# Patient Record
Sex: Female | Born: 2000
Health system: Southern US, Community
[De-identification: ages and names within clinical notes are randomized; demographics above are authoritative.]

## PROBLEM LIST (undated history)

## (undated) DIAGNOSIS — K5900 Constipation, unspecified: Secondary | ICD-10-CM

## (undated) DIAGNOSIS — K219 Gastro-esophageal reflux disease without esophagitis: Secondary | ICD-10-CM

## (undated) DIAGNOSIS — F32A Depression, unspecified: Secondary | ICD-10-CM

## (undated) DIAGNOSIS — E119 Type 2 diabetes mellitus without complications: Secondary | ICD-10-CM

## (undated) DIAGNOSIS — F419 Anxiety disorder, unspecified: Secondary | ICD-10-CM

## (undated) HISTORY — DX: Type 2 diabetes mellitus without complications: E11.9

## (undated) HISTORY — PX: MOUTH SURGERY: SHX715

## (undated) HISTORY — PX: ADENOIDECTOMY: SUR15

## (undated) HISTORY — PX: TONSILLECTOMY: SUR1361

---
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
## 2018-01-06 ENCOUNTER — Encounter: Payer: Self-pay | Admitting: Orthopedic Surgery

## 2018-05-21 ENCOUNTER — Ambulatory Visit (INDEPENDENT_AMBULATORY_CARE_PROVIDER_SITE_OTHER): Payer: BLUE CROSS/BLUE SHIELD | Admitting: Physician Assistant

## 2018-05-21 ENCOUNTER — Encounter: Payer: Self-pay | Admitting: Physician Assistant

## 2018-05-21 VITALS — BP 122/80 | HR 108 | Temp 98.0°F | Ht 67.02 in | Wt 206.6 lb

## 2018-05-21 DIAGNOSIS — E118 Type 2 diabetes mellitus with unspecified complications: Secondary | ICD-10-CM | POA: Diagnosis not present

## 2018-05-21 DIAGNOSIS — G43909 Migraine, unspecified, not intractable, without status migrainosus: Secondary | ICD-10-CM | POA: Insufficient documentation

## 2018-05-21 DIAGNOSIS — Z202 Contact with and (suspected) exposure to infections with a predominantly sexual mode of transmission: Secondary | ICD-10-CM | POA: Diagnosis not present

## 2018-05-21 DIAGNOSIS — R739 Hyperglycemia, unspecified: Secondary | ICD-10-CM | POA: Diagnosis not present

## 2018-05-21 DIAGNOSIS — G43809 Other migraine, not intractable, without status migrainosus: Secondary | ICD-10-CM | POA: Diagnosis not present

## 2018-05-21 DIAGNOSIS — K219 Gastro-esophageal reflux disease without esophagitis: Secondary | ICD-10-CM

## 2018-05-21 HISTORY — DX: Migraine, unspecified, not intractable, without status migrainosus: G43.909

## 2018-05-21 LAB — BAYER DCA HB A1C WAIVED: HB A1C (BAYER DCA - WAIVED): 6.9 % (ref <7.0)

## 2018-05-21 MED ORDER — RANITIDINE HCL 150 MG PO TABS: 150.0000 mg | ORAL_TABLET | Freq: Two times a day (BID) | ORAL | 11 refills | Status: DC

## 2018-05-21 MED ORDER — SUMATRIPTAN SUCCINATE 50 MG PO TABS: 50.0000 mg | ORAL_TABLET | ORAL | 1 refills | Status: DC | PRN

## 2018-05-22 LAB — CMP14+EGFR
ALT: 16 IU/L (ref 0–32)
AST: 21 IU/L (ref 0–40)
Albumin/Globulin Ratio: 2 (ref 1.2–2.2)
Albumin: 5 g/dL (ref 3.5–5.5)
Alkaline Phosphatase: 92 IU/L (ref 43–101)
BUN/Creatinine Ratio: 10 (ref 9–23)
BUN: 9 mg/dL (ref 6–20)
Bilirubin Total: 0.2 mg/dL (ref 0.0–1.2)
CO2: 18 mmol/L — ABNORMAL LOW (ref 20–29)
Calcium: 9.8 mg/dL (ref 8.7–10.2)
Chloride: 100 mmol/L (ref 96–106)
Creatinine, Ser: 0.88 mg/dL (ref 0.57–1.00)
GFR calc Af Amer: 111 mL/min/{1.73_m2} (ref >59)
GFR calc non Af Amer: 96 mL/min/{1.73_m2} (ref >59)
Globulin, Total: 2.5 g/dL (ref 1.5–4.5)
Glucose: 167 mg/dL — ABNORMAL HIGH (ref 65–99)
Potassium: 4.6 mmol/L (ref 3.5–5.2)
Sodium: 138 mmol/L (ref 134–144)
Total Protein: 7.5 g/dL (ref 6.0–8.5)

## 2018-05-22 LAB — CBC WITH DIFFERENTIAL/PLATELET
Basophils Absolute: 0.1 10*3/uL (ref 0.0–0.2)
Basos: 1 %
EOS (ABSOLUTE): 0.3 10*3/uL (ref 0.0–0.4)
Eos: 2 %
Hematocrit: 42.6 % (ref 34.0–46.6)
Hemoglobin: 14 g/dL (ref 11.1–15.9)
Immature Grans (Abs): 0 10*3/uL (ref 0.0–0.1)
Immature Granulocytes: 0 %
Lymphocytes Absolute: 4.4 10*3/uL — ABNORMAL HIGH (ref 0.7–3.1)
Lymphs: 33 %
MCH: 26.9 pg (ref 26.6–33.0)
MCHC: 32.9 g/dL (ref 31.5–35.7)
MCV: 82 fL (ref 79–97)
Monocytes Absolute: 1 10*3/uL — ABNORMAL HIGH (ref 0.1–0.9)
Monocytes: 8 %
Neutrophils Absolute: 7.5 10*3/uL — ABNORMAL HIGH (ref 1.4–7.0)
Neutrophils: 56 %
Platelets: 289 10*3/uL (ref 150–450)
RBC: 5.21 x10E6/uL (ref 3.77–5.28)
RDW: 15.6 % — ABNORMAL HIGH (ref 12.3–15.4)
WBC: 13.3 10*3/uL — ABNORMAL HIGH (ref 3.4–10.8)

## 2018-05-22 LAB — STD SCREEN (8)
HIV Screen 4th Generation wRfx: NONREACTIVE
HSV 1 Glycoprotein G Ab, IgG: 1.69 index — ABNORMAL HIGH (ref 0.00–0.90)
HSV 2 IgG, Type Spec: <0.91 index (ref 0.00–0.90)
Hep A IgM: NEGATIVE
Hep B C IgM: NEGATIVE
Hep C Virus Ab: <0.1 s/co ratio (ref 0.0–0.9)
Hepatitis B Surface Ag: NEGATIVE
RPR Ser Ql: NONREACTIVE

## 2018-05-26 DIAGNOSIS — K219 Gastro-esophageal reflux disease without esophagitis: Secondary | ICD-10-CM | POA: Insufficient documentation

## 2018-05-26 DIAGNOSIS — Z202 Contact with and (suspected) exposure to infections with a predominantly sexual mode of transmission: Secondary | ICD-10-CM | POA: Insufficient documentation
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
## 2018-06-23 ENCOUNTER — Ambulatory Visit: Payer: BLUE CROSS/BLUE SHIELD | Admitting: Physician Assistant

## 2018-06-25 ENCOUNTER — Encounter: Payer: Self-pay | Admitting: Physician Assistant

## 2018-07-14 DIAGNOSIS — E119 Type 2 diabetes mellitus without complications: Secondary | ICD-10-CM | POA: Diagnosis not present

## 2018-07-14 DIAGNOSIS — E785 Hyperlipidemia, unspecified: Secondary | ICD-10-CM | POA: Diagnosis not present

## 2018-07-14 DIAGNOSIS — E1169 Type 2 diabetes mellitus with other specified complication: Secondary | ICD-10-CM | POA: Diagnosis not present

## 2018-07-14 DIAGNOSIS — Z23 Encounter for immunization: Secondary | ICD-10-CM | POA: Diagnosis not present

## 2018-07-22 ENCOUNTER — Ambulatory Visit: Payer: BLUE CROSS/BLUE SHIELD | Admitting: Physician Assistant

## 2018-07-23 ENCOUNTER — Encounter: Payer: Self-pay | Admitting: Physician Assistant

## 2018-09-09 ENCOUNTER — Other Ambulatory Visit: Payer: Self-pay | Admitting: Adult Health

## 2018-09-30 ENCOUNTER — Ambulatory Visit (INDEPENDENT_AMBULATORY_CARE_PROVIDER_SITE_OTHER): Payer: Medicaid Other | Admitting: Physician Assistant

## 2018-09-30 ENCOUNTER — Encounter: Payer: Self-pay | Admitting: Physician Assistant

## 2018-09-30 VITALS — BP 121/87 | HR 105 | Temp 99.4°F | Ht 67.04 in | Wt 215.0 lb

## 2018-09-30 DIAGNOSIS — N946 Dysmenorrhea, unspecified: Secondary | ICD-10-CM

## 2018-09-30 DIAGNOSIS — L732 Hidradenitis suppurativa: Secondary | ICD-10-CM

## 2018-09-30 DIAGNOSIS — G43809 Other migraine, not intractable, without status migrainosus: Secondary | ICD-10-CM

## 2018-09-30 DIAGNOSIS — K219 Gastro-esophageal reflux disease without esophagitis: Secondary | ICD-10-CM

## 2018-09-30 MED ORDER — DOXYCYCLINE HYCLATE 100 MG PO TABS: 100.0000 mg | ORAL_TABLET | Freq: Two times a day (BID) | ORAL | 5 refills | Status: DC

## 2018-09-30 MED ORDER — RIZATRIPTAN BENZOATE 5 MG PO TABS: 5.0000 mg | ORAL_TABLET | ORAL | 0 refills | Status: DC | PRN

## 2018-09-30 MED ORDER — NORETHINDRONE-ETH ESTRADIOL 1-35 MG-MCG PO TABS: 1.0000 | ORAL_TABLET | Freq: Every day | ORAL | 11 refills | Status: DC

## 2018-09-30 MED ORDER — OMEPRAZOLE 20 MG PO CPDR: 20.0000 mg | DELAYED_RELEASE_CAPSULE | Freq: Every day | ORAL | 5 refills | Status: DC

## 2018-09-30 NOTE — Patient Instructions (Addendum)
Breakfast: eggs 2-3 Or greek yogurt low fat DANNON 1 slice delightful Terrill Mohr bread  Lunch: 2 slice Terrill Mohr delightfully bread       Or Nature's Own Light 4 ounces chicken, [place], roast beef 1 slice Thin sliced cheese S[place]ento Mustard ok 1 piece of fruit  Supper: 6 ounces lean meat 2 cups raw/cooked veg or 1 cup pintos, corn lima  3 snacks 100 calories or less     Hidradenitis Suppurativa Hidradenitis suppurativa is a long-term (chronic) skin disease. It is similar to a severe form of acne, but it affects areas of the body where acne would be unusual, especially areas of the body where skin rubs against skin and becomes moist. These include:  Underarms.  Groin.  Genital area.  Buttocks.  Upper thighs.  Breasts. Hidradenitis suppurativa may start out as small lumps or pimples caused by blocked sweat glands or hair follicles. Pimples may develop into deep sores that break open (rupture) and drain pus. Over time, affected areas of skin may thicken and become scarred. This condition is rare and does not spread from person to person (non-contagious). What are the causes? The exact cause of this condition is not known. It may be related to:  Female and female hormones.  An overactive disease-fighting system (immune system). The immune system may over-react to blocked hair follicles or sweat glands and cause swelling and pus-filled sores. What increases the risk? You are more likely to develop this condition if you:  Are female.  Are 10-17 years old.  Have a family history of hidradenitis suppurativa.  Have a personal history of acne.  Are overweight.  Smoke.  Take the medicine lithium. What are the signs or symptoms? The first symptoms are usually painful bumps in the skin, similar to pimples. The condition may get worse over time (progress), or it may only cause mild symptoms. If the disease progresses, symptoms may include:  Skin bumps getting bigger and  growing deeper into the skin.  Bumps rupturing and draining pus.  Itchy, infected skin.  Skin getting thicker and scarred.  Tunnels under the skin (fistulas) where pus drains from a bump.  Pain during daily activities, such as pain during walking if your groin area is affected.  Emotional problems, such as stress or depression. This condition may affect your appearance and your ability or willingness to wear certain clothes or do certain activities. How is this diagnosed? This condition is diagnosed by a health care provider who specializes in skin diseases (dermatologist). You may be diagnosed based on:  Your symptoms and medical history.  A physical exam.  Testing a pus sample for infection.  Blood tests. How is this treated? Your treatment will depend on how severe your symptoms are. The same treatment will not work for everybody with this condition. You may need to try several treatments to find what works best for you. Treatment may include:  Cleaning and bandaging (dressing) your wounds as needed.  Lifestyle changes, such as new skin care routines.  Taking medicines, such as: ? Antibiotics. ? Acne medicines. ? Medicines to reduce the activity of the immune system. ? A diabetes medicine (metformin). ? Birth control pills, for women. ? Steroids to reduce swelling and pain.  Working with a mental health care provider, if you experience emotional distress due to this condition. If you have severe symptoms that do not get better with medicine, you may need surgery. Surgery may involve:  Using a laser to clear the skin and  remove hair follicles.  Opening and draining deep sores.  Removing the areas of skin that are diseased and scarred. Follow these instructions at home: Medicines   Take over-the-counter and prescription medicines only as told by your health care provider.  If you were prescribed an antibiotic medicine, take it as told by your health care provider.  Do not stop taking the antibiotic even if your condition improves. Skin care  If you have open wounds, cover them with a clean dressing as told by your health care provider. Keep wounds clean by washing them gently with soap and water when you bathe.  Do not shave the areas where you get hidradenitis suppurativa.  Do not wear deodorant.  Wear loose-fitting clothes.  Try to avoid getting overheated or sweaty. If you get sweaty or wet, change into clean, dry clothes as soon as you can.  To help relieve pain and itchiness, cover sore areas with a warm, clean washcloth (warm compress) for 5-10 minutes as often as needed.  If told by your health care provider, take a bleach bath twice a week: ? Fill your bathtub halfway with water. ? Pour in  cup of unscented household bleach. ? Soak in the tub for 5-10 minutes. ? Only soak from the neck down. Avoid water on your face and hair. ? Shower to rinse off the bleach from your skin. General instructions  Learn as much as you can about your disease so that you have an active role in your treatment. Work closely with your health care provider to find treatments that work for you.  If you are overweight, work with your health care provider to lose weight as recommended.  Do not use any products that contain nicotine or tobacco, such as cigarettes and e-cigarettes. If you need help quitting, ask your health care provider.  If you struggle with living with this condition, talk with your health care provider or work with a mental health care provider as recommended.  Keep all follow-up visits as told by your health care provider. This is important. Where to find more information  Hidradenitis Suppurativa Foundation, Inc.: https://www.hs-foundation.org/ Contact a health care provider if you have:  A flare-up of hidradenitis suppurativa.  A fever or chills.  Trouble controlling your symptoms at home.  Trouble doing your daily activities  because of your symptoms.  Trouble dealing with emotional problems related to your condition. Summary  Hidradenitis suppurativa is a long-term (chronic) skin disease. It is similar to a severe form of acne, but it affects areas of the body where acne would be unusual.  The first symptoms are usually painful bumps in the skin, similar to pimples. The condition may get worse over time (progress), or it may only cause mild symptoms.  If you have open wounds, cover them with a clean dressing as told by your health care provider. Keep wounds clean by washing them gently with soap and water when you bathe.  Besides skin care, treatment may include medicines, laser treatment, and surgery. This information is not intended to replace advice given to you by your health care provider. Make sure you discuss any questions you have with your health care provider. Document Released: 04/03/2004 Document Revised: 08/28/2017 Document Reviewed: 08/28/2017 Elsevier Interactive Patient Education  [employer].    .

## 2018-09-30 NOTE — Progress Notes (Signed)
BP 121/87   Pulse (!) 105   Temp 99.4 F (37.4 C) (Oral)   Ht 5' 7.04" (1.703 m)   Wt 215 lb (97.5 kg)   BMI 33.63 kg/m    Subjective:    Patient ID: Brittany Wade, female    DOB: 2000-09-10, 18 y.o.   MRN: [id number]  HPI: Brittany Wade is a 18 y.o. female presenting on 09/30/2018 for Recurrent Skin Infections and Heartburn  This patient comes in for multiple conditions including her heartburn that is uncontrolled.  She had been on Zantac.  She states it had not been and then when she saw it was a recall for it she decided to go ahead and stop it.  She has not been taking anything since.  We will change her over to omeprazole at this time.  Patient also is having recurrence of her boils between her legs and on her lower abdomen.  She has had to take antibiotics in the past.  She states that will get quite sore she will have them spread around for a while.  We have had a long conversation about hidradenitis suppurativa and that this is likely what she has and not just acneform boils.  She is also having irregular menstrual cycles even on this birth control pill.  She is having more pain in her lower abdomen.  We have discussed the need to go up and strands of her birth control and she is agreeing. Warned to use extra protection while on her antibiotic  Patient also tried Imitrex as migraine treatment.  However made her very nauseous when she took it so therefore she has not taken any more.  She is to keep a diary of her migraines and will plan to recheck her in 2 months.  In the meantime she is given Maxalt for treatment.  Past Medical History:  Diagnosis Date  . Constipation   . Diabetes mellitus without complication ([place])    Relevant past medical, surgical, family and social history reviewed and updated as indicated. Interim medical history since our last visit reviewed. Allergies and medications reviewed and updated. DATA REVIEWED: CHART IN EPIC  Family History reviewed  for pertinent findings.  Review of Systems  Constitutional: Negative.  Negative for activity change, fatigue and fever.  HENT: Negative.   Eyes: Negative.   Respiratory: Negative.  Negative for cough.   Cardiovascular: Negative.  Negative for chest pain.  Gastrointestinal: Negative.  Negative for abdominal pain.  Endocrine: Negative.   Genitourinary: Positive for menstrual problem and pelvic pain. Negative for dysuria.  Musculoskeletal: Negative.   Skin: Positive for color change, rash and wound.  Neurological: Positive for headaches.    Allergies as of 09/30/2018      Reactions   Codeine Hives      Medication List       Accurate as of September 30, 2018  2:23 PM. Always use your most recent med list.        doxycycline 100 MG tablet Commonly known as:  VIBRA-TABS Take 1 tablet (100 mg total) by mouth 2 (two) times daily. After 10 days, continue on 1 daily   FLOVENT HFA 44 MCG/ACT inhaler Generic drug:  fluticasone   metFORMIN 1000 MG tablet Commonly known as:  GLUCOPHAGE Take 1,000 mg by mouth daily.   norethindrone-ethinyl estradiol 1/35 tablet Commonly known as:  ORTHO-NOVUM, NORTREL,CYCLAFEM Take 1 tablet by mouth daily.   omeprazole 20 MG capsule Commonly known as:  PRILOSEC Take  1 capsule (20 mg total) by mouth daily.   PROAIR HFA 108 (90 Base) MCG/ACT inhaler Generic drug:  albuterol USE 2 PUFFS EVERY 6 HOURS AS NEEDED   rizatriptan 5 MG tablet Commonly known as:  MAXALT Take 1 tablet (5 mg total) by mouth as needed for migraine. May repeat in 2 hours if needed          Objective:    BP 121/87   Pulse (!) 105   Temp 99.4 F (37.4 C) (Oral)   Ht 5' 7.04" (1.703 m)   Wt 215 lb (97.5 kg)   BMI 33.63 kg/m   Allergies  Allergen Reactions  . Codeine Hives    Wt Readings from Last 3 Encounters:  09/30/18 215 lb (97.5 kg) (98 %, Z= 2.15)*  05/21/18 206 lb 9.6 oz (93.7 kg) (98 %, Z= 2.05)*  12/18/17 195 lb (88.5 kg) (97 %, Z= 1.90)*   * Growth  percentiles are based on CDC (Girls, 2-20 Years) data.    Physical Exam Constitutional:      Appearance: She is well-developed.  HENT:     Head: Normocephalic and atraumatic.     Right Ear: Tympanic membrane, ear canal and external ear normal.     Left Ear: Tympanic membrane, ear canal and external ear normal.     Nose: Nose normal. No rhinorrhea.     Mouth/Throat:     Pharynx: No oropharyngeal exudate or posterior oropharyngeal erythema.  Eyes:     Conjunctiva/sclera: Conjunctivae normal.     Pupils: Pupils are equal, round, and reactive to light.  Neck:     Musculoskeletal: Normal range of motion and neck supple.  Cardiovascular:     Rate and Rhythm: Normal rate and regular rhythm.     Heart sounds: Normal heart sounds.  Pulmonary:     Effort: Pulmonary effort is normal.     Breath sounds: Normal breath sounds.  Abdominal:     General: Bowel sounds are normal.     Palpations: Abdomen is soft.  Skin:    General: Skin is warm and dry.     Findings: Abscess present. No rash.       Neurological:     Mental Status: She is alert and oriented to person, place, and time.     Deep Tendon Reflexes: Reflexes are normal and symmetric.  Psychiatric:        Behavior: Behavior normal.        Thought Content: Thought content normal.        Judgment: Judgment normal.     Results for orders placed or performed in visit on 05/21/18  STD Screen (8)  Result Value Ref Range   Hep A IgM Negative Negative   Hepatitis B Surface Ag Negative Negative   Hep B C IgM Negative Negative   Hep C Virus Ab <0.1 0.0 - 0.9 s/co ratio   RPR Ser Ql Non Reactive Non Reactive   HIV Screen 4th Generation wRfx Non Reactive Non Reactive   HSV 1 Glycoprotein G Ab, IgG 1.69 (H) 0.00 - 0.90 index   HSV 2 IgG, Type Spec <0.91 0.00 - 0.90 index  CBC with Differential/Platelet  Result Value Ref Range   WBC 13.3 (H) 3.4 - 10.8 x10E3/uL   RBC 5.21 3.77 - 5.28 x10E6/uL   Hemoglobin 14.0 11.1 - 15.9 g/dL    Hematocrit 42.6 34.0 - 46.6 %   MCV 82 79 - 97 fL   MCH 26.9 26.6 - 33.0 pg  MCHC 32.9 31.5 - 35.7 g/dL   RDW 15.6 (H) 12.3 - 15.4 %   Platelets 289 150 - 450 x10E3/uL   Neutrophils 56 Not Estab. %   Lymphs 33 Not Estab. %   Monocytes 8 Not Estab. %   Eos 2 Not Estab. %   Basos 1 Not Estab. %   Neutrophils Absolute 7.5 (H) 1.4 - 7.0 x10E3/uL   Lymphocytes Absolute 4.4 (H) 0.7 - 3.1 x10E3/uL   Monocytes Absolute 1.0 (H) 0.1 - 0.9 x10E3/uL   EOS (ABSOLUTE) 0.3 0.0 - 0.4 x10E3/uL   Basophils Absolute 0.1 0.0 - 0.2 x10E3/uL   Immature Granulocytes 0 Not Estab. %   Immature Grans (Abs) 0.0 0.0 - 0.1 x10E3/uL  CMP14+EGFR  Result Value Ref Range   Glucose 167 (H) 65 - 99 mg/dL   BUN 9 6 - 20 mg/dL   Creatinine, Ser 0.88 0.57 - 1.00 mg/dL   GFR calc non Af Amer 96 >59 mL/min/1.73   GFR calc Af Amer 111 >59 mL/min/1.73   BUN/Creatinine Ratio 10 9 - 23   Sodium 138 134 - 144 mmol/L   Potassium 4.6 3.5 - 5.2 mmol/L   Chloride 100 96 - 106 mmol/L   CO2 18 (L) 20 - 29 mmol/L   Calcium 9.8 8.7 - 10.2 mg/dL   Total Protein 7.5 6.0 - 8.5 g/dL   Albumin 5.0 3.5 - 5.5 g/dL   Globulin, Total 2.5 1.5 - 4.5 g/dL   Albumin/Globulin Ratio 2.0 1.2 - 2.2   Bilirubin Total 0.2 0.0 - 1.2 mg/dL   Alkaline Phosphatase 92 43 - 101 IU/L   AST 21 0 - 40 IU/L   ALT 16 0 - 32 IU/L  Bayer DCA Hb A1c Waived  Result Value Ref Range   HB A1C (BAYER DCA - WAIVED) 6.9 <7.0 %      Assessment & Plan:   1. Gastroesophageal reflux disease without esophagitis - omeprazole (PRILOSEC) 20 MG capsule; Take 1 capsule (20 mg total) by mouth daily.  Dispense: 30 capsule; Refill: 5  2. Other migraine without status migrainosus, not intractable - rizatriptan (MAXALT) 5 MG tablet; Take 1 tablet (5 mg total) by mouth as needed for migraine. May repeat in 2 hours if needed  Dispense: 10 tablet; Refill: 0  3. Hidradenitis suppurativa - doxycycline (VIBRA-TABS) 100 MG tablet; Take 1 tablet (100 mg total) by mouth 2 (two)  times daily. After 10 days, continue on 1 daily  Dispense: 40 tablet; Refill: 5  4. Dysmenorrhea - norethindrone-ethinyl estradiol 1/35 (ORTHO-NOVUM, NORTREL,CYCLAFEM) tablet; Take 1 tablet by mouth daily.  Dispense: 1 Package; Refill: 11   Continue all other maintenance medications as listed above.  Follow up plan: Return in about 2 months (around 11/29/2018).  Educational handout given for hidradenitis suppurativa and 1500-calorie diet.  Terald Sleeper PA-C [facility] [address]  [place], [place] [address] [phone]   09/30/2018, 2:23 PM

## 2018-11-11 ENCOUNTER — Other Ambulatory Visit: Payer: Self-pay | Admitting: Physician Assistant

## 2019-03-05 ENCOUNTER — Other Ambulatory Visit: Payer: Self-pay | Admitting: Physician Assistant

## 2019-04-07 ENCOUNTER — Other Ambulatory Visit: Payer: Self-pay | Admitting: Physician Assistant

## 2019-04-07 DIAGNOSIS — K219 Gastro-esophageal reflux disease without esophagitis: Secondary | ICD-10-CM

## 2019-04-08 ENCOUNTER — Other Ambulatory Visit: Payer: Self-pay | Admitting: Physician Assistant

## 2019-04-28 ENCOUNTER — Ambulatory Visit: Payer: Medicaid Other | Admitting: Physician Assistant

## 2019-06-02 ENCOUNTER — Other Ambulatory Visit: Payer: Self-pay | Admitting: Physician Assistant

## 2019-07-17 ENCOUNTER — Other Ambulatory Visit: Payer: Self-pay | Admitting: Physician Assistant

## 2019-07-28 DIAGNOSIS — Z20828 Contact with and (suspected) exposure to other viral communicable diseases: Secondary | ICD-10-CM | POA: Diagnosis not present

## 2019-10-26 ENCOUNTER — Other Ambulatory Visit: Payer: Self-pay | Admitting: Physician Assistant

## 2019-11-02 ENCOUNTER — Other Ambulatory Visit: Payer: Self-pay | Admitting: Physician Assistant

## 2019-11-02 DIAGNOSIS — N946 Dysmenorrhea, unspecified: Secondary | ICD-10-CM

## 2019-11-02 DIAGNOSIS — L732 Hidradenitis suppurativa: Secondary | ICD-10-CM

## 2020-03-22 ENCOUNTER — Telehealth: Payer: Self-pay | Admitting: Nurse Practitioner

## 2020-03-22 NOTE — Telephone Encounter (Signed)
Patient needs to be seen.

## 2020-03-25 ENCOUNTER — Other Ambulatory Visit (HOSPITAL_COMMUNITY)
Admission: RE | Admit: 2020-03-25 | Discharge: 2020-03-25 | Disposition: A | Discharge status: Home / Self Care | Payer: BC Managed Care – PPO | Source: Ambulatory Visit | Attending: Family Medicine | Admitting: Family Medicine

## 2020-03-25 ENCOUNTER — Other Ambulatory Visit: Payer: Self-pay

## 2020-03-25 ENCOUNTER — Encounter: Payer: Self-pay | Admitting: Family Medicine

## 2020-03-25 ENCOUNTER — Ambulatory Visit (INDEPENDENT_AMBULATORY_CARE_PROVIDER_SITE_OTHER): Payer: BC Managed Care – PPO | Admitting: Family Medicine

## 2020-03-25 VITALS — BP 125/81 | HR 84 | Temp 97.4°F | Ht 67.04 in | Wt 189.4 lb

## 2020-03-25 DIAGNOSIS — K219 Gastro-esophageal reflux disease without esophagitis: Secondary | ICD-10-CM | POA: Diagnosis not present

## 2020-03-25 DIAGNOSIS — E118 Type 2 diabetes mellitus with unspecified complications: Secondary | ICD-10-CM

## 2020-03-25 DIAGNOSIS — Z113 Encounter for screening for infections with a predominantly sexual mode of transmission: Secondary | ICD-10-CM

## 2020-03-25 LAB — BAYER DCA HB A1C WAIVED: HB A1C (BAYER DCA - WAIVED): 8.9 % — ABNORMAL HIGH (ref <7.0)

## 2020-03-25 MED ORDER — ATORVASTATIN CALCIUM 10 MG PO TABS: 10.0000 mg | ORAL_TABLET | Freq: Every day | ORAL | 2 refills | Status: DC

## 2020-03-25 MED ORDER — OMEPRAZOLE 20 MG PO CPDR: DELAYED_RELEASE_CAPSULE | ORAL | 1 refills | Status: DC

## 2020-03-25 MED ORDER — METFORMIN HCL 1000 MG PO TABS: 1000.0000 mg | ORAL_TABLET | Freq: Two times a day (BID) | ORAL | 1 refills | Status: DC

## 2020-03-25 NOTE — Patient Instructions (Signed)

## 2020-03-25 NOTE — Progress Notes (Signed)
Assessment & Plan:  1. Type 2 diabetes mellitus with complication, without long-term current use of insulin (HCC) Lab Results  Component Value Date   HGBA1C 6.9 05/21/2018  - Medications: continue current medications, at least until A1c results - Home glucose monitoring: continue monitoring - Patient is currently taking a statin. Patient is not taking an ACE-inhibitor/ARB.  - Urine Microalbumin/Creat Ratio: today - Instruction/counseling given: discussed diet and provided printed educational material - metFORMIN (GLUCOPHAGE) 1000 MG tablet; Take 1 tablet (1,000 mg total) by mouth 2 (two) times daily with a meal.  Dispense: 180 tablet; Refill: 1 - Microalbumin / creatinine urine ratio - Bayer DCA Hb A1c Waived - CBC with Differential/Platelet - CMP14+EGFR - Lipid panel - atorvastatin (LIPITOR) 10 MG tablet; Take 1 tablet (10 mg total) by mouth daily.  Dispense: 30 tablet; Refill: 2  2. Gastroesophageal reflux disease without esophagitis - Well controlled on current regimen.  - omeprazole (PRILOSEC) 20 MG capsule; TAKE ONE (1) CAPSULE EACH DAY  Dispense: 90 capsule; Refill: 1  3. Routine screening for STI (sexually transmitted infection) - GC/Chlamydia probe amp ([place])not at [facility]   Return in about 3 months (around 06/25/2020) for DM.  Hendricks Limes, MSN, APRN, FNP-C [facility]  Subjective:    Patient ID: Brittany Wade, female    DOB: [birth year], 19 y.o.   MRN: [id number]  Patient Care Team: Theodoro Clock as PCP - General (Physician Assistant)   Chief Complaint:  Chief Complaint  Patient presents with  . Establish Care    Jones pt   . Diabetes    check up of chronic medical conditions    HPI: Brittany Wade is a 19 y.o. female presenting on 03/25/2020 for Establish Care Ronnald Ramp pt ) and Diabetes (check up of chronic medical conditions)  Diabetes: Patient presents for follow up of diabetes. Current symptoms include: hyperglycemia.  Symptoms have gradually worsened. Known diabetic complications: none. Medication compliance: yes. Current diet: in general, a "healthy" diet  . Current exercise: walking. Home blood sugar records: 150-215 fasting. Is she  on ACE inhibitor or angiotensin II receptor blocker? No. Is she on a statin? No.   Lab Results  Component Value Date   HGBA1C 6.9 05/21/2018   Lab Results  Component Value Date   CREATININE 0.88 05/21/2018     GERD controlled with omeprazole.   New complaints: None  Social history:  Relevant past medical, surgical, family and social history reviewed and updated as indicated. Interim medical history since our last visit reviewed.  Allergies and medications reviewed and updated.  DATA REVIEWED: CHART IN EPIC  ROS: Negative unless specifically indicated above in HPI.    Current Outpatient Medications:  .  albuterol (VENTOLIN HFA) 108 (90 Base) MCG/ACT inhaler, USE 2 PUFFS EVERY 6 HOURS AS NEEDED, Disp: 8.5 g, Rfl: 0 .  FLOVENT HFA 44 MCG/ACT inhaler, INHALE 2 PUFFS TWICE DAILY, Disp: 10.6 g, Rfl: 0 .  metFORMIN (GLUCOPHAGE) 1000 MG tablet, Take 1 tablet (1,000 mg total) by mouth 2 (two) times daily with a meal. Must be seen, Disp: 60 tablet, Rfl: 0 .  omeprazole (PRILOSEC) 20 MG capsule, TAKE ONE (1) CAPSULE EACH DAY, Disp: 30 capsule, Rfl: 5   Allergies  Allergen Reactions  . Codeine Hives   Past Medical History:  Diagnosis Date  . Constipation   . Diabetes mellitus without complication [facility])     Past Surgical History:  Procedure Laterality Date  . ADENOIDECTOMY    .  MOUTH SURGERY    . TONSILLECTOMY      Social History   Socioeconomic History  . Marital status: Single    Spouse name: Not on file  . Number of children: Not on file  . Years of education: Not on file  . Highest education level: Not on file  Occupational History  . Not on file  Tobacco Use  . Smoking status: Current Every Day Smoker    Packs/day: 0.50    Years: 4.00    Pack  years: 2.00    Types: Cigarettes  . Smokeless tobacco: Never Used  Vaping Use  . Vaping Use: Never used  Substance and Sexual Activity  . Alcohol use: No  . Drug use: No  . Sexual activity: Yes    Birth control/protection: Pill  Other Topics Concern  . Not on file  Social History Narrative  . Not on file   Social Determinants of Health   Financial Resource Strain:   . Difficulty of Paying Living Expenses:   Food Insecurity:   . Worried About Charity fundraiser in the Last Year:   . Arboriculturist in the Last Year:   Transportation Needs:   . Film/video editor (Medical):   Marland Kitchen Lack of Transportation (Non-Medical):   Physical Activity:   . Days of Exercise per Week:   . Minutes of Exercise per Session:   Stress:   . Feeling of Stress :   Social Connections:   . Frequency of Communication with Friends and Family:   . Frequency of Social Gatherings with Friends and Family:   . Attends Religious Services:   . Active Member of Clubs or Organizations:   . Attends Archivist Meetings:   Marland Kitchen Marital Status:   Intimate Partner Violence:   . Fear of Current or Ex-Partner:   . Emotionally Abused:   Marland Kitchen Physically Abused:   . Sexually Abused:         Objective:    BP 125/81   Pulse 84   Temp (!) 97.4 F (36.3 C) (Temporal)   Ht 5' 7.04" (1.703 m)   Wt 189 lb 6.4 oz (85.9 kg)   LMP 03/11/2020   SpO2 97%   BMI 29.63 kg/m   Wt Readings from Last 3 Encounters:  03/25/20 189 lb 6.4 oz (85.9 kg)  09/30/18 215 lb (97.5 kg) (98 %, Z= 2.15)*  05/21/18 206 lb 9.6 oz (93.7 kg) (98 %, Z= 2.05)*   * Growth percentiles are based on CDC (Girls, 2-20 Years) data.    Physical Exam Vitals reviewed.  Constitutional:      General: She is not in acute distress.    Appearance: Normal appearance. She is overweight. She is not ill-appearing, toxic-appearing or diaphoretic.  HENT:     Head: Normocephalic and atraumatic.  Eyes:     General: No scleral icterus.        Right eye: No discharge.        Left eye: No discharge.     Conjunctiva/sclera: Conjunctivae normal.  Cardiovascular:     Rate and Rhythm: Normal rate and regular rhythm.     Heart sounds: Normal heart sounds. No murmur heard.  No friction rub. No gallop.   Pulmonary:     Effort: Pulmonary effort is normal. No respiratory distress.     Breath sounds: No stridor. Wheezing present. No rhonchi or rales.  Musculoskeletal:        General: Normal range of motion.  Cervical back: Normal range of motion.  Skin:    General: Skin is warm and dry.     Capillary Refill: Capillary refill takes less than 2 seconds.  Neurological:     General: No focal deficit present.     Mental Status: She is alert and oriented to person, place, and time. Mental status is at baseline.  Psychiatric:        Mood and Affect: Mood normal.        Behavior: Behavior normal.        Thought Content: Thought content normal.        Judgment: Judgment normal.    No results found for: TSH Lab Results  Component Value Date   WBC 13.3 (H) 05/21/2018   HGB 14.0 05/21/2018   HCT 42.6 05/21/2018   MCV 82 05/21/2018   PLT 289 05/21/2018   Lab Results  Component Value Date   NA 138 05/21/2018   K 4.6 05/21/2018   CO2 18 (L) 05/21/2018   GLUCOSE 167 (H) 05/21/2018   BUN 9 05/21/2018   CREATININE 0.88 05/21/2018   BILITOT 0.2 05/21/2018   ALKPHOS 92 05/21/2018   AST 21 05/21/2018   ALT 16 05/21/2018   PROT 7.5 05/21/2018   ALBUMIN 5.0 05/21/2018   CALCIUM 9.8 05/21/2018   ANIONGAP 5 10/11/2014   No results found for: CHOL No results found for: HDL No results found for: LDLCALC No results found for: TRIG No results found for: [facility] Lab Results  Component Value Date   HGBA1C 6.9 05/21/2018

## 2020-03-26 LAB — CMP14+EGFR
ALT: 16 IU/L (ref 0–32)
AST: 16 IU/L (ref 0–40)
Albumin/Globulin Ratio: 2 (ref 1.2–2.2)
Albumin: 4.6 g/dL (ref 3.9–5.0)
Alkaline Phosphatase: 95 IU/L (ref 45–106)
BUN/Creatinine Ratio: 15 (ref 9–23)
BUN: 10 mg/dL (ref 6–20)
Bilirubin Total: <0.2 mg/dL (ref 0.0–1.2)
CO2: 20 mmol/L (ref 20–29)
Calcium: 10 mg/dL (ref 8.7–10.2)
Chloride: 101 mmol/L (ref 96–106)
Creatinine, Ser: 0.66 mg/dL (ref 0.57–1.00)
GFR calc Af Amer: 147 mL/min/{1.73_m2} (ref >59)
GFR calc non Af Amer: 128 mL/min/{1.73_m2} (ref >59)
Globulin, Total: 2.3 g/dL (ref 1.5–4.5)
Glucose: 252 mg/dL — ABNORMAL HIGH (ref 65–99)
Potassium: 4.7 mmol/L (ref 3.5–5.2)
Sodium: 138 mmol/L (ref 134–144)
Total Protein: 6.9 g/dL (ref 6.0–8.5)

## 2020-03-26 LAB — LIPID PANEL
Chol/HDL Ratio: 5.8 ratio — ABNORMAL HIGH (ref 0.0–4.4)
Cholesterol, Total: 180 mg/dL (ref 100–199)
HDL: 31 mg/dL — ABNORMAL LOW (ref >39)
LDL Chol Calc (NIH): 101 mg/dL — ABNORMAL HIGH (ref 0–99)
Triglycerides: 280 mg/dL — ABNORMAL HIGH (ref 0–149)
VLDL Cholesterol Cal: 48 mg/dL — ABNORMAL HIGH (ref 5–40)

## 2020-03-26 LAB — CBC WITH DIFFERENTIAL/PLATELET
Basophils Absolute: 0.1 10*3/uL (ref 0.0–0.2)
Basos: 1 %
EOS (ABSOLUTE): 1 10*3/uL — ABNORMAL HIGH (ref 0.0–0.4)
Eos: 8 %
Hematocrit: 37.2 % (ref 34.0–46.6)
Hemoglobin: 11.5 g/dL (ref 11.1–15.9)
Immature Grans (Abs): 0 10*3/uL (ref 0.0–0.1)
Immature Granulocytes: 0 %
Lymphocytes Absolute: 5.2 10*3/uL — ABNORMAL HIGH (ref 0.7–3.1)
Lymphs: 40 %
MCH: 25.7 pg — ABNORMAL LOW (ref 26.6–33.0)
MCHC: 30.9 g/dL — ABNORMAL LOW (ref 31.5–35.7)
MCV: 83 fL (ref 79–97)
Monocytes Absolute: 0.9 10*3/uL (ref 0.1–0.9)
Monocytes: 7 %
Neutrophils Absolute: 5.7 10*3/uL (ref 1.4–7.0)
Neutrophils: 44 %
Platelets: 404 10*3/uL (ref 150–450)
RBC: 4.48 x10E6/uL (ref 3.77–5.28)
RDW: 15.7 % — ABNORMAL HIGH (ref 11.7–15.4)
WBC: 12.9 10*3/uL — ABNORMAL HIGH (ref 3.4–10.8)

## 2020-03-26 LAB — MICROALBUMIN / CREATININE URINE RATIO
Creatinine, Urine: 122.9 mg/dL
Microalb/Creat Ratio: 14 mg/g creat (ref 0–29)
Microalbumin, Urine: 16.6 ug/mL

## 2020-03-28 LAB — GC/CHLAMYDIA PROBE AMP (~~LOC~~) NOT AT ARMC
Chlamydia: NEGATIVE
Comment: NEGATIVE
Comment: NORMAL
Neisseria Gonorrhea: NEGATIVE

## 2020-04-07 ENCOUNTER — Encounter: Payer: Self-pay | Admitting: Pharmacist

## 2020-04-07 ENCOUNTER — Other Ambulatory Visit: Payer: Self-pay

## 2020-04-07 ENCOUNTER — Ambulatory Visit (INDEPENDENT_AMBULATORY_CARE_PROVIDER_SITE_OTHER): Payer: BC Managed Care – PPO | Admitting: Pharmacist

## 2020-04-07 DIAGNOSIS — E1122 Type 2 diabetes mellitus with diabetic chronic kidney disease: Secondary | ICD-10-CM

## 2020-04-07 DIAGNOSIS — N183 Chronic kidney disease, stage 3 unspecified: Secondary | ICD-10-CM | POA: Diagnosis not present

## 2020-04-07 MED ORDER — TRULICITY 0.75 MG/0.5ML ~~LOC~~ SOAJ
0.7500 mg | SUBCUTANEOUS | 1 refills | Status: DC
Start: 2020-04-07 — End: 2020-09-05

## 2020-04-07 NOTE — Progress Notes (Signed)
    04/07/2020 Name: Brittany Wade MRN: [id number] DOB: Nov 04, 2000   S:  18 yoF presents for diabetes evaluation, education, and management Patient was referred and last seen by Primary Care Provider on 03/25/20.  Insurance coverage/medication affordability: BCBS healthy blue medicaid   Patient reports adherence with medications. . Current diabetes medications include: metformin . Current hypertension medications include: n/a Goal 130/80 . Current hyperlipidemia medications include: atorvastatin  Patient denies hypoglycemic events.   Discussed meal planning options and Plate method for healthy eating . Avoid sugary drinks and desserts . Incorporate balanced protein, non starchy veggies, 1 serving of carbohydrate with each meal . Increase water intake . Increase physical activity as able  Patient-reported exercise habits: n/a  O:  Lab Results  Component Value Date   HGBA1C 8.9 (H) 03/25/2020    Lipid Panel     Component Value Date/Time   CHOL 180 03/25/2020 0832   TRIG 280 (H) 03/25/2020 0832   HDL 31 (L) 03/25/2020 0832   CHOLHDL 5.8 (H) 03/25/2020 0832   LDLCALC 101 (H) 03/25/2020 0832     Home fasting blood sugars: 130-140, reports sugars are running higher around 200  A/P:  Diabetes T2DM currently uncontrolled. Patient is able to verbalize appropriate hypoglycemia management plan. Patient is adherent with medication. Control is suboptimal due to medications not optimized.  -Started GLP-1 TRULICITY (generic name dulaglutide) 0.75mg  sq weekly  Patient was provided information on medication and side effects (brochure given)  Injection was started in office   RX sent to PCP for cosign   PA likely required for this medication  Patient denies history of thyroid/medullary cancer  Plan to increase Trulicity to 1.5mg  as tolerated and as needed  -Continue metformin  -Extensively discussed pathophysiology of diabetes, recommended lifestyle interventions, dietary  effects on blood sugar control  -Counseled on s/sx of and management of hypoglycemia  -Next A1C anticipated 3 months.   Written patient instructions provided.  Total time in face to face counseling 30 minutes.   Follow up PCP Clinic Visit ON 06/30/20.    Kieth Brightly, PharmD, BCPS Clinical Pharmacist, [facility]  II Phone [phone]

## 2020-04-13 DIAGNOSIS — L0231 Cutaneous abscess of buttock: Secondary | ICD-10-CM | POA: Diagnosis not present

## 2020-06-20 DIAGNOSIS — K0889 Other specified disorders of teeth and supporting structures: Secondary | ICD-10-CM | POA: Diagnosis not present

## 2020-06-20 DIAGNOSIS — Z7984 Long term (current) use of oral hypoglycemic drugs: Secondary | ICD-10-CM | POA: Diagnosis not present

## 2020-06-20 DIAGNOSIS — K047 Periapical abscess without sinus: Secondary | ICD-10-CM | POA: Diagnosis not present

## 2020-06-20 DIAGNOSIS — Z885 Allergy status to narcotic agent status: Secondary | ICD-10-CM | POA: Diagnosis not present

## 2020-06-20 DIAGNOSIS — Z79899 Other long term (current) drug therapy: Secondary | ICD-10-CM | POA: Diagnosis not present

## 2020-06-29 ENCOUNTER — Ambulatory Visit: Payer: BC Managed Care – PPO | Admitting: Family Medicine

## 2020-06-30 ENCOUNTER — Ambulatory Visit (INDEPENDENT_AMBULATORY_CARE_PROVIDER_SITE_OTHER): Payer: BC Managed Care – PPO | Admitting: Family Medicine

## 2020-06-30 ENCOUNTER — Other Ambulatory Visit: Payer: Self-pay

## 2020-06-30 ENCOUNTER — Encounter: Payer: Self-pay | Admitting: Family Medicine

## 2020-06-30 ENCOUNTER — Ambulatory Visit: Payer: BC Managed Care – PPO | Admitting: Family Medicine

## 2020-06-30 VITALS — BP 105/70 | HR 98 | Temp 98.4°F | Ht 67.0 in | Wt 184.5 lb

## 2020-06-30 DIAGNOSIS — Z09 Encounter for follow-up examination after completed treatment for conditions other than malignant neoplasm: Secondary | ICD-10-CM

## 2020-06-30 DIAGNOSIS — E785 Hyperlipidemia, unspecified: Secondary | ICD-10-CM | POA: Diagnosis not present

## 2020-06-30 DIAGNOSIS — E118 Type 2 diabetes mellitus with unspecified complications: Secondary | ICD-10-CM

## 2020-06-30 DIAGNOSIS — R609 Edema, unspecified: Secondary | ICD-10-CM

## 2020-06-30 DIAGNOSIS — E1169 Type 2 diabetes mellitus with other specified complication: Secondary | ICD-10-CM | POA: Diagnosis not present

## 2020-06-30 LAB — CMP14+EGFR
ALT: 11 IU/L (ref 0–32)
AST: 11 IU/L (ref 0–40)
Albumin/Globulin Ratio: 1.9 (ref 1.2–2.2)
Albumin: 4.3 g/dL (ref 3.9–5.0)
Alkaline Phosphatase: 75 IU/L (ref 42–106)
BUN/Creatinine Ratio: 17 (ref 9–23)
BUN: 11 mg/dL (ref 6–20)
Bilirubin Total: 0.2 mg/dL (ref 0.0–1.2)
CO2: 21 mmol/L (ref 20–29)
Calcium: 9.2 mg/dL (ref 8.7–10.2)
Chloride: 104 mmol/L (ref 96–106)
Creatinine, Ser: 0.65 mg/dL (ref 0.57–1.00)
GFR calc Af Amer: 148 mL/min/{1.73_m2} (ref >59)
GFR calc non Af Amer: 128 mL/min/{1.73_m2} (ref >59)
Globulin, Total: 2.3 g/dL (ref 1.5–4.5)
Glucose: 242 mg/dL — ABNORMAL HIGH (ref 65–99)
Potassium: 4.4 mmol/L (ref 3.5–5.2)
Sodium: 139 mmol/L (ref 134–144)
Total Protein: 6.6 g/dL (ref 6.0–8.5)

## 2020-06-30 LAB — LIPID PANEL
Chol/HDL Ratio: 5.5 ratio — ABNORMAL HIGH (ref 0.0–4.4)
Cholesterol, Total: 172 mg/dL (ref 100–199)
HDL: 31 mg/dL — ABNORMAL LOW (ref >39)
LDL Chol Calc (NIH): 114 mg/dL — ABNORMAL HIGH (ref 0–99)
Triglycerides: 148 mg/dL (ref 0–149)
VLDL Cholesterol Cal: 27 mg/dL (ref 5–40)

## 2020-06-30 LAB — BAYER DCA HB A1C WAIVED: HB A1C (BAYER DCA - WAIVED): 7.3 % — ABNORMAL HIGH (ref <7.0)

## 2020-06-30 MED ORDER — ATORVASTATIN CALCIUM 10 MG PO TABS: 10.0000 mg | ORAL_TABLET | Freq: Every day | ORAL | 2 refills | Status: DC

## 2020-06-30 MED ORDER — FUROSEMIDE 20 MG PO TABS: 20.0000 mg | ORAL_TABLET | ORAL | 3 refills | Status: DC | PRN

## 2020-06-30 NOTE — Progress Notes (Signed)
Subjective:  Patient ID: Brittany Wade, female    DOB: Jun 25, 2001  Age: 19 y.o. MRN: [id number]  CC: Medical Management of Chronic Issues   HPI Brittany Wade presents for Follow-up of diabetes.   Compliant with meds - taking glucophage [id number] daily, trulicity 3.38 mg some weeks, she has not taken her trulicity in 2-3 weeks.  Checking CBGs? Yes  Fasting avg - 130-160 Exercising regularly? - not really, she is active throughout the day and walks some Watching carbohydrate intake? - not really Neuropathy ? - some numbness in bilateral pinky toe on sides at times Hypoglycemic events - No  Pertinent ROS:  Polyuria - No Polydipsia - No Vision problems - No  She is taking a statin daily, although she has been out of this for the last week. She is tolerating medication well without side effects.   New concerns:  1. Edema Reve reports swelling in both lower legs and ankles off and on for last 4 years. The swelling continues to get worse. She works on concrete floors for long shifts. She wears compressions socks but they do not go over her legs. She reports swelling in her legs 4-5 days a week. She reports the swelling does improve overnight and with elevation. She has taken her grandmother's fluid pill before with good relief. She is interested in taking a fluid pill.  She denies chest pain, shortness of breath, dizziness, palpitations or headaches.   2. Hospital follow up Brittany Wade was seen in the ED on 06/20/20 for a dental abscess. She was given clindamycin and has completed the antibiotic. She has seen the dentist but has not scheduled an appointment yet with the oral surgeon as instructed.   History Brittany Wade has a past medical history of Constipation and Diabetes mellitus without complication ([place]).   She has a past surgical history that includes Adenoidectomy; Mouth surgery; and Tonsillectomy.   Her family history includes ADD / ADHD in her cousin and sister; Anxiety disorder in her  maternal grandmother and mother; Asperger's syndrome in her sister; Bipolar disorder in her mother; Depression in her maternal grandmother and mother; Epilepsy in an other family member; Mental illness in her mother; Migraines in her maternal grandmother and mother; Seizures in her mother.She reports that she has been smoking cigarettes. She has a 2.00 pack-year smoking history. She has never used smokeless tobacco. She reports that she does not drink alcohol and does not use drugs.  Current Outpatient Medications on File Prior to Visit  Medication Sig Dispense Refill  . albuterol (VENTOLIN HFA) 108 (90 Base) MCG/ACT inhaler USE 2 PUFFS EVERY 6 HOURS AS NEEDED 8.5 g 0  . clindamycin (CLEOCIN) 150 MG capsule Take by mouth.    . Dulaglutide (TRULICITY) 2.50 NL/9.7QB SOPN Inject 0.5 mLs (0.75 mg total) into the skin once a week. 2 mL 1  . metFORMIN (GLUCOPHAGE) 1000 MG tablet Take 1 tablet (1,000 mg total) by mouth 2 (two) times daily with a meal. 180 tablet 1  . naproxen (NAPROSYN) 500 MG tablet Take 500 mg by mouth 2 (two) times daily.    Marland Kitchen omeprazole (PRILOSEC) 20 MG capsule TAKE ONE (1) CAPSULE EACH DAY 90 capsule 1  . atorvastatin (LIPITOR) 10 MG tablet Take 1 tablet (10 mg total) by mouth daily. (Patient not taking: Reported on 06/30/2020) 30 tablet 2   No current facility-administered medications on file prior to visit.    Review of Systems  Per HPI.   Objective:  BP 105/70  Pulse 98   Temp 98.4 F (36.9 C) (Temporal)   Ht '5\' 7"'  (1.702 m)   Wt 184 lb 8 oz (83.7 kg)   BMI 28.90 kg/m   BP Readings from Last 3 Encounters:  06/30/20 105/70  03/25/20 125/81  09/30/18 121/87    Wt Readings from Last 3 Encounters:  06/30/20 184 lb 8 oz (83.7 kg)  03/25/20 189 lb 6.4 oz (85.9 kg)  09/30/18 215 lb (97.5 kg) (98 %, Z= 2.15)*   * Growth percentiles are based on CDC (Girls, 2-20 Years) data.    Physical Exam Vitals and nursing note reviewed.  Constitutional:      General: She is  not in acute distress.    Appearance: Normal appearance. She is not ill-appearing, toxic-appearing or diaphoretic.  HENT:     Head: Normocephalic and atraumatic.     Mouth/Throat:     Mouth: Mucous membranes are moist.     Dentition: Gingival swelling present.     Pharynx: Oropharynx is clear.     Tonsils: No tonsillar exudate or tonsillar abscesses.  Cardiovascular:     Rate and Rhythm: Normal rate and regular rhythm.     Pulses: Normal pulses.     Heart sounds: Normal heart sounds. No murmur heard.   Pulmonary:     Effort: Pulmonary effort is normal. No respiratory distress.     Breath sounds: Normal breath sounds.  Abdominal:     Palpations: Abdomen is soft.  Musculoskeletal:     Cervical back: Normal range of motion and neck supple.     Right lower leg: No edema.     Left lower leg: No edema.  Lymphadenopathy:     Cervical: No cervical adenopathy.  Skin:    General: Skin is warm and dry.  Neurological:     General: No focal deficit present.     Mental Status: She is alert and oriented to person, place, and time.  Psychiatric:        Mood and Affect: Mood normal.        Behavior: Behavior normal.     Lab Results  Component Value Date   HGBA1C 8.9 (H) 03/25/2020   HGBA1C 6.9 05/21/2018    Lab Results  Component Value Date   WBC 12.9 (H) 03/25/2020   HGB 11.5 03/25/2020   HCT 37.2 03/25/2020   PLT 404 03/25/2020   GLUCOSE 252 (H) 03/25/2020   CHOL 180 03/25/2020   TRIG 280 (H) 03/25/2020   HDL 31 (L) 03/25/2020   LDLCALC 101 (H) 03/25/2020   ALT 16 03/25/2020   AST 16 03/25/2020   NA 138 03/25/2020   K 4.7 03/25/2020   CL 101 03/25/2020   CREATININE 0.66 03/25/2020   BUN 10 03/25/2020   CO2 20 03/25/2020   HGBA1C 8.9 (H) 03/25/2020     Assessment & Plan:  Natale was seen today for medical management of chronic issues.  Diagnoses and all orders for this visit:  Type 2 diabetes mellitus with complication, without long-term current use of insulin  (HCC) A1C is 7.3 today. Goal is <7. Discussed setting a reminder in her phone weekly so that she remembers to take her Trulicity as prescribed. She will likely be below goal if she takes her Trulicity weekly. Normal foot exam today. Discussed diet and exercise.  -     Bayer DCA Hb A1c Waived -     CMP14+EGFR  Hyperlipidemia associated with type 2 diabetes mellitus ([place]) Dash diet handout given. Discussed  exercise. Will check lipid panel today. Continue statin as below.  -     atorvastatin (LIPITOR) 10 MG tablet; Take 1 tablet (10 mg total) by mouth daily. -     Lipid panel  Peripheral edema Discussed compression stockings and elevation. Handout given. Lasix added as needed. Patient will return for lab work in a month if she is taking Lasix frequently.  -     furosemide (LASIX) 20 MG tablet; Take 1 tablet (20 mg total) by mouth as needed for edema (for edema).  Hospital discharge follow-up Reviewed notes from ED. No abscess visualized on exam today. No facial or jaw swelling or tenderness. Stressed importance of scheduling appointment with Chief Financial Officer.   Follow-up:  3 months for chronic follow up.   The above assessment and management plan was discussed with the patient. The patient verbalized understanding of and has agreed to the management plan. Patient is aware to call the clinic if symptoms fail to improve or worsen. Patient is aware when to return to the clinic for a follow-up visit. Patient educated on when it is appropriate to go to the emergency department.   Marjorie Smolder, FNP-C [facility] [address] [place], [place] [address] [phone]

## 2020-06-30 NOTE — Patient Instructions (Signed)
Come back for lab work in about a month if taking Lasix frequently.  Peripheral Edema  Peripheral edema is swelling that is caused by a buildup of fluid. Peripheral edema most often affects the lower legs, ankles, and feet. It can also develop in the arms, hands, and face. The area of the body that has peripheral edema will look swollen. It may also feel heavy or warm. Your clothes may start to feel tight. Pressing on the area may make a temporary dent in your skin. You may not be able to move your swollen arm or leg as much as usual. There are many causes of peripheral edema. It can happen because of a complication of other conditions such as congestive heart failure, kidney disease, or a problem with your blood circulation. It also can be a side effect of certain medicines or because of an infection. It often happens to women during pregnancy. Sometimes, the cause is not known. Follow these instructions at home: Managing pain, stiffness, and swelling   Raise (elevate) your legs while you are sitting or lying down.  Move around often to prevent stiffness and to lessen swelling.  Do not sit or stand for long periods of time.  Wear support stockings as told by your health care provider. Medicines  Take over-the-counter and prescription medicines only as told by your health care provider.  Your health care provider may prescribe medicine to help your body get rid of excess water (diuretic). General instructions  Pay attention to any changes in your symptoms.  Follow instructions from your health care provider about limiting salt (sodium) in your diet. Sometimes, eating less salt may reduce swelling.  Moisturize skin daily to help prevent skin from cracking and draining.  Keep all follow-up visits as told by your health care provider. This is important. Contact a health care provider if you have:  A fever.  Edema that starts suddenly or is getting worse, especially if you are  pregnant or have a medical condition.  Swelling in only one leg.  Increased swelling, redness, or pain in one or both of your legs.  Drainage or sores at the area where you have edema. Get help right away if you:  Develop shortness of breath, especially when you are lying down.  Have pain in your chest or abdomen.  Feel weak.  Feel faint. Summary  Peripheral edema is swelling that is caused by a buildup of fluid. Peripheral edema most often affects the lower legs, ankles, and feet.  Move around often to prevent stiffness and to lessen swelling. Do not sit or stand for long periods of time.  Pay attention to any changes in your symptoms.  Contact a health care provider if you have edema that starts suddenly or is getting worse, especially if you are pregnant or have a medical condition.  Get help right away if you develop shortness of breath, especially when lying down. This information is not intended to replace advice given to you by your health care provider. Make sure you discuss any questions you have with your health care provider. Document Revised: 05/14/2018 Document Reviewed: 05/14/2018 Elsevier Patient Education  2020 Elsevier Inc.  DASH Eating Plan DASH stands for "Dietary Approaches to Stop Hypertension." The DASH eating plan is a healthy eating plan that has been shown to reduce high blood pressure (hypertension). It may also reduce your risk for type 2 diabetes, heart disease, and stroke. The DASH eating plan may also help with weight loss. What are  tips for following this plan?  General guidelines  Avoid eating more than 2,300 mg (milligrams) of salt (sodium) a day. If you have hypertension, you may need to reduce your sodium intake to 1,500 mg a day.  Limit alcohol intake to no more than 1 drink a day for nonpregnant women and 2 drinks a day for men. One drink equals 12 oz of beer, 5 oz of wine, or 1 oz of hard liquor.  Work with your health care provider to  maintain a healthy body weight or to lose weight. Ask what an ideal weight is for you.  Get at least 30 minutes of exercise that causes your heart to beat faster (aerobic exercise) most days of the week. Activities may include walking, swimming, or biking.  Work with your health care provider or diet and nutrition specialist (dietitian) to adjust your eating plan to your individual calorie needs. Reading food labels   Check food labels for the amount of sodium per serving. Choose foods with less than 5 percent of the Daily Value of sodium. Generally, foods with less than 300 mg of sodium per serving fit into this eating plan.  To find whole grains, look for the word "whole" as the first word in the ingredient list. Shopping  Buy products labeled as "low-sodium" or "no salt added."  Buy fresh foods. Avoid canned foods and premade or frozen meals. Cooking  Avoid adding salt when cooking. Use salt-free seasonings or herbs instead of table salt or sea salt. Check with your health care provider or pharmacist before using salt substitutes.  Do not fry foods. Cook foods using healthy methods such as baking, boiling, grilling, and broiling instead.  Cook with heart-healthy oils, such as olive, canola, soybean, or sunflower oil. Meal planning  Eat a balanced diet that includes: ? 5 or more servings of fruits and vegetables each day. At each meal, try to fill half of your plate with fruits and vegetables. ? Up to 6-8 servings of whole grains each day. ? Less than 6 oz of lean meat, poultry, or fish each day. A 3-oz serving of meat is about the same size as a deck of cards. One egg equals 1 oz. ? 2 servings of low-fat dairy each day. ? A serving of nuts, seeds, or beans 5 times each week. ? Heart-healthy fats. Healthy fats called Omega-3 fatty acids are found in foods such as flaxseeds and coldwater fish, like sardines, salmon, and mackerel.  Limit how much you eat of the following: ? Canned  or prepackaged foods. ? Food that is high in trans fat, such as fried foods. ? Food that is high in saturated fat, such as fatty meat. ? Sweets, desserts, sugary drinks, and other foods with added sugar. ? Full-fat dairy products.  Do not salt foods before eating.  Try to eat at least 2 vegetarian meals each week.  Eat more home-cooked food and less restaurant, buffet, and fast food.  When eating at a restaurant, ask that your food be prepared with less salt or no salt, if possible. What foods are recommended? The items listed may not be a complete list. Talk with your dietitian about what dietary choices are best for you. Grains Whole-grain or whole-wheat bread. Whole-grain or whole-wheat pasta. Brown rice. Orpah Cobb. Bulgur. Whole-grain and low-sodium cereals. Pita bread. Low-fat, low-sodium crackers. Whole-wheat flour tortillas. Vegetables Fresh or frozen vegetables (raw, steamed, roasted, or grilled). Low-sodium or reduced-sodium tomato and vegetable juice. Low-sodium or reduced-sodium tomato sauce  and tomato paste. Low-sodium or reduced-sodium canned vegetables. Fruits All fresh, dried, or frozen fruit. Canned fruit in natural juice (without added sugar). Meat and other protein foods Skinless chicken or [place]. Ground chicken or [place]. Pork with fat trimmed off. Fish and seafood. Egg whites. Dried beans, peas, or lentils. Unsalted nuts, nut butters, and seeds. Unsalted canned beans. Lean cuts of beef with fat trimmed off. Low-sodium, lean deli meat. Dairy Low-fat (1%) or fat-free (skim) milk. Fat-free, low-fat, or reduced-fat cheeses. Nonfat, low-sodium ricotta or cottage cheese. Low-fat or nonfat yogurt. Low-fat, low-sodium cheese. Fats and oils Soft margarine without trans fats. Vegetable oil. Low-fat, reduced-fat, or light mayonnaise and salad dressings (reduced-sodium). Canola, safflower, olive, soybean, and sunflower oils. Avocado. Seasoning and other foods Herbs. Spices.  Seasoning mixes without salt. Unsalted popcorn and pretzels. Fat-free sweets. What foods are not recommended? The items listed may not be a complete list. Talk with your dietitian about what dietary choices are best for you. Grains Baked goods made with fat, such as croissants, muffins, or some breads. Dry pasta or rice meal packs. Vegetables Creamed or fried vegetables. Vegetables in a cheese sauce. Regular canned vegetables (not low-sodium or reduced-sodium). Regular canned tomato sauce and paste (not low-sodium or reduced-sodium). Regular tomato and vegetable juice (not low-sodium or reduced-sodium). Rosita Fire. Olives. Fruits Canned fruit in a light or heavy syrup. Fried fruit. Fruit in cream or butter sauce. Meat and other protein foods Fatty cuts of meat. Ribs. Fried meat. Tomasa Blase. Sausage. Bologna and other processed lunch meats. Salami. Fatback. Hotdogs. Bratwurst. Salted nuts and seeds. Canned beans with added salt. Canned or smoked fish. Whole eggs or egg yolks. Chicken or [place] with skin. Dairy Whole or 2% milk, cream, and half-and-half. Whole or full-fat cream cheese. Whole-fat or sweetened yogurt. Full-fat cheese. Nondairy creamers. Whipped toppings. Processed cheese and cheese spreads. Fats and oils Butter. Stick margarine. Lard. Shortening. Ghee. Bacon fat. Tropical oils, such as coconut, palm kernel, or palm oil. Seasoning and other foods Salted popcorn and pretzels. Onion salt, garlic salt, seasoned salt, table salt, and sea salt. Worcestershire sauce. Tartar sauce. Barbecue sauce. Teriyaki sauce. Soy sauce, including reduced-sodium. Steak sauce. Canned and packaged gravies. Fish sauce. Oyster sauce. Cocktail sauce. Horseradish that you find on the shelf. Ketchup. Mustard. Meat flavorings and tenderizers. Bouillon cubes. Hot sauce and Tabasco sauce. Premade or packaged marinades. Premade or packaged taco seasonings. Relishes. Regular salad dressings. Where to find more  information:  National Heart, Lung, and Blood Institute: PopSteam.is  American Heart Association: www.heart.org Summary  The DASH eating plan is a healthy eating plan that has been shown to reduce high blood pressure (hypertension). It may also reduce your risk for type 2 diabetes, heart disease, and stroke.  With the DASH eating plan, you should limit salt (sodium) intake to 2,300 mg a day. If you have hypertension, you may need to reduce your sodium intake to 1,500 mg a day.  When on the DASH eating plan, aim to eat more fresh fruits and vegetables, whole grains, lean proteins, low-fat dairy, and heart-healthy fats.  Work with your health care provider or diet and nutrition specialist (dietitian) to adjust your eating plan to your individual calorie needs. This information is not intended to replace advice given to you by your health care provider. Make sure you discuss any questions you have with your health care provider. Document Revised: 08/02/2017 Document Reviewed: 08/13/2016 Elsevier Patient Education  2020 [employer].

## 2020-07-01 ENCOUNTER — Other Ambulatory Visit: Payer: Self-pay | Admitting: Family Medicine

## 2020-07-01 DIAGNOSIS — E1169 Type 2 diabetes mellitus with other specified complication: Secondary | ICD-10-CM

## 2020-07-01 DIAGNOSIS — E785 Hyperlipidemia, unspecified: Secondary | ICD-10-CM

## 2020-07-01 MED ORDER — ATORVASTATIN CALCIUM 20 MG PO TABS: 20.0000 mg | ORAL_TABLET | Freq: Every day | ORAL | 3 refills | Status: DC

## 2020-08-30 ENCOUNTER — Other Ambulatory Visit: Payer: Self-pay | Admitting: *Deleted

## 2020-08-30 ENCOUNTER — Telehealth: Payer: BC Managed Care – PPO | Admitting: Physician Assistant

## 2020-08-30 DIAGNOSIS — K029 Dental caries, unspecified: Secondary | ICD-10-CM | POA: Diagnosis not present

## 2020-08-30 MED ORDER — ALBUTEROL SULFATE HFA 108 (90 BASE) MCG/ACT IN AERS: INHALATION_SPRAY | RESPIRATORY_TRACT | 1 refills | Status: DC

## 2020-08-30 MED ORDER — AMOXICILLIN 500 MG PO CAPS
500.0000 mg | ORAL_CAPSULE | Freq: Three times a day (TID) | ORAL | 0 refills | Status: AC
Start: 2020-08-30 — End: 2020-09-09

## 2020-08-30 NOTE — Progress Notes (Signed)
E-Visit for Dental Pain  We are sorry that you are not feeling well.  Here is how we plan to help!  Based on what you have shared with me in the questionnaire, it sounds like you have a dental infection.  I am going to prescribe antibiotics, however if the swelling or pain increase, you will need to have a face to face exam immediately to ensure that the infection is not progressing into deeper tissues.  Do not continue to try to get pregnant while taking this antibiotic. I recommend a temporary contraceptive method such as condom usage while on this medication.  Amoxicillin 500mg  3 times per day for 10 days  It is imperative that you see a dentist within 10 days of this eVisit to determine the cause of the dental pain and be sure it is adequately treated  A toothache or tooth pain is caused when the nerve in the root of a tooth or surrounding a tooth is irritated. Dental (tooth) infection, decay, injury, or loss of a tooth are the most common causes of dental pain. Pain may also occur after an extraction (tooth is pulled out). Pain sometimes originates from other areas and radiates to the jaw, thus appearing to be tooth pain.Bacteria growing inside your mouth can contribute to gum disease and dental decay, both of which can cause pain. A toothache occurs from inflammation of the central portion of the tooth called pulp. The pulp contains nerve endings that are very sensitive to pain. Inflammation to the pulp or pulpitis may be caused by dental cavities, trauma, and infection.    HOME CARE:   For toothaches: . Over-the-counter pain medications such as acetaminophen or ibuprofen may be used. Take these as directed on the package while you arrange for a dental appointment. . Avoid very cold or hot foods, because they may make the pain worse. . You may get relief from biting on a cotton ball soaked in oil of cloves. You can get oil of cloves at most drug stores.  For jaw pain: .  Aspirin may be  helpful for problems in the joint of the jaw in adults. . If pain happens every time you open your mouth widely, the temporomandibular joint (TMJ) may be the source of the pain. Yawning or taking a large bite of food may worsen the pain. An appointment with your doctor or dentist will help you find the cause.     GET HELP RIGHT AWAY IF:  . You have a high fever or chills . If you have had a recent head or face injury and develop headache, light headedness, nausea, vomiting, or other symptoms that concern you after an injury to your face or mouth, you could have a more serious injury in addition to your dental injury. . A facial rash associated with a toothache: This condition may improve with medication. Contact your doctor for them to decide what is appropriate. . Any jaw pain occurring with chest pain: Although jaw pain is most commonly caused by dental disease, it is sometimes referred pain from other areas. People with heart disease, especially people who have had stents placed, people with diabetes, or those who have had heart surgery may have jaw pain as a symptom of heart attack or angina. If your jaw or tooth pain is associated with lightheadedness, sweating, or shortness of breath, you should see a doctor as soon as possible. . Trouble swallowing or excessive pain or bleeding from gums: If you have a history  of a weakened immune system, diabetes, or steroid use, you may be more susceptible to infections. Infections can often be more severe and extensive or caused by unusual organisms. Dental and gum infections in people with these conditions may require more aggressive treatment. An abscess may need draining or IV antibiotics, for example.  MAKE SURE YOU    Understand these instructions.  Will watch your condition.  Will get help right away if you are not doing well or get worse.  Thank you for choosing an e-visit. Your e-visit answers were reviewed by a board certified advanced  clinical practitioner to complete your personal care plan. Depending upon the condition, your plan could have included both over the counter or prescription medications. Please review your pharmacy choice. Make sure the pharmacy is open so you can pick up prescription now. If there is a problem, you may contact your provider through [employer] and have the prescription routed to another pharmacy. Your safety is important to [place]. If you have drug allergies check your prescription carefully.  For the next 24 hours you can use MyChart to ask questions about today's visit, request a non-urgent call back, or ask for a work or school excuse. You will get an email in the next two days asking about your experience. I hope that your e-visit has been valuable and will speed your recovery.  Greater than 5 minutes, yet less than 10 minutes of time have been spent researching, coordinating, and implementing care for this patient today

## 2020-09-01 ENCOUNTER — Telehealth: Payer: Self-pay

## 2020-09-01 NOTE — Telephone Encounter (Signed)
Appointment made for Monday with JE

## 2020-09-02 DIAGNOSIS — Z7984 Long term (current) use of oral hypoglycemic drugs: Secondary | ICD-10-CM | POA: Diagnosis not present

## 2020-09-02 DIAGNOSIS — E1165 Type 2 diabetes mellitus with hyperglycemia: Secondary | ICD-10-CM | POA: Diagnosis not present

## 2020-09-02 DIAGNOSIS — F17210 Nicotine dependence, cigarettes, uncomplicated: Secondary | ICD-10-CM | POA: Diagnosis not present

## 2020-09-03 DIAGNOSIS — Z7984 Long term (current) use of oral hypoglycemic drugs: Secondary | ICD-10-CM | POA: Diagnosis not present

## 2020-09-03 DIAGNOSIS — E1165 Type 2 diabetes mellitus with hyperglycemia: Secondary | ICD-10-CM | POA: Diagnosis not present

## 2020-09-03 DIAGNOSIS — F17210 Nicotine dependence, cigarettes, uncomplicated: Secondary | ICD-10-CM | POA: Diagnosis not present

## 2020-09-05 ENCOUNTER — Other Ambulatory Visit: Payer: Self-pay

## 2020-09-05 ENCOUNTER — Ambulatory Visit (INDEPENDENT_AMBULATORY_CARE_PROVIDER_SITE_OTHER): Payer: BC Managed Care – PPO | Admitting: Nurse Practitioner

## 2020-09-05 ENCOUNTER — Encounter: Payer: Self-pay | Admitting: Nurse Practitioner

## 2020-09-05 VITALS — BP 87/47 | HR 70 | Temp 98.0°F | Ht 67.0 in | Wt 186.0 lb

## 2020-09-05 DIAGNOSIS — K219 Gastro-esophageal reflux disease without esophagitis: Secondary | ICD-10-CM | POA: Diagnosis not present

## 2020-09-05 DIAGNOSIS — E118 Type 2 diabetes mellitus with unspecified complications: Secondary | ICD-10-CM

## 2020-09-05 MED ORDER — TRULICITY 0.75 MG/0.5ML ~~LOC~~ SOAJ
0.7500 mg | SUBCUTANEOUS | 1 refills | Status: DC
Start: 2020-09-05 — End: 2021-02-07

## 2020-09-05 NOTE — Patient Instructions (Signed)

## 2020-09-05 NOTE — Assessment & Plan Note (Signed)
Diabetes type 2 not well managed on Metformin.  Trulicity reordered to pharmacy.  Samples given to patient.  Provided education with printed handouts given.  Advised patient to take medication as prescribed to help maintain blood sugar. Patient will return back to clinic January 30th for A1c, CBC, and CMP.  Rx sent to pharmacy.

## 2020-09-05 NOTE — Assessment & Plan Note (Signed)
Gastroesophageal reflux disease well managed on Prilosec 20 mg tablet daily.  No changes to current medication dose.  Continue healthy diet and exercise regimen as tolerated.

## 2020-09-05 NOTE — Progress Notes (Signed)
Established Patient Office Visit  Subjective:  Patient ID: Brittany Wade, female    DOB: [birth year]  Age: 20 y.o. MRN: [id number]  CC:  Chief Complaint  Patient presents with  . Hyperglycemia    HPI Brittany Wade is a 20 year old female who presents for The patient presents with history of type II diabetes mellitus without complications. Patient was diagnosed in 2019 compliance with treatment has been good; the patient takes medication as directed, recently patient ran out of Trulicity and was unable to afford medication out-of-pocket discussed patient's blood sugar elevations.  Patient reports she has had to take more Metformin then prescribed and lately blood sugars have been running in the 300s.,  Patient tries to maintains a diabetic diet and an exercise regimen and  follows up as directed , and is keeping a glucose diary. Patient specifically denies associated symptoms, including blurred vision, fatigue, polydipsia, polyphagia and polyuria . Patient denies hypoglycemia. In regard to preventative care, the patient performs foot self-exams daily and last ophthalmology exam was in: will schedule one.  GERD, Follow up:  The patient was last seen for GERD 2 years ago. Changes made since that visit include none.  She reports excellent compliance with treatment. She is not having side effects. .  She IS experiencing no complications. She is NOT experiencing abdominal bloating, bilious reflux, chest pain, choking on food or cough  -----------------------------------------------------------------------------------------   Past Medical History:  Diagnosis Date  . Constipation   . Diabetes mellitus without complication [facility])     Past Surgical History:  Procedure Laterality Date  . ADENOIDECTOMY    . MOUTH SURGERY    . TONSILLECTOMY      Family History  Problem Relation Age of Onset  . Migraines Mother   . Mental illness Mother   . Bipolar disorder Mother   . Depression  Mother   . Anxiety disorder Mother   . Seizures Mother   . Asperger's syndrome Sister   . ADD / ADHD Sister   . Migraines Maternal Grandmother   . Depression Maternal Grandmother   . Anxiety disorder Maternal Grandmother   . Epilepsy Other        maternal aunt  . ADD / ADHD Cousin     Social History   Socioeconomic History  . Marital status: Single    Spouse name: Not on file  . Number of children: Not on file  . Years of education: Not on file  . Highest education level: Not on file  Occupational History  . Not on file  Tobacco Use  . Smoking status: Current Every Day Smoker    Packs/day: 0.50    Years: 4.00    Pack years: 2.00    Types: Cigarettes  . Smokeless tobacco: Never Used  Vaping Use  . Vaping Use: Never used  Substance and Sexual Activity  . Alcohol use: No  . Drug use: No  . Sexual activity: Yes    Birth control/protection: Pill  Other Topics Concern  . Not on file  Social History Narrative  . Not on file   Social Determinants of Health   Financial Resource Strain: Not on file  Food Insecurity: Not on file  Transportation Needs: Not on file  Physical Activity: Not on file  Stress: Not on file  Social Connections: Not on file  Intimate Partner Violence: Not on file      Allergies  Allergen Reactions  . Red Dye Hives and Rash  . Codeine Hives  ROS Review of Systems  Eyes: Negative.   Respiratory: Negative.   Cardiovascular: Negative.   Endocrine: Negative for polyphagia and polyuria.  Genitourinary: Negative.   Musculoskeletal: Negative.   Skin: Negative.   Neurological: Negative.   Psychiatric/Behavioral: Negative.   All other systems reviewed and are negative.     Objective:    Physical Exam Vitals reviewed.  Constitutional:      General: She is awake.     Appearance: Normal appearance. She is well-groomed and overweight.     Interventions: Face mask in place.  HENT:     Head: Normocephalic.  Eyes:      Conjunctiva/sclera: Conjunctivae normal.  Cardiovascular:     Rate and Rhythm: Normal rate and regular rhythm.     Pulses: Normal pulses.     Heart sounds: Normal heart sounds.  Pulmonary:     Effort: Pulmonary effort is normal.     Breath sounds: Normal breath sounds.  Abdominal:     General: Bowel sounds are normal.  Musculoskeletal:        General: Normal range of motion.  Skin:    General: Skin is warm.  Neurological:     Mental Status: She is alert and oriented to person, place, and time.  Psychiatric:        Behavior: Behavior is cooperative.     BP (!) 87/47   Pulse 70   Temp 98 F (36.7 C)   Ht 5\' 7"  (1.702 m)   Wt 186 lb (84.4 kg)   SpO2 100%   BMI 29.13 kg/m  Wt Readings from Last 3 Encounters:  09/05/20 186 lb (84.4 kg)  06/30/20 184 lb 8 oz (83.7 kg)  03/25/20 189 lb 6.4 oz (85.9 kg)     Health Maintenance Due  Topic Date Due  . COVID-19 Vaccine (1) Never done  . PAP-Cervical Cytology Screening  08/05/2020  . PAP SMEAR-Modifier  08/05/2020    There are no preventive care reminders to display for this patient.  No results found for: TSH Lab Results  Component Value Date   WBC 12.9 (H) 03/25/2020   HGB 11.5 03/25/2020   HCT 37.2 03/25/2020   MCV 83 03/25/2020   PLT 404 03/25/2020   Lab Results  Component Value Date   NA 139 06/30/2020   K 4.4 06/30/2020   CO2 21 06/30/2020   GLUCOSE 242 (H) 06/30/2020   BUN 11 06/30/2020   CREATININE 0.65 06/30/2020   BILITOT 0.2 06/30/2020   ALKPHOS 75 06/30/2020   AST 11 06/30/2020   ALT 11 06/30/2020   PROT 6.6 06/30/2020   ALBUMIN 4.3 06/30/2020   CALCIUM 9.2 06/30/2020   ANIONGAP 5 10/11/2014   Lab Results  Component Value Date   CHOL 172 06/30/2020   Lab Results  Component Value Date   HDL 31 (L) 06/30/2020   Lab Results  Component Value Date   LDLCALC 114 (H) 06/30/2020   Lab Results  Component Value Date   TRIG 148 06/30/2020   Lab Results  Component Value Date   CHOLHDL 5.5  (H) 06/30/2020   Lab Results  Component Value Date   HGBA1C 7.3 (H) 06/30/2020      Assessment & Plan:   Problem List Items Addressed This Visit      Digestive   Gastroesophageal reflux disease without esophagitis    Gastroesophageal reflux disease well managed on Prilosec 20 mg tablet daily.  No changes to current medication dose.  Continue healthy diet and exercise regimen  as tolerated.          Endocrine   Type 2 diabetes mellitus with complication, without long-term current use of insulin (HCC) - Primary    Diabetes type 2 not well managed on Metformin.  Trulicity reordered to pharmacy.  Samples given to patient.  Provided education with printed handouts given.  Advised patient to take medication as prescribed to help maintain blood sugar. Patient will return back to clinic January 30th for A1c, CBC, and CMP.  Rx sent to pharmacy.      Relevant Medications   Dulaglutide (TRULICITY) 0.75 MG/0.5ML SOPN      Meds ordered this encounter  Medications  . Dulaglutide (TRULICITY) 0.75 MG/0.5ML SOPN    Sig: Inject 0.75 mg into the skin once a week.    Dispense:  2 mL    Refill:  1    Order Specific Question:   Supervising Provider    Answer:   Raliegh Ip [[id number]]    Follow-up: Return in about 4 weeks (around 10/03/2020).    Daryll Drown, NP

## 2020-09-08 ENCOUNTER — Telehealth: Payer: Self-pay

## 2020-09-08 NOTE — Telephone Encounter (Signed)
Left messge to callback

## 2020-09-12 ENCOUNTER — Encounter: Payer: Medicaid Other | Admitting: Pharmacist

## 2020-09-12 NOTE — Progress Notes (Signed)
This encounter was created in error - please disregard.

## 2020-09-13 ENCOUNTER — Other Ambulatory Visit: Payer: Self-pay

## 2020-09-13 ENCOUNTER — Ambulatory Visit (INDEPENDENT_AMBULATORY_CARE_PROVIDER_SITE_OTHER): Payer: Medicaid Other | Admitting: Pharmacist

## 2020-09-13 DIAGNOSIS — E119 Type 2 diabetes mellitus without complications: Secondary | ICD-10-CM

## 2020-09-13 MED ORDER — PEN NEEDLES 32G X 4 MM MISC: 11 refills | Status: DC

## 2020-09-13 MED ORDER — FREESTYLE LIBRE 2 SENSOR MISC: 11 refills | Status: DC

## 2020-09-13 MED ORDER — LANTUS SOLOSTAR 100 UNIT/ML ~~LOC~~ SOPN: 10.0000 [IU] | PEN_INJECTOR | Freq: Every day | SUBCUTANEOUS | 99 refills | Status: DC

## 2020-09-13 NOTE — Progress Notes (Signed)
    09/13/2020 Name: Brittany Wade MRN: [id number] DOB: [birth year]   S:   19 yoF Presents for diabetes evaluation, education, and management Patient was referred and last seen by Primary Care Provider last week.  She was recently admitted to hospital with hyperglycemia and has been missing work as well. Patient has previously followed with Dr. Shawnee Knapp for endocrine, but was dismissed due to transportation/communication issues.  Insurance coverage/medication affordability: medicaid  Patient reports adherence with medications. . Current diabetes medications include: trulicity (new start) metformin . Current hypertension medications include: n/a Goal 130/80 . Current hyperlipidemia medications include: atorvastatin   Patient denies hypoglycemic events.   O:  Lab Results  Component Value Date   HGBA1C 7.3 (H) 06/30/2020    Lipid Panel     Component Value Date/Time   CHOL 172 06/30/2020 1107   TRIG 148 06/30/2020 1107   HDL 31 (L) 06/30/2020 1107   CHOLHDL 5.5 (H) 06/30/2020 1107   LDLCALC 114 (H) 06/30/2020 1107    A/P:  Diabetes T2DM currently uncontrolled, patient reports recent hyperglycemia Patient is adherent with medication. She reports better diet control and weight loss.  Started lantus 5 units DAILY Continue Trulicity 0.75mg  sq weekly Continue metformin Patient reports BGs>300 over night FBG this AM 150 Recently admitted to hospital for hyperglycemia Libre 2 CGM system called in for patient to continuously check sugar  -Extensively discussed pathophysiology of diabetes, recommended lifestyle interventions, dietary effects on blood sugar control  -Counseled on s/sx of and management of hypoglycemia  -Next A1C anticipated 09/30/20.  Written patient instructions provided.  Total time in face to face counseling 20 minutes.   Follow up PCP Clinic Visit ON 09/30/20.   Kieth Brightly, PharmD, BCPS Clinical Pharmacist, [facility]  II Phone [phone]

## 2020-09-19 NOTE — Telephone Encounter (Signed)
Patient has been contacted since telephone encounter.  This encounter will now be closed

## 2020-09-30 ENCOUNTER — Ambulatory Visit: Payer: BC Managed Care – PPO | Admitting: Family Medicine

## 2020-09-30 ENCOUNTER — Encounter: Payer: Self-pay | Admitting: Family Medicine

## 2020-10-04 ENCOUNTER — Other Ambulatory Visit: Payer: Self-pay

## 2020-10-04 ENCOUNTER — Ambulatory Visit (INDEPENDENT_AMBULATORY_CARE_PROVIDER_SITE_OTHER): Payer: BC Managed Care – PPO | Admitting: Family Medicine

## 2020-10-04 ENCOUNTER — Encounter: Payer: Self-pay | Admitting: Family Medicine

## 2020-10-04 VITALS — BP 113/71 | HR 90 | Temp 98.4°F | Ht 67.0 in | Wt 175.0 lb

## 2020-10-04 DIAGNOSIS — E785 Hyperlipidemia, unspecified: Secondary | ICD-10-CM

## 2020-10-04 DIAGNOSIS — R112 Nausea with vomiting, unspecified: Secondary | ICD-10-CM

## 2020-10-04 DIAGNOSIS — K219 Gastro-esophageal reflux disease without esophagitis: Secondary | ICD-10-CM

## 2020-10-04 DIAGNOSIS — E1069 Type 1 diabetes mellitus with other specified complication: Secondary | ICD-10-CM | POA: Insufficient documentation

## 2020-10-04 DIAGNOSIS — Z23 Encounter for immunization: Secondary | ICD-10-CM | POA: Diagnosis not present

## 2020-10-04 DIAGNOSIS — E118 Type 2 diabetes mellitus with unspecified complications: Secondary | ICD-10-CM | POA: Diagnosis not present

## 2020-10-04 DIAGNOSIS — E1169 Type 2 diabetes mellitus with other specified complication: Secondary | ICD-10-CM

## 2020-10-04 LAB — BAYER DCA HB A1C WAIVED: HB A1C (BAYER DCA - WAIVED): 6.9 % (ref <7.0)

## 2020-10-04 MED ORDER — ONDANSETRON 4 MG PO TBDP
4.0000 mg | ORAL_TABLET | Freq: Three times a day (TID) | ORAL | 0 refills | Status: DC | PRN
Start: 2020-10-04 — End: 2020-12-26

## 2020-10-04 MED ORDER — METFORMIN HCL 1000 MG PO TABS: 1000.0000 mg | ORAL_TABLET | Freq: Two times a day (BID) | ORAL | 1 refills | Status: DC

## 2020-10-04 MED ORDER — OMEPRAZOLE 20 MG PO CPDR: DELAYED_RELEASE_CAPSULE | ORAL | 1 refills | Status: DC

## 2020-10-04 NOTE — Progress Notes (Signed)
Assessment & Plan:  1. Type 2 diabetes mellitus with complication, without long-term current use of insulin (HCC) Lab Results  Component Value Date   HGBA1C 6.9 10/04/2020   HGBA1C 7.3 (H) 06/30/2020   HGBA1C 8.9 (H) 03/25/2020    - Diabetes is at goal of A1c < 7. - Medications: continue current medications - Home glucose monitoring: Continue on during - Patient is currently taking a statin. Patient is not taking an ACE-inhibitor/ARB.  - Instruction/counseling given: discussed diet and provided printed educational material  Diabetes Health Maintenance Due  Topic Date Due  . OPHTHALMOLOGY EXAM  Never done  . URINE MICROALBUMIN  03/25/2021  . HEMOGLOBIN A1C  04/03/2021  . FOOT EXAM  06/30/2021    Lab Results  Component Value Date   LABMICR 16.6 03/25/2020   - Bayer DCA Hb A1c Waived - metFORMIN (GLUCOPHAGE) 1000 MG tablet; Take 1 tablet (1,000 mg total) by mouth 2 (two) times daily with a meal.  Dispense: 180 tablet; Refill: 1  2. Hyperlipidemia associated with type 2 diabetes mellitus ([place]) - Labs to assess. - Lipid panel - CMP14+EGFR - CBC with Differential/Platelet  3. Gastroesophageal reflux disease without esophagitis - Well controlled on current regimen.  - omeprazole (PRILOSEC) 20 MG capsule; TAKE ONE (1) CAPSULE EACH DAY  Dispense: 90 capsule; Refill: 1  4. Nausea and vomiting in adult - Improving. Zofran given for as needed use. Work note provided for this past weekend. - ondansetron (ZOFRAN ODT) 4 MG disintegrating tablet; Take 1 tablet (4 mg total) by mouth every 8 (eight) hours as needed for nausea or vomiting.  Dispense: 30 tablet; Refill: 0   Return in about 3 months (around 01/01/2021) for DM.  Hendricks Limes, MSN, APRN, FNP-C [facility]  Subjective:    Patient ID: Brittany Wade, female    DOB: 09-12-2000, 20 y.o.   MRN: [id number]  Patient Care Team: Loman Brooklyn, FNP as PCP - General (Family Medicine)   Chief  Complaint:  Chief Complaint  Patient presents with  . Diabetes    Check up of chronic medical conditions   . Weight Loss  . Nausea    Patient states that Friday, sat and Sunday she was having n&v and confusion     HPI: Brittany Wade is a 20 y.o. female presenting on 10/04/2020 for Diabetes (Check up of chronic medical conditions/), Weight Loss, and Nausea (Patient states that Friday, sat and Sunday she was having n&v and confusion )  Diabetes: Patient presents for follow up of diabetes. Current symptoms include: none. Known diabetic complications: none. Medication compliance: yes. Home blood sugar records: BGs are running  consistent with Hgb A1C. Is she  on ACE inhibitor or angiotensin II receptor blocker? No. Is she on a statin? Yes.   New complaints: Patient reports she started feeling bad Friday evening on her way to work and did not make it to work that night.  Saturday night around 1 AM she started throwing up.  She had to leave early that night and then missed work Sunday night as well due to nausea and vomiting.  She reports the vomiting has stopped but she still is having a lot of nausea.  She was checking her blood sugars during this time and states she was not having any highs or lows.  Gives a reading of 115.  Social history:  Relevant past medical, surgical, family and social history reviewed and updated as indicated. Interim medical history since our  last visit reviewed.  Allergies and medications reviewed and updated.  DATA REVIEWED: CHART IN EPIC  ROS: Negative unless specifically indicated above in HPI.    Current Outpatient Medications:  .  albuterol (VENTOLIN HFA) 108 (90 Base) MCG/ACT inhaler, USE 2 PUFFS EVERY 6 HOURS AS NEEDED, Disp: 8.5 g, Rfl: 1 .  atorvastatin (LIPITOR) 20 MG tablet, Take 1 tablet (20 mg total) by mouth daily., Disp: 90 tablet, Rfl: 3 .  Continuous Blood Gluc Sensor (FREESTYLE LIBRE 2 SENSOR) MISC, Use to test blood sugar 6 times daily as  needed. DX: E11.65, Disp: 2 each, Rfl: 11 .  Dulaglutide (TRULICITY) 9.74 BU/3.8GT SOPN, Inject 0.75 mg into the skin once a week., Disp: 2 mL, Rfl: 1 .  furosemide (LASIX) 20 MG tablet, Take 1 tablet (20 mg total) by mouth as needed for edema (for edema)., Disp: 30 tablet, Rfl: 3 .  insulin glargine (LANTUS SOLOSTAR) 100 UNIT/ML Solostar Pen, Inject 10 Units into the skin daily., Disp: 15 mL, Rfl: PRN .  insulin glargine (LANTUS SOLOSTAR) 100 UNIT/ML Solostar Pen, Inject 10 Units into the skin daily., Disp: , Rfl:  .  Insulin Pen Needle (PEN NEEDLES) 32G X 4 MM MISC, Use to inject insulin daily, Disp: 100 each, Rfl: 11 .  metFORMIN (GLUCOPHAGE) 1000 MG tablet, Take 1 tablet (1,000 mg total) by mouth 2 (two) times daily with a meal., Disp: 180 tablet, Rfl: 1 .  naproxen (NAPROSYN) 500 MG tablet, Take 500 mg by mouth 2 (two) times daily., Disp: , Rfl:  .  omeprazole (PRILOSEC) 20 MG capsule, TAKE ONE (1) CAPSULE EACH DAY, Disp: 90 capsule, Rfl: 1   Allergies  Allergen Reactions  . Red Dye Hives and Rash  . Codeine Hives   Past Medical History:  Diagnosis Date  . Constipation   . Diabetes mellitus without complication [facility])     Past Surgical History:  Procedure Laterality Date  . ADENOIDECTOMY    . MOUTH SURGERY    . TONSILLECTOMY      Social History   Socioeconomic History  . Marital status: Single    Spouse name: Not on file  . Number of children: Not on file  . Years of education: Not on file  . Highest education level: Not on file  Occupational History  . Not on file  Tobacco Use  . Smoking status: Current Every Day Smoker    Packs/day: 0.50    Years: 4.00    Pack years: 2.00    Types: Cigarettes  . Smokeless tobacco: Never Used  Vaping Use  . Vaping Use: Never used  Substance and Sexual Activity  . Alcohol use: No  . Drug use: No  . Sexual activity: Yes    Birth control/protection: Pill  Other Topics Concern  . Not on file  Social History Narrative  . Not on  file   Social Determinants of Health   Financial Resource Strain: Not on file  Food Insecurity: Not on file  Transportation Needs: Not on file  Physical Activity: Not on file  Stress: Not on file  Social Connections: Not on file  Intimate Partner Violence: Not on file        Objective:    There were no vitals taken for this visit.  Wt Readings from Last 3 Encounters:  09/05/20 186 lb (84.4 kg)  06/30/20 184 lb 8 oz (83.7 kg)  03/25/20 189 lb 6.4 oz (85.9 kg)    Physical Exam Vitals reviewed.  Constitutional:  General: She is not in acute distress.    Appearance: Normal appearance. She is overweight. She is not ill-appearing, toxic-appearing or diaphoretic.  HENT:     Head: Normocephalic and atraumatic.  Eyes:     General: No scleral icterus.       Right eye: No discharge.        Left eye: No discharge.     Conjunctiva/sclera: Conjunctivae normal.  Cardiovascular:     Rate and Rhythm: Normal rate and regular rhythm.     Heart sounds: Normal heart sounds. No murmur heard. No friction rub. No gallop.   Pulmonary:     Effort: Pulmonary effort is normal. No respiratory distress.     Breath sounds: Normal breath sounds. No stridor. No wheezing, rhonchi or rales.  Abdominal:     General: Abdomen is flat. Bowel sounds are normal.     Palpations: Abdomen is soft.  Musculoskeletal:        General: Normal range of motion.     Cervical back: Normal range of motion.  Skin:    General: Skin is warm and dry.     Capillary Refill: Capillary refill takes less than 2 seconds.  Neurological:     General: No focal deficit present.     Mental Status: She is alert and oriented to person, place, and time. Mental status is at baseline.  Psychiatric:        Mood and Affect: Mood normal.        Behavior: Behavior normal.        Thought Content: Thought content normal.        Judgment: Judgment normal.     No results found for: TSH Lab Results  Component Value Date   WBC  12.9 (H) 03/25/2020   HGB 11.5 03/25/2020   HCT 37.2 03/25/2020   MCV 83 03/25/2020   PLT 404 03/25/2020   Lab Results  Component Value Date   NA 139 06/30/2020   K 4.4 06/30/2020   CO2 21 06/30/2020   GLUCOSE 242 (H) 06/30/2020   BUN 11 06/30/2020   CREATININE 0.65 06/30/2020   BILITOT 0.2 06/30/2020   ALKPHOS 75 06/30/2020   AST 11 06/30/2020   ALT 11 06/30/2020   PROT 6.6 06/30/2020   ALBUMIN 4.3 06/30/2020   CALCIUM 9.2 06/30/2020   ANIONGAP 5 10/11/2014   Lab Results  Component Value Date   CHOL 172 06/30/2020   Lab Results  Component Value Date   HDL 31 (L) 06/30/2020   Lab Results  Component Value Date   LDLCALC 114 (H) 06/30/2020   Lab Results  Component Value Date   TRIG 148 06/30/2020   Lab Results  Component Value Date   CHOLHDL 5.5 (H) 06/30/2020   Lab Results  Component Value Date   HGBA1C 7.3 (H) 06/30/2020

## 2020-10-05 ENCOUNTER — Other Ambulatory Visit: Payer: Self-pay | Admitting: Family Medicine

## 2020-10-05 DIAGNOSIS — E1169 Type 2 diabetes mellitus with other specified complication: Secondary | ICD-10-CM

## 2020-10-05 LAB — CBC WITH DIFFERENTIAL/PLATELET
Basophils Absolute: 0.1 10*3/uL (ref 0.0–0.2)
Basos: 1 %
EOS (ABSOLUTE): 0.3 10*3/uL (ref 0.0–0.4)
Eos: 3 %
Hematocrit: 35.7 % (ref 34.0–46.6)
Hemoglobin: 10.7 g/dL — ABNORMAL LOW (ref 11.1–15.9)
Immature Grans (Abs): 0 10*3/uL (ref 0.0–0.1)
Immature Granulocytes: 0 %
Lymphocytes Absolute: 4.3 10*3/uL — ABNORMAL HIGH (ref 0.7–3.1)
Lymphs: 41 %
MCH: 23.2 pg — ABNORMAL LOW (ref 26.6–33.0)
MCHC: 30 g/dL — ABNORMAL LOW (ref 31.5–35.7)
MCV: 77 fL — ABNORMAL LOW (ref 79–97)
Monocytes Absolute: 0.8 10*3/uL (ref 0.1–0.9)
Monocytes: 8 %
Neutrophils Absolute: 5 10*3/uL (ref 1.4–7.0)
Neutrophils: 47 %
Platelets: 325 10*3/uL (ref 150–450)
RBC: 4.61 x10E6/uL (ref 3.77–5.28)
RDW: 16.8 % — ABNORMAL HIGH (ref 11.7–15.4)
WBC: 10.4 10*3/uL (ref 3.4–10.8)

## 2020-10-05 LAB — CMP14+EGFR
ALT: 16 IU/L (ref 0–32)
AST: 14 IU/L (ref 0–40)
Albumin/Globulin Ratio: 2.3 — ABNORMAL HIGH (ref 1.2–2.2)
Albumin: 4.8 g/dL (ref 3.9–5.0)
Alkaline Phosphatase: 66 IU/L (ref 44–121)
BUN/Creatinine Ratio: 12 (ref 9–23)
BUN: 9 mg/dL (ref 6–20)
Bilirubin Total: 0.4 mg/dL (ref 0.0–1.2)
CO2: 21 mmol/L (ref 20–29)
Calcium: 9.4 mg/dL (ref 8.7–10.2)
Chloride: 102 mmol/L (ref 96–106)
Creatinine, Ser: 0.78 mg/dL (ref 0.57–1.00)
GFR calc Af Amer: 126 mL/min/{1.73_m2} (ref >59)
GFR calc non Af Amer: 109 mL/min/{1.73_m2} (ref >59)
Globulin, Total: 2.1 g/dL (ref 1.5–4.5)
Glucose: 177 mg/dL — ABNORMAL HIGH (ref 65–99)
Potassium: 3.9 mmol/L (ref 3.5–5.2)
Sodium: 138 mmol/L (ref 134–144)
Total Protein: 6.9 g/dL (ref 6.0–8.5)

## 2020-10-05 LAB — LIPID PANEL
Chol/HDL Ratio: 5 ratio — ABNORMAL HIGH (ref 0.0–4.4)
Cholesterol, Total: 166 mg/dL (ref 100–199)
HDL: 33 mg/dL — ABNORMAL LOW (ref >39)
LDL Chol Calc (NIH): 116 mg/dL — ABNORMAL HIGH (ref 0–99)
Triglycerides: 88 mg/dL (ref 0–149)
VLDL Cholesterol Cal: 17 mg/dL (ref 5–40)

## 2020-10-05 MED ORDER — ATORVASTATIN CALCIUM 40 MG PO TABS: 40.0000 mg | ORAL_TABLET | Freq: Every day | ORAL | 1 refills | Status: DC

## 2020-11-09 ENCOUNTER — Encounter: Payer: Self-pay | Admitting: Family Medicine

## 2020-11-09 ENCOUNTER — Ambulatory Visit: Payer: BC Managed Care – PPO | Admitting: Family Medicine

## 2020-11-09 DIAGNOSIS — K047 Periapical abscess without sinus: Secondary | ICD-10-CM | POA: Diagnosis not present

## 2020-11-09 MED ORDER — AMOXICILLIN-POT CLAVULANATE 875-125 MG PO TABS: 1.0000 | ORAL_TABLET | Freq: Two times a day (BID) | ORAL | 0 refills | Status: AC

## 2020-11-09 NOTE — Progress Notes (Signed)
Virtual Visit via Telephone Note  I connected with Brittany Wade on 11/09/20 at 11:27 AM by telephone and verified that I am speaking with the correct person using two identifiers. Brittany Wade is currently located in her vehicle and nobody is currently with her during this visit. The provider, Gwenlyn Fudge, FNP is located in their office at time of visit.  I discussed the limitations, risks, security and privacy concerns of performing an evaluation and management service by telephone and the availability of in person appointments. I also discussed with the patient that there may be a patient responsible charge related to this service. The patient expressed understanding and agreed to proceed.  Subjective: PCP: Gwenlyn Fudge, FNP  Chief Complaint  Patient presents with  . Dental Pain   Patient reports she has an infection around the wisdom teeth on the right side.  She feels like it is mostly on the bottom.  Her mouth is swollen as well as the right side of her face.  She is having increased right ear pain and reports it is more difficult to swallow.  She denies seeing any drainage in her mouth but states that she can taste the infection.  She had to miss work yesterday as she is required to wear earplugs at work and just could not tolerate it.  She is aware that she needs to establish with an oral surgeon.   ROS: Per HPI  Current Outpatient Medications:  .  albuterol (VENTOLIN HFA) 108 (90 Base) MCG/ACT inhaler, USE 2 PUFFS EVERY 6 HOURS AS NEEDED, Disp: 8.5 g, Rfl: 1 .  atorvastatin (LIPITOR) 40 MG tablet, Take 1 tablet (40 mg total) by mouth daily., Disp: 90 tablet, Rfl: 1 .  Continuous Blood Gluc Sensor (FREESTYLE LIBRE 2 SENSOR) MISC, Use to test blood sugar 6 times daily as needed. DX: E11.65, Disp: 2 each, Rfl: 11 .  Dulaglutide (TRULICITY) 0.75 MG/0.5ML SOPN, Inject 0.75 mg into the skin once a week., Disp: 2 mL, Rfl: 1 .  furosemide (LASIX) 20 MG tablet, Take 1 tablet  (20 mg total) by mouth as needed for edema (for edema)., Disp: 30 tablet, Rfl: 3 .  insulin glargine (LANTUS SOLOSTAR) 100 UNIT/ML Solostar Pen, Inject 10 Units into the skin daily., Disp: 15 mL, Rfl: PRN .  insulin glargine (LANTUS SOLOSTAR) 100 UNIT/ML Solostar Pen, Inject 10 Units into the skin daily., Disp: , Rfl:  .  Insulin Pen Needle (PEN NEEDLES) 32G X 4 MM MISC, Use to inject insulin daily, Disp: 100 each, Rfl: 11 .  metFORMIN (GLUCOPHAGE) 1000 MG tablet, Take 1 tablet (1,000 mg total) by mouth 2 (two) times daily with a meal., Disp: 180 tablet, Rfl: 1 .  naproxen (NAPROSYN) 500 MG tablet, Take 500 mg by mouth 2 (two) times daily., Disp: , Rfl:  .  omeprazole (PRILOSEC) 20 MG capsule, TAKE ONE (1) CAPSULE EACH DAY, Disp: 90 capsule, Rfl: 1 .  ondansetron (ZOFRAN ODT) 4 MG disintegrating tablet, Take 1 tablet (4 mg total) by mouth every 8 (eight) hours as needed for nausea or vomiting., Disp: 30 tablet, Rfl: 0  Allergies  Allergen Reactions  . Red Dye Hives and Rash  . Codeine Hives   Past Medical History:  Diagnosis Date  . Constipation   . Diabetes mellitus without complication (HCC)     Observations/Objective: A&O  No respiratory distress or wheezing audible over the phone Mood, judgement, and thought processes all WNL   Assessment and Plan: 1.  Dental infection Encouraged to establish with an oral surgeon as soon as possible.  Discussed that this is going to keep happening until she takes care of the problem. - amoxicillin-clavulanate (AUGMENTIN) 875-125 MG tablet; Take 1 tablet by mouth 2 (two) times daily for 7 days.  Dispense: 14 tablet; Refill: 0   Follow Up Instructions:  I discussed the assessment and treatment plan with the patient. The patient was provided an opportunity to ask questions and all were answered. The patient agreed with the plan and demonstrated an understanding of the instructions.   The patient was advised to call back or seek an in-person  evaluation if the symptoms worsen or if the condition fails to improve as anticipated.  The above assessment and management plan was discussed with the patient. The patient verbalized understanding of and has agreed to the management plan. Patient is aware to call the clinic if symptoms persist or worsen. Patient is aware when to return to the clinic for a follow-up visit. Patient educated on when it is appropriate to go to the emergency department.   Time call ended: 11:33 AM  I provided 6 minutes of non-face-to-face time during this encounter.  Deliah Boston, MSN, APRN, FNP-C [facility] 11/09/20

## 2020-11-26 ENCOUNTER — Other Ambulatory Visit: Payer: Self-pay

## 2020-11-26 ENCOUNTER — Emergency Department (HOSPITAL_BASED_OUTPATIENT_CLINIC_OR_DEPARTMENT_OTHER)
Admission: EM | Admit: 2020-11-26 | Discharge: 2020-11-26 | Disposition: A | Discharge status: Home / Self Care | Payer: Medicaid Other | Attending: Emergency Medicine | Admitting: Emergency Medicine

## 2020-11-26 DIAGNOSIS — Z7984 Long term (current) use of oral hypoglycemic drugs: Secondary | ICD-10-CM | POA: Diagnosis not present

## 2020-11-26 DIAGNOSIS — R202 Paresthesia of skin: Secondary | ICD-10-CM | POA: Diagnosis not present

## 2020-11-26 DIAGNOSIS — E119 Type 2 diabetes mellitus without complications: Secondary | ICD-10-CM | POA: Diagnosis not present

## 2020-11-26 DIAGNOSIS — R000 Tachycardia, unspecified: Secondary | ICD-10-CM | POA: Insufficient documentation

## 2020-11-26 DIAGNOSIS — Z794 Long term (current) use of insulin: Secondary | ICD-10-CM | POA: Insufficient documentation

## 2020-11-26 DIAGNOSIS — F17210 Nicotine dependence, cigarettes, uncomplicated: Secondary | ICD-10-CM | POA: Diagnosis not present

## 2020-11-26 LAB — URINALYSIS, ROUTINE W REFLEX MICROSCOPIC
Bilirubin Urine: NEGATIVE
Glucose, UA: NEGATIVE mg/dL
Hgb urine dipstick: NEGATIVE
Ketones, ur: NEGATIVE mg/dL
Nitrite: NEGATIVE
Protein, ur: 30 mg/dL — AB
Specific Gravity, Urine: 1.034 — ABNORMAL HIGH (ref 1.005–1.030)
pH: 5.5 (ref 5.0–8.0)

## 2020-11-26 LAB — CBC WITH DIFFERENTIAL/PLATELET
Abs Immature Granulocytes: 0.02 10*3/uL (ref 0.00–0.07)
Basophils Absolute: 0.1 10*3/uL (ref 0.0–0.1)
Basophils Relative: 1 %
Eosinophils Absolute: 0.3 10*3/uL (ref 0.0–0.5)
Eosinophils Relative: 2 %
HCT: 32 % — ABNORMAL LOW (ref 36.0–46.0)
Hemoglobin: 9.8 g/dL — ABNORMAL LOW (ref 12.0–15.0)
Immature Granulocytes: 0 %
Lymphocytes Relative: 35 %
Lymphs Abs: 5.1 10*3/uL — ABNORMAL HIGH (ref 0.7–4.0)
MCH: 23.7 pg — ABNORMAL LOW (ref 26.0–34.0)
MCHC: 30.6 g/dL (ref 30.0–36.0)
MCV: 77.5 fL — ABNORMAL LOW (ref 80.0–100.0)
Monocytes Absolute: 1 10*3/uL (ref 0.1–1.0)
Monocytes Relative: 7 %
Neutro Abs: 8.2 10*3/uL — ABNORMAL HIGH (ref 1.7–7.7)
Neutrophils Relative %: 55 %
Platelets: 285 10*3/uL (ref 150–400)
RBC: 4.13 MIL/uL (ref 3.87–5.11)
RDW: 17.2 % — ABNORMAL HIGH (ref 11.5–15.5)
WBC: 14.7 10*3/uL — ABNORMAL HIGH (ref 4.0–10.5)
nRBC: 0 % (ref 0.0–0.2)

## 2020-11-26 LAB — COMPREHENSIVE METABOLIC PANEL
ALT: 12 U/L (ref 0–44)
AST: 14 U/L — ABNORMAL LOW (ref 15–41)
Albumin: 4.4 g/dL (ref 3.5–5.0)
Alkaline Phosphatase: 49 U/L (ref 38–126)
Anion gap: 10 (ref 5–15)
BUN: 13 mg/dL (ref 6–20)
CO2: 21 mmol/L — ABNORMAL LOW (ref 22–32)
Calcium: 9 mg/dL (ref 8.9–10.3)
Chloride: 105 mmol/L (ref 98–111)
Creatinine, Ser: 0.68 mg/dL (ref 0.44–1.00)
GFR, Estimated: >60 mL/min (ref >60)
Glucose, Bld: 160 mg/dL — ABNORMAL HIGH (ref 70–99)
Potassium: 3.5 mmol/L (ref 3.5–5.1)
Sodium: 136 mmol/L (ref 135–145)
Total Bilirubin: 0.3 mg/dL (ref 0.3–1.2)
Total Protein: 6.7 g/dL (ref 6.5–8.1)

## 2020-11-26 LAB — PREGNANCY, URINE: Preg Test, Ur: NEGATIVE

## 2020-11-26 LAB — CBG MONITORING, ED: Glucose-Capillary: 165 mg/dL — ABNORMAL HIGH (ref 70–99)

## 2020-11-26 MED ORDER — SODIUM CHLORIDE 0.9 % IV BOLUS
1000.0000 mL | Freq: Once | INTRAVENOUS | Status: AC
  Administered 2020-11-26: 1000 mL via INTRAVENOUS

## 2020-11-26 NOTE — ED Provider Notes (Signed)
[facility]   CSN: [id number] Arrival date & time: 11/26/20  1245     History Chief Complaint  Patient presents with  . Numbness    Finger & toes    Brittany Wade is a 20 y.o. female.  Patient is a 20 year old female with a history of diabetes and hyperlipidemia who presents with finger numbness.  She reports about a 4-day history of some numbness in her left middle finger.  She says it is really only at the tip of the finger.  It has now spread to her second and fourth fingers.  She says the rest of her hand feels okay that he just has some numbness at the tips of the fingers.  She denies any pain.  No wrist pain.  No neck pain or shoulder pain.  No weakness in the arm.  She does do some repetitive motion at work but she says she normally works with her right arm.  She has been checking her blood sugars and says that they have been okay.  She is noted to be tachycardic.  She says that she overall feels okay.  She denies any new chest pain or shortness of breath.  No abdominal pain.  No nausea vomiting or diarrhea.  She has some loose stools which is normal for her about 3 times a day.  This is unchanged from her baseline.  No fevers.  No recent cough or cold symptoms.  No urinary symptoms.  Her last menstrual cycle was sometime in late February but she is not really sure.  She does not feel overly anxious.        Past Medical History:  Diagnosis Date  . Constipation   . Diabetes mellitus without complication [facility](HCC)     Patient Active Problem List   Diagnosis Date Noted  . Hyperlipidemia associated with type 2 diabetes mellitus (HCC) 10/04/2020  . Hidradenitis suppurativa 09/30/2018  . Gastroesophageal reflux disease without esophagitis 05/26/2018  . Type 2 diabetes mellitus with complication, without long-term current use of insulin (HCC) 05/21/2018  . Migraine 05/21/2018  . Anxiety state 04/04/2015  . Panic attack 04/04/2015  .  Circadian rhythm disorder 04/04/2015  . Tremor 04/04/2015    Past Surgical History:  Procedure Laterality Date  . ADENOIDECTOMY    . MOUTH SURGERY    . TONSILLECTOMY       OB History    Gravida  0   Para  0   Term  0   Preterm  0   AB  0   Living  0     SAB  0   IAB  0   Ectopic  0   Multiple  0   Live Births  0           Family History  Problem Relation Age of Onset  . Migraines Mother   . Mental illness Mother   . Bipolar disorder Mother   . Depression Mother   . Anxiety disorder Mother   . Seizures Mother   . Asperger's syndrome Sister   . ADD / ADHD Sister   . Migraines Maternal Grandmother   . Depression Maternal Grandmother   . Anxiety disorder Maternal Grandmother   . Epilepsy Other        maternal aunt  . ADD / ADHD Cousin     Social History   Tobacco Use  . Smoking status: Current Every Day Smoker    Packs/day: 0.50  Years: 4.00    Pack years: 2.00    Types: Cigarettes  . Smokeless tobacco: Never Used  Vaping Use  . Vaping Use: Never used  Substance Use Topics  . Alcohol use: No  . Drug use: No    Home Medications Prior to Admission medications   Medication Sig Start Date End Date Taking? Authorizing Provider  albuterol (VENTOLIN HFA) 108 (90 Base) MCG/ACT inhaler USE 2 PUFFS EVERY 6 HOURS AS NEEDED 08/30/20   Gwenlyn Fudge, FNP  atorvastatin (LIPITOR) 40 MG tablet Take 1 tablet (40 mg total) by mouth daily. 10/05/20   Gwenlyn Fudge, FNP  Continuous Blood Gluc Sensor (FREESTYLE LIBRE 2 SENSOR) MISC Use to test blood sugar 6 times daily as needed. DX: E11.65 09/13/20   Daryll Drown, NP  Dulaglutide (TRULICITY) 0.75 MG/0.5ML SOPN Inject 0.75 mg into the skin once a week. 09/05/20   Daryll Drown, NP  furosemide (LASIX) 20 MG tablet Take 1 tablet (20 mg total) by mouth as needed for edema (for edema). 06/30/20   Gabriel Earing, FNP  insulin glargine (LANTUS SOLOSTAR) 100 UNIT/ML Solostar Pen Inject 10 Units into the  skin daily. 09/13/20   Daryll Drown, NP  insulin glargine (LANTUS SOLOSTAR) 100 UNIT/ML Solostar Pen Inject 10 Units into the skin daily.    [provider]  Insulin Pen Needle (PEN NEEDLES) 32G X 4 MM MISC Use to inject insulin daily 09/13/20   Daryll Drown, NP  metFORMIN (GLUCOPHAGE) 1000 MG tablet Take 1 tablet (1,000 mg total) by mouth 2 (two) times daily with a meal. 10/04/20   Gwenlyn Fudge, FNP  naproxen (NAPROSYN) 500 MG tablet Take 500 mg by mouth 2 (two) times daily. 06/20/20   [provider]  omeprazole (PRILOSEC) 20 MG capsule TAKE ONE (1) CAPSULE EACH DAY 10/04/20   Gwenlyn Fudge, FNP  ondansetron (ZOFRAN ODT) 4 MG disintegrating tablet Take 1 tablet (4 mg total) by mouth every 8 (eight) hours as needed for nausea or vomiting. 10/04/20   Gwenlyn Fudge, FNP    Allergies    Red dye and Codeine  Review of Systems   Review of Systems  Constitutional: Negative for chills, diaphoresis, fatigue and fever.  HENT: Negative for congestion, rhinorrhea and sneezing.   Eyes: Negative.   Respiratory: Negative for cough, chest tightness and shortness of breath.   Cardiovascular: Negative for chest pain and leg swelling.  Gastrointestinal: Negative for abdominal pain, blood in stool, diarrhea, nausea and vomiting.  Genitourinary: Negative for difficulty urinating, flank pain, frequency and hematuria.  Musculoskeletal: Negative for arthralgias and back pain.  Skin: Negative for rash.  Neurological: Positive for numbness. Negative for dizziness, speech difficulty, weakness and headaches.    Physical Exam Updated Vital Signs BP 105/65   Pulse 81   Temp 98.3 F (36.8 C)   Resp 19   Ht 5\' 7"  (1.702 m)   Wt 83 kg   LMP 10/31/2020   SpO2 100%   BMI 28.66 kg/m   Physical Exam Constitutional:      Appearance: She is well-developed.  HENT:     Head: Normocephalic and atraumatic.  Eyes:     Pupils: Pupils are equal, round, and reactive to light.   Cardiovascular:     Rate and Rhythm: Regular rhythm. Tachycardia present.     Heart sounds: Normal heart sounds.  Pulmonary:     Effort: Pulmonary effort is normal. No respiratory distress.     Breath  sounds: Normal breath sounds. No wheezing or rales.  Chest:     Chest wall: No tenderness.  Abdominal:     General: Bowel sounds are normal.     Palpations: Abdomen is soft.     Tenderness: There is no abdominal tenderness. There is no guarding or rebound.  Musculoskeletal:        General: Normal range of motion.     Cervical back: Normal range of motion and neck supple.     Comments: She has some diminished sensation to light touch of the tip of the third fourth and fifth fingers of the left hand.  Capillary refill is less than 2 distally.  Radial pulses are intact.  She has normal sensation to the remainder of the hand.  There is no pain over the wrist.  No pain to the shoulder or cervical area.  Normal color in the hand.  Lymphadenopathy:     Cervical: No cervical adenopathy.  Skin:    General: Skin is warm and dry.     Findings: No rash.  Neurological:     General: No focal deficit present.     Mental Status: She is alert and oriented to person, place, and time.     Comments: No deficits other than some numbness to the tips of her third fourth and fifth fingers of her left hand.     ED Results / Procedures / Treatments   Labs (all labs ordered are listed, but only abnormal results are displayed) Labs Reviewed  COMPREHENSIVE METABOLIC PANEL - Abnormal; Notable for the following components:      Result Value   CO2 21 (*)    Glucose, Bld 160 (*)    AST 14 (*)    All other components within normal limits  CBC WITH DIFFERENTIAL/PLATELET - Abnormal; Notable for the following components:   WBC 14.7 (*)    Hemoglobin 9.8 (*)    HCT 32.0 (*)    MCV 77.5 (*)    MCH 23.7 (*)    RDW 17.2 (*)    Neutro Abs 8.2 (*)    Lymphs Abs 5.1 (*)    All other components within normal limits   URINALYSIS, ROUTINE W REFLEX MICROSCOPIC - Abnormal; Notable for the following components:   APPearance HAZY (*)    Specific Gravity, Urine 1.034 (*)    Protein, ur 30 (*)    Leukocytes,Ua TRACE (*)    All other components within normal limits  CBG MONITORING, ED - Abnormal; Notable for the following components:   Glucose-Capillary 165 (*)    All other components within normal limits  PREGNANCY, URINE    EKG None  Radiology No results found.  Procedures Procedures   Medications Ordered in ED Medications  sodium chloride 0.9 % bolus 1,000 mL (1,000 mLs Intravenous New Bag/Given 11/26/20 1333)    ED Course  I have reviewed the triage vital signs and the nursing notes.  Pertinent labs & imaging results that were available during my care of the patient were reviewed by me and considered in my medical decision making (see chart for details).    MDM Rules/Calculators/A&P                          Patient is a 20 year old female who presents with numbness in 3 fingers of her left hand.  She does not have any associated pain.  No weakness or other neurologic deficits.  She has good perfusion in  the hand.  No pain over the carpal tunnel area.  No pain to the neck or shoulder.  This is likely peripheral nerve injury.  She does not have any other symptoms that would be more suggestive of a stroke or other central etiology.  She was tachycardic on arrival and therefore labs were performed.  Her blood sugar is mildly elevated but she does not have any other abnormalities or concerns for DKA.  Her heart rate resolved spontaneously.  It appears that she was under a lot of anxiety and nervous when I was initially evaluating her which is subsequently resolved.  She was in a sinus rhythm and she has not had any apparent arrhythmias while she has been monitored here in the ED.  She was discharged home in good condition.  She will monitor her blood sugars and follow-up this week with her PCP.  Return  precautions were given. Final Clinical Impression(s) / ED Diagnoses Final diagnoses:  Paresthesia    Rx / DC Orders ED Discharge Orders    None       Rolan Bucco, MD 11/26/20 1454

## 2020-11-26 NOTE — ED Notes (Signed)
Pt's CBG result was 165. Informed Hang - RN.

## 2020-11-26 NOTE — ED Triage Notes (Signed)
Pt c/o numbness in fingers, first starting as L middle finger, x5 days. Hx of Type 1 DM. C/o generalized fatigue & dizziness.

## 2020-12-26 ENCOUNTER — Other Ambulatory Visit: Payer: Self-pay | Admitting: *Deleted

## 2020-12-26 ENCOUNTER — Ambulatory Visit: Payer: Medicaid Other | Admitting: Women's Health

## 2020-12-26 ENCOUNTER — Encounter: Payer: Self-pay | Admitting: Women's Health

## 2020-12-26 ENCOUNTER — Other Ambulatory Visit: Payer: Self-pay

## 2020-12-26 VITALS — BP 114/77 | HR 112 | Ht 67.0 in | Wt 171.0 lb

## 2020-12-26 DIAGNOSIS — Z3201 Encounter for pregnancy test, result positive: Secondary | ICD-10-CM

## 2020-12-26 DIAGNOSIS — Z3A01 Less than 8 weeks gestation of pregnancy: Secondary | ICD-10-CM | POA: Diagnosis not present

## 2020-12-26 DIAGNOSIS — Z3491 Encounter for supervision of normal pregnancy, unspecified, first trimester: Secondary | ICD-10-CM

## 2020-12-26 DIAGNOSIS — E118 Type 2 diabetes mellitus with unspecified complications: Secondary | ICD-10-CM

## 2020-12-26 LAB — POCT URINE PREGNANCY: Preg Test, Ur: POSITIVE — AB

## 2020-12-26 NOTE — Patient Instructions (Addendum)
Stop Lasix  Brittany Wade, I greatly value your feedback.  If you receive a survey following your visit with us today, we appreciate you taking the time to fill it out.  Thanks, [name] Eliott Amparan, CNM, WHNP-BC   [facility] ([address] [place], [place] [id number]) Entrance C, located off of [address] Free 24/7 valet parking   Check blood sugars 4 times a day: in the morning before eating/drinking anything (<95) and 2 hours after your first bite of breakfast, lunch, and supper (<120).  Bring your log to each appointment    Nausea & Vomiting  Have saltine crackers or pretzels by your bed and eat a few bites before you raise your head out of bed in the morning  Eat small frequent meals throughout the day instead of large meals  Drink plenty of fluids throughout the day to stay hydrated, just don't drink a lot of fluids with your meals.  This can make your stomach fill up faster making you feel sick  Do not brush your teeth right after you eat  Products with real ginger are good for nausea, like ginger ale and ginger hard candy Make sure it says made with real ginger!  Sucking on sour candy like lemon heads is also good for nausea  If your prenatal vitamins make you nauseated, take them at night so you will sleep through the nausea  Sea Bands  If you feel like you need medicine for the nausea & vomiting please let us know  If you are unable to keep any fluids or food down please let us know   Constipation  Drink plenty of fluid, preferably water, throughout the day  Eat foods high in fiber such as fruits, vegetables, and grains  Exercise, such as walking, is a good way to keep your bowels regular  Drink warm fluids, especially warm prune juice, or decaf coffee  Eat a 1/2 cup of real oatmeal (not instant), 1/2 cup applesauce, and 1/2-1 cup warm prune juice every day  If needed, you may take Colace (docusate sodium) stool softener once or twice a  day to help keep the stool soft.   If you still are having problems with constipation, you may take Miralax once daily as needed to help keep your bowels regular.   Home Blood Pressure Monitoring for Patients   Your provider has recommended that you check your blood pressure (BP) at least once a week at home. If you do not have a blood pressure cuff at home, one will be provided for you. Contact your provider if you have not received your monitor within 1 week.   Helpful Tips for Accurate Home Blood Pressure Checks  . Don't smoke, exercise, or drink caffeine 30 minutes before checking your BP . Use the restroom before checking your BP (a full bladder can raise your pressure) . Relax in a comfortable upright chair . Feet on the ground . Left arm resting comfortably on a flat surface at the level of your heart . Legs uncrossed . Back supported . Sit quietly and don't talk . Place the cuff on your bare arm . Adjust snuggly, so that only two fingertips can fit between your skin and the top of the cuff . Check 2 readings separated by at least one minute . Keep a log of your BP readings . For a visual, please reference this diagram: http://ccnc.care/bpdiagram  [facility]  Phone: [phone]  Zone 1: ALL CLEAR  Continue to monitor your symptoms:  . BP reading is less than 140 (top number) or less than 90 (bottom number)  . No right upper stomach pain . No headaches or seeing spots . No feeling nauseated or throwing up . No swelling in face and hands  Zone 2: CAUTION Call your doctor's office for any of the following:  . BP reading is greater than 140 (top number) or greater than 90 (bottom number)  . Stomach pain under your ribs in the middle or right side . Headaches or seeing spots . Feeling nauseated or throwing up . Swelling in face and hands  Zone 3: EMERGENCY  Seek immediate medical care if you have any of the following:  . BP reading is greater  than160 (top number) or greater than 110 (bottom number) . Severe headaches not improving with Tylenol . Serious difficulty catching your breath . Any worsening symptoms from Zone 2    First Trimester of Pregnancy The first trimester of pregnancy is from week 1 until the end of week 12 (months 1 through 3). A week after a sperm fertilizes an egg, the egg will implant on the wall of the uterus. This embryo will begin to develop into a baby. Genes from you and your partner are forming the baby. The female genes determine whether the baby is a boy or a girl. At 6-8 weeks, the eyes and face are formed, and the heartbeat can be seen on ultrasound. At the end of 12 weeks, all the baby's organs are formed.  Now that you are pregnant, you will want to do everything you can to have a healthy baby. Two of the most important things are to get good prenatal care and to follow your health care provider's instructions. Prenatal care is all the medical care you receive before the baby's birth. This care will help prevent, find, and treat any problems during the pregnancy and childbirth. BODY CHANGES Your body goes through many changes during pregnancy. The changes vary from woman to woman.   You may gain or lose a couple of pounds at first.  You may feel sick to your stomach (nauseous) and throw up (vomit). If the vomiting is uncontrollable, call your health care provider.  You may tire easily.  You may develop headaches that can be relieved by medicines approved by your health care provider.  You may urinate more often. Painful urination may mean you have a bladder infection.  You may develop heartburn as a result of your pregnancy.  You may develop constipation because certain hormones are causing the muscles that push waste through your intestines to slow down.  You may develop hemorrhoids or swollen, bulging veins (varicose veins).  Your breasts may begin to grow larger and become tender. Your nipples  may stick out more, and the tissue that surrounds them (areola) may become darker.  Your gums may bleed and may be sensitive to brushing and flossing.  Dark spots or blotches (chloasma, mask of pregnancy) may develop on your face. This will likely fade after the baby is born.  Your menstrual periods will stop.  You may have a loss of appetite.  You may develop cravings for certain kinds of food.  You may have changes in your emotions from day to day, such as being excited to be pregnant or being concerned that something may go wrong with the pregnancy and baby.  You may have more vivid and strange  dreams.  You may have changes in your hair. These can include thickening of your hair, rapid growth, and changes in texture. Some women also have hair loss during or after pregnancy, or hair that feels dry or thin. Your hair will most likely return to normal after your baby is born. WHAT TO EXPECT AT YOUR PRENATAL VISITS During a routine prenatal visit:  You will be weighed to make sure you and the baby are growing normally.  Your blood pressure will be taken.  Your abdomen will be measured to track your baby's growth.  The fetal heartbeat will be listened to starting around week 10 or 12 of your pregnancy.  Test results from any previous visits will be discussed. Your health care provider may ask you:  How you are feeling.  If you are feeling the baby move.  If you have had any abnormal symptoms, such as leaking fluid, bleeding, severe headaches, or abdominal cramping.  If you have any questions. Other tests that may be performed during your first trimester include:  Blood tests to find your blood type and to check for the presence of any previous infections. They will also be used to check for low iron levels (anemia) and Rh antibodies. Later in the pregnancy, blood tests for diabetes will be done along with other tests if problems develop.  Urine tests to check for infections,  diabetes, or protein in the urine.  An ultrasound to confirm the proper growth and development of the baby.  An amniocentesis to check for possible genetic problems.  Fetal screens for spina bifida and Down syndrome.  You may need other tests to make sure you and the baby are doing well. HOME CARE INSTRUCTIONS  Medicines  Follow your health care provider's instructions regarding medicine use. Specific medicines may be either safe or unsafe to take during pregnancy.  Take your prenatal vitamins as directed.  If you develop constipation, try taking a stool softener if your health care provider approves. Diet  Eat regular, well-balanced meals. Choose a variety of foods, such as meat or vegetable-based protein, fish, milk and low-fat dairy products, vegetables, fruits, and whole grain breads and cereals. Your health care provider will help you determine the amount of weight gain that is right for you.  Avoid raw meat and uncooked cheese. These carry germs that can cause birth defects in the baby.  Eating four or five small meals rather than three large meals a day may help relieve nausea and vomiting. If you start to feel nauseous, eating a few soda crackers can be helpful. Drinking liquids between meals instead of during meals also seems to help nausea and vomiting.  If you develop constipation, eat more high-fiber foods, such as fresh vegetables or fruit and whole grains. Drink enough fluids to keep your urine clear or pale yellow. Activity and Exercise  Exercise only as directed by your health care provider. Exercising will help you:  Control your weight.  Stay in shape.  Be prepared for labor and delivery.  Experiencing pain or cramping in the lower abdomen or low back is a good sign that you should stop exercising. Check with your health care provider before continuing normal exercises.  Try to avoid standing for long periods of time. Move your legs often if you must stand in  one place for a long time.  Avoid heavy lifting.  Wear low-heeled shoes, and practice good posture.  You may continue to have sex unless your health care provider directs  you otherwise. Relief of Pain or Discomfort  Wear a good support bra for breast tenderness.    Take warm sitz baths to soothe any pain or discomfort caused by hemorrhoids. Use hemorrhoid cream if your health care provider approves.    Rest with your legs elevated if you have leg cramps or low back pain.  If you develop varicose veins in your legs, wear support hose. Elevate your feet for 15 minutes, 3-4 times a day. Limit salt in your diet. Prenatal Care  Schedule your prenatal visits by the twelfth week of pregnancy. They are usually scheduled monthly at first, then more often in the last 2 months before delivery.  Write down your questions. Take them to your prenatal visits.  Keep all your prenatal visits as directed by your health care provider. Safety  Wear your seat belt at all times when driving.  Make a list of emergency phone numbers, including numbers for family, friends, the hospital, and police and fire departments. General Tips  Ask your health care provider for a referral to a local prenatal education class. Begin classes no later than at the beginning of month 6 of your pregnancy.  Ask for help if you have counseling or nutritional needs during pregnancy. Your health care provider can offer advice or refer you to specialists for help with various needs.  Do not use hot tubs, steam rooms, or saunas.  Do not douche or use tampons or scented sanitary pads.  Do not cross your legs for long periods of time.  Avoid cat litter boxes and soil used by cats. These carry germs that can cause birth defects in the baby and possibly loss of the fetus by miscarriage or stillbirth.  Avoid all smoking, herbs, alcohol, and medicines not prescribed by your health care provider. Chemicals in these affect the  formation and growth of the baby.  Schedule a dentist appointment. At home, brush your teeth with a soft toothbrush and be gentle when you floss. SEEK MEDICAL CARE IF:   You have dizziness.  You have mild pelvic cramps, pelvic pressure, or nagging pain in the abdominal area.  You have persistent nausea, vomiting, or diarrhea.  You have a bad smelling vaginal discharge.  You have pain with urination.  You notice increased swelling in your face, hands, legs, or ankles. SEEK IMMEDIATE MEDICAL CARE IF:   You have a fever.  You are leaking fluid from your vagina.  You have spotting or bleeding from your vagina.  You have severe abdominal cramping or pain.  You have rapid weight gain or loss.  You vomit blood or material that looks like coffee grounds.  You are exposed to [place] measles and have never had them.  You are exposed to fifth disease or chickenpox.  You develop a severe headache.  You have shortness of breath.  You have any kind of trauma, such as from a fall or a car accident. Document Released: 08/14/2001 Document Revised: 01/04/2014 Document Reviewed: 06/30/2013 [facility] Patient Information 2015 [place], [place]. This information is not intended to replace advice given to you by your health care provider. Make sure you discuss any questions you have with your health care provider.

## 2020-12-26 NOTE — Progress Notes (Signed)
   GYN VISIT Patient name: Brittany Wade MRN [id number]  Date of birth: [birth year] Chief Complaint:   Possible Pregnancy (Has had 6+ UPT @ home)  History of Present Illness:   Brittany Wade is a 20 y.o. G72P0000 Caucasian female [redacted]w[redacted]d by certain LMP of 3/3,being seen today for +HPT.   T2DM dx at 20yo, currently on metformin 1,000mg  BID and Lantus 10u qhs. Last A1C 6.9 in Feb. Has never seen dietician. Also on lasix prn dependent edema, hasn't had to take since pregnant. On Lipitor, took 1 bottle, didn't get refilled. Some mild n/v, declines meds. Taking pnv.  Depression screen [facility] 2/9 12/26/2020 10/04/2020 09/05/2020 06/30/2020 03/25/2020  Decreased Interest 0 0 0 0 0  Down, Depressed, Hopeless 0 0 0 0 0  PHQ - 2 Score 0 0 0 0 0  Altered sleeping 0 - - - -  Tired, decreased energy 1 - - - -  Change in appetite 1 - - - -  Feeling bad or failure about yourself  0 - - - -  Trouble concentrating 0 - - - -  Moving slowly or fidgety/restless 0 - - - -  Suicidal thoughts 0 - - - -  PHQ-9 Score 2 - - - -  Difficult doing work/chores - - - - -    Patient's last menstrual period was 11/03/2020 (approximate). Review of Systems:   Pertinent items are noted in HPI Denies fever/chills, dizziness, headaches, visual disturbances, fatigue, shortness of breath, chest pain, abdominal pain, vomiting, abnormal vaginal discharge/itching/odor/irritation, problems with periods, bowel movements, urination, or intercourse unless otherwise stated above.  Pertinent History Reviewed:  Reviewed past medical,surgical, social, obstetrical and family history.  Reviewed problem list, medications and allergies. Physical Assessment:   Vitals:   12/26/20 1438  BP: 114/77  Pulse: (!) 112  Weight: 171 lb (77.6 kg)  Height: 5\' 7"  (1.702 m)  Body mass index is 26.78 kg/m.       Physical Examination:   General appearance: alert, well appearing, and in no distress  Mental status: alert, oriented to person, place, and  time  Skin: warm & dry   Cardiovascular: normal heart rate noted  Respiratory: normal respiratory effort, no distress  Abdomen: soft, non-tender   Pelvic: examination not indicated  Extremities: no edema   Chaperone: N/A    Results for orders placed or performed in visit on 12/26/20 (from the past 24 hour(s))  POCT urine pregnancy   Collection Time: 12/26/20  2:37 PM  Result Value Ref Range   Preg Test, Ur Positive (A) Negative    Assessment & Plan:  1) [redacted]w[redacted]d pregnant> by certain LMP of 3/3, will get dating u/s, continue pnv  2) T2DM dx @ 20yo> continue metformin 1,000mg  BID, Lantus 10u qhs. Last A1C 6.9 in Feb. Start checking QID sugars (gave printed instructions/goals), bring log to appts. Has never had appt w/ dietician. Note routed to Tish to order. Stop lasix and lipitor  Meds: No orders of the defined types were placed in this encounter.   Orders Placed This Encounter  Procedures  . Mar OB Comp Less 14 Wks  . POCT urine pregnancy    Return in about 2 weeks (around 01/09/2021) for dating u/s.  03/11/2021 CNM, [facility] 12/26/2020 3:23 PM

## 2020-12-28 ENCOUNTER — Other Ambulatory Visit: Payer: Medicaid Other | Admitting: Registered"

## 2020-12-28 ENCOUNTER — Encounter: Payer: Medicaid Other | Attending: Advanced Practice Midwife | Admitting: Registered"

## 2020-12-28 ENCOUNTER — Other Ambulatory Visit: Payer: Self-pay | Admitting: *Deleted

## 2020-12-28 ENCOUNTER — Other Ambulatory Visit: Payer: Self-pay

## 2020-12-28 DIAGNOSIS — O24119 Pre-existing diabetes mellitus, type 2, in pregnancy, unspecified trimester: Secondary | ICD-10-CM | POA: Insufficient documentation

## 2020-12-29 ENCOUNTER — Other Ambulatory Visit: Payer: Self-pay | Admitting: *Deleted

## 2020-12-29 DIAGNOSIS — E118 Type 2 diabetes mellitus with unspecified complications: Secondary | ICD-10-CM

## 2020-12-29 MED ORDER — ACCU-CHEK GUIDE VI STRP: ORAL_STRIP | 12 refills | Status: DC

## 2020-12-29 MED ORDER — ACCU-CHEK SOFTCLIX LANCETS MISC
12 refills | Status: DC
Start: 2020-12-29 — End: 2024-05-27

## 2020-12-29 MED ORDER — DEXCOM G6 TRANSMITTER MISC: 0 refills | Status: DC

## 2020-12-29 MED ORDER — DEXCOM G6 RECEIVER DEVI: 0 refills | Status: DC

## 2020-12-29 MED ORDER — DEXCOM G6 SENSOR MISC: 12 refills | Status: DC

## 2020-12-30 ENCOUNTER — Telehealth: Payer: Self-pay

## 2020-12-30 DIAGNOSIS — O24119 Pre-existing diabetes mellitus, type 2, in pregnancy, unspecified trimester: Secondary | ICD-10-CM | POA: Insufficient documentation

## 2020-12-30 NOTE — Progress Notes (Signed)
Patient was seen on 12/28/20 for Type 2 Diabetes in pregnancy self-management. EDD 08/10/21  In addition to the following learning objectives we also discussed Dexcom continuous monitor and Omnipod disposable insulin pump. Omnipod representative will be contacting patient for next steps for starting Omnipod. Will meet with patient again when we have more blood sugar data to review dietary intake in more detail   States the definition of Gestational Diabetes  States why dietary management is important in controlling blood glucose  Describes the effects of carbohydrates on blood glucose levels  Demonstrates ability to create a balanced meal plan  Demonstrates carbohydrate counting   States when to check blood glucose levels  Demonstrates proper blood glucose monitoring techniques  States the effect of stress and exercise on blood glucose levels  States the importance of limiting caffeine and abstaining from alcohol and smoking  Plan:  Aim for 3 Carbohydrate Choices per meal (45 grams) +/- 1 either way  Aim for 1-2 Carbohydrate Choices per snack Begin reading food labels for Total Carbohydrate of foods If OK with your MD, consider  increasing your activity level by walking, Arm Chair Exercises or other activity daily as tolerated Begin checking Blood Glucose before breakfast and 2 hours after first bite of breakfast, lunch and dinner as directed by MD  Bring Log Book/Sheet and meter to every medical appointment  Take medication if directed by MD  Blood glucose monitor given: Accu-chek Guide Me Lot #[id number] Exp: 2022/01/06/ CBG: 138 mg/dL  Rx order placed for  Accu-chek Guide strips and Softclix lancets  Patient already has a OTC meter, is testing 1-2x/day; states FBG is 171, 206; Pt reports 1 hrs post meal 120, 149.  Patient instructed to monitor glucose levels: FBS: 70 - 95 mg/dl 2 hour: <299 mg/dl  Patient received the following handouts:  Pre-existing Diabetes in  Pregnancy  Carbohydrate Counting List  Blood glucose Log Sheet  Patient will be seen for follow-up in 2 weeks or as needed.

## 2020-12-30 NOTE — Telephone Encounter (Signed)
Pt called stating that she had a dietary visit at the [facility] office recently and her doctor recommended she start taking Omni Pods. The doctor reached out to a Rep for [employer] and the Rep Bjorn Loser) called pt about it. Bjorn Loser said that she would need speak with pt's PCP who is Britney before she could try it.  Pt wants to know if Brayton El can Bjorn Loser at [phone]

## 2020-12-30 NOTE — Telephone Encounter (Signed)
I have spoken to [place].  I will not be the provider managing her diabetes during her pregnancy.  Dr. Forestine Chute office is actually the ones that made the referral for OmniPod's.  She is going to reach out to them.

## 2021-01-12 ENCOUNTER — Other Ambulatory Visit: Payer: Self-pay

## 2021-01-12 ENCOUNTER — Ambulatory Visit (INDEPENDENT_AMBULATORY_CARE_PROVIDER_SITE_OTHER): Payer: Medicaid Other

## 2021-01-12 DIAGNOSIS — Z3A01 Less than 8 weeks gestation of pregnancy: Secondary | ICD-10-CM

## 2021-01-12 DIAGNOSIS — Z3491 Encounter for supervision of normal pregnancy, unspecified, first trimester: Secondary | ICD-10-CM

## 2021-01-12 NOTE — Progress Notes (Signed)
[place] 10 wks,single IUP with ys,fhr 178 bpm,CRL 25.91 mm,normal ovaries,limited view of left ovary

## 2021-01-18 ENCOUNTER — Other Ambulatory Visit: Payer: Medicaid Other

## 2021-02-07 ENCOUNTER — Ambulatory Visit: Payer: Medicaid Other | Admitting: *Deleted

## 2021-02-07 ENCOUNTER — Encounter: Payer: Self-pay | Admitting: Women's Health

## 2021-02-07 ENCOUNTER — Other Ambulatory Visit: Payer: Self-pay

## 2021-02-07 ENCOUNTER — Ambulatory Visit (INDEPENDENT_AMBULATORY_CARE_PROVIDER_SITE_OTHER): Payer: Medicaid Other | Admitting: Women's Health

## 2021-02-07 ENCOUNTER — Other Ambulatory Visit (HOSPITAL_COMMUNITY)
Admission: RE | Admit: 2021-02-07 | Discharge: 2021-02-07 | Disposition: A | Discharge status: Home / Self Care | Payer: Medicaid Other | Source: Ambulatory Visit | Attending: Obstetrics & Gynecology | Admitting: Obstetrics & Gynecology

## 2021-02-07 VITALS — BP 123/75 | HR 100 | Wt 174.0 lb

## 2021-02-07 DIAGNOSIS — O0991 Supervision of high risk pregnancy, unspecified, first trimester: Secondary | ICD-10-CM

## 2021-02-07 DIAGNOSIS — Z3A13 13 weeks gestation of pregnancy: Secondary | ICD-10-CM | POA: Insufficient documentation

## 2021-02-07 DIAGNOSIS — Z113 Encounter for screening for infections with a predominantly sexual mode of transmission: Secondary | ICD-10-CM | POA: Diagnosis present

## 2021-02-07 DIAGNOSIS — E118 Type 2 diabetes mellitus with unspecified complications: Secondary | ICD-10-CM

## 2021-02-07 DIAGNOSIS — Z124 Encounter for screening for malignant neoplasm of cervix: Secondary | ICD-10-CM | POA: Insufficient documentation

## 2021-02-07 DIAGNOSIS — O0990 Supervision of high risk pregnancy, unspecified, unspecified trimester: Secondary | ICD-10-CM | POA: Insufficient documentation

## 2021-02-07 DIAGNOSIS — O24311 Unspecified pre-existing diabetes mellitus in pregnancy, first trimester: Secondary | ICD-10-CM

## 2021-02-07 LAB — POCT URINALYSIS DIPSTICK OB
Blood, UA: NEGATIVE
Glucose, UA: NEGATIVE
Ketones, UA: NEGATIVE
Leukocytes, UA: NEGATIVE
Nitrite, UA: NEGATIVE
POC,PROTEIN,UA: NEGATIVE

## 2021-02-07 MED ORDER — BLOOD PRESSURE MONITOR MISC: 0 refills | Status: DC

## 2021-02-07 MED ORDER — ASPIRIN 81 MG PO TBEC: 81.0000 mg | DELAYED_RELEASE_TABLET | Freq: Every day | ORAL | 3 refills | Status: DC

## 2021-02-07 NOTE — Patient Instructions (Addendum)
Brittany Wade, thank you for choosing our office today! We appreciate the opportunity to meet your healthcare needs. You may receive a short survey by mail, e-mail, or through [employer]. If you are happy with your care we would appreciate if you could take just a few minutes to complete the survey questions. We read all of your comments and take your feedback very seriously. Thank you again for choosing our office.  Center for [facility] Team at [facility], [facility], [facility]  [facility] at [facility] ([address] [place], [place] [id number]) Entrance C, located off of [address] Free 24/7 valet parking   Increase Lantus to 12u at night (if 2hr sugars after meals are still towards 150s, can go up to 14u)  Nausea & Vomiting  Have saltine crackers or pretzels by your bed and eat a few bites before you raise your head out of bed in the morning  Eat small frequent meals throughout the day instead of large meals  Drink plenty of fluids throughout the day to stay hydrated, just don't drink a lot of fluids with your meals.  This can make your stomach fill up faster making you feel sick  Do not brush your teeth right after you eat  Products with real ginger are good for nausea, like ginger ale and ginger hard candy Make sure it says made with real ginger!  Sucking on sour candy like lemon heads is also good for nausea  If your prenatal vitamins make you nauseated, take them at night so you will sleep through the nausea  Sea Bands  If you feel like you need medicine for the nausea & vomiting please let us know  If you are unable to keep any fluids or food down please let us know   Constipation  Drink plenty of fluid, preferably water, throughout the day  Eat foods high in fiber such as fruits, vegetables, and grains  Exercise, such as walking, is a good way to keep your bowels regular  Drink warm fluids, especially warm prune juice, or decaf coffee  Eat a 1/2 cup of real oatmeal (not  instant), 1/2 cup applesauce, and 1/2-1 cup warm prune juice every day  If needed, you may take Colace (docusate sodium) stool softener once or twice a day to help keep the stool soft.   If you still are having problems with constipation, you may take Miralax once daily as needed to help keep your bowels regular.   Home Blood Pressure Monitoring for Patients   Your provider has recommended that you check your blood pressure (BP) at least once a week at home. If you do not have a blood pressure cuff at home, one will be provided for you. Contact your provider if you have not received your monitor within 1 week.   Helpful Tips for Accurate Home Blood Pressure Checks  . Don't smoke, exercise, or drink caffeine 30 minutes before checking your BP . Use the restroom before checking your BP (a full bladder can raise your pressure) . Relax in a comfortable upright chair . Feet on the ground . Left arm resting comfortably on a flat surface at the level of your heart . Legs uncrossed . Back supported . Sit quietly and don't talk . Place the cuff on your bare arm . Adjust snuggly, so that only two fingertips can fit between your skin and the top of the cuff . Check 2 readings separated by at least one minute . Keep a  log of your BP readings . For a visual, please reference this diagram: http://ccnc.care/bpdiagram  Provider Name: [facility]     Phone: [phone]  Zone 1: ALL CLEAR  Continue to monitor your symptoms:  . BP reading is less than 140 (top number) or less than 90 (bottom number)  . No right upper stomach pain . No headaches or seeing spots . No feeling nauseated or throwing up . No swelling in face and hands  Zone 2: CAUTION Call your doctor's office for any of the following:  . BP reading is greater than 140 (top number) or greater than 90 (bottom number)  . Stomach pain under your ribs in the middle or right side . Headaches or seeing spots . Feeling nauseated or  throwing up . Swelling in face and hands  Zone 3: EMERGENCY  Seek immediate medical care if you have any of the following:  . BP reading is greater than160 (top number) or greater than 110 (bottom number) . Severe headaches not improving with Tylenol . Serious difficulty catching your breath . Any worsening symptoms from Zone 2    First Trimester of Pregnancy The first trimester of pregnancy is from week 1 until the end of week 12 (months 1 through 3). A week after a sperm fertilizes an egg, the egg will implant on the wall of the uterus. This embryo will begin to develop into a baby. Genes from you and your partner are forming the baby. The female genes determine whether the baby is a boy or a girl. At 6-8 weeks, the eyes and face are formed, and the heartbeat can be seen on ultrasound. At the end of 12 weeks, all the baby's organs are formed.  Now that you are pregnant, you will want to do everything you can to have a healthy baby. Two of the most important things are to get good prenatal care and to follow your health care provider's instructions. Prenatal care is all the medical care you receive before the baby's birth. This care will help prevent, find, and treat any problems during the pregnancy and childbirth. BODY CHANGES Your body goes through many changes during pregnancy. The changes vary from woman to woman.   You may gain or lose a couple of pounds at first.  You may feel sick to your stomach (nauseous) and throw up (vomit). If the vomiting is uncontrollable, call your health care provider.  You may tire easily.  You may develop headaches that can be relieved by medicines approved by your health care provider.  You may urinate more often. Painful urination may mean you have a bladder infection.  You may develop heartburn as a result of your pregnancy.  You may develop constipation because certain hormones are causing the muscles that push waste through your intestines to slow  down.  You may develop hemorrhoids or swollen, bulging veins (varicose veins).  Your breasts may begin to grow larger and become tender. Your nipples may stick out more, and the tissue that surrounds them (areola) may become darker.  Your gums may bleed and may be sensitive to brushing and flossing.  Dark spots or blotches (chloasma, mask of pregnancy) may develop on your face. This will likely fade after the baby is born.  Your menstrual periods will stop.  You may have a loss of appetite.  You may develop cravings for certain kinds of food.  You may have changes in your emotions from day to day, such as being excited  to be pregnant or being concerned that something may go wrong with the pregnancy and baby.  You may have more vivid and strange dreams.  You may have changes in your hair. These can include thickening of your hair, rapid growth, and changes in texture. Some women also have hair loss during or after pregnancy, or hair that feels dry or thin. Your hair will most likely return to normal after your baby is born. WHAT TO EXPECT AT YOUR PRENATAL VISITS During a routine prenatal visit:  You will be weighed to make sure you and the baby are growing normally.  Your blood pressure will be taken.  Your abdomen will be measured to track your baby's growth.  The fetal heartbeat will be listened to starting around week 10 or 12 of your pregnancy.  Test results from any previous visits will be discussed. Your health care provider may ask you:  How you are feeling.  If you are feeling the baby move.  If you have had any abnormal symptoms, such as leaking fluid, bleeding, severe headaches, or abdominal cramping.  If you have any questions. Other tests that may be performed during your first trimester include:  Blood tests to find your blood type and to check for the presence of any previous infections. They will also be used to check for low iron levels (anemia) and Rh  antibodies. Later in the pregnancy, blood tests for diabetes will be done along with other tests if problems develop.  Urine tests to check for infections, diabetes, or protein in the urine.  An ultrasound to confirm the proper growth and development of the baby.  An amniocentesis to check for possible genetic problems.  Fetal screens for spina bifida and Down syndrome.  You may need other tests to make sure you and the baby are doing well. HOME CARE INSTRUCTIONS  Medicines  Follow your health care provider's instructions regarding medicine use. Specific medicines may be either safe or unsafe to take during pregnancy.  Take your prenatal vitamins as directed.  If you develop constipation, try taking a stool softener if your health care provider approves. Diet  Eat regular, well-balanced meals. Choose a variety of foods, such as meat or vegetable-based protein, fish, milk and low-fat dairy products, vegetables, fruits, and whole grain breads and cereals. Your health care provider will help you determine the amount of weight gain that is right for you.  Avoid raw meat and uncooked cheese. These carry germs that can cause birth defects in the baby.  Eating four or five small meals rather than three large meals a day may help relieve nausea and vomiting. If you start to feel nauseous, eating a few soda crackers can be helpful. Drinking liquids between meals instead of during meals also seems to help nausea and vomiting.  If you develop constipation, eat more high-fiber foods, such as fresh vegetables or fruit and whole grains. Drink enough fluids to keep your urine clear or pale yellow. Activity and Exercise  Exercise only as directed by your health care provider. Exercising will help you:  Control your weight.  Stay in shape.  Be prepared for labor and delivery.  Experiencing pain or cramping in the lower abdomen or low back is a good sign that you should stop exercising. Check  with your health care provider before continuing normal exercises.  Try to avoid standing for long periods of time. Move your legs often if you must stand in one place for a long time.  Avoid heavy lifting.  Wear low-heeled shoes, and practice good posture.  You may continue to have sex unless your health care provider directs you otherwise. Relief of Pain or Discomfort  Wear a good support bra for breast tenderness.    Take warm sitz baths to soothe any pain or discomfort caused by hemorrhoids. Use hemorrhoid cream if your health care provider approves.    Rest with your legs elevated if you have leg cramps or low back pain.  If you develop varicose veins in your legs, wear support hose. Elevate your feet for 15 minutes, 3-4 times a day. Limit salt in your diet. Prenatal Care  Schedule your prenatal visits by the twelfth week of pregnancy. They are usually scheduled monthly at first, then more often in the last 2 months before delivery.  Write down your questions. Take them to your prenatal visits.  Keep all your prenatal visits as directed by your health care provider. Safety  Wear your seat belt at all times when driving.  Make a list of emergency phone numbers, including numbers for family, friends, the hospital, and police and fire departments. General Tips  Ask your health care provider for a referral to a local prenatal education class. Begin classes no later than at the beginning of month 6 of your pregnancy.  Ask for help if you have counseling or nutritional needs during pregnancy. Your health care provider can offer advice or refer you to specialists for help with various needs.  Do not use hot tubs, steam rooms, or saunas.  Do not douche or use tampons or scented sanitary pads.  Do not cross your legs for long periods of time.  Avoid cat litter boxes and soil used by cats. These carry germs that can cause birth defects in the baby and possibly loss of the fetus  by miscarriage or stillbirth.  Avoid all smoking, herbs, alcohol, and medicines not prescribed by your health care provider. Chemicals in these affect the formation and growth of the baby.  Schedule a dentist appointment. At home, brush your teeth with a soft toothbrush and be gentle when you floss. SEEK MEDICAL CARE IF:   You have dizziness.  You have mild pelvic cramps, pelvic pressure, or nagging pain in the abdominal area.  You have persistent nausea, vomiting, or diarrhea.  You have a bad smelling vaginal discharge.  You have pain with urination.  You notice increased swelling in your face, hands, legs, or ankles. SEEK IMMEDIATE MEDICAL CARE IF:   You have a fever.  You are leaking fluid from your vagina.  You have spotting or bleeding from your vagina.  You have severe abdominal cramping or pain.  You have rapid weight gain or loss.  You vomit blood or material that looks like coffee grounds.  You are exposed to [place] measles and have never had them.  You are exposed to fifth disease or chickenpox.  You develop a severe headache.  You have shortness of breath.  You have any kind of trauma, such as from a fall or a car accident. Document Released: 08/14/2001 Document Revised: 01/04/2014 Document Reviewed: 06/30/2013 [facility] Patient Information 2015 [place], [place]. This information is not intended to replace advice given to you by your health care provider. Make sure you discuss any questions you have with your health care provider.

## 2021-02-07 NOTE — Progress Notes (Signed)
INITIAL OBSTETRICAL VISIT Patient name: Brittany Wade MRN [id number]  Date of birth: Dec 25, 2000 Chief Complaint:   Initial Prenatal Visit (constipation)  History of Present Illness:   Brittany Wade is a 20 y.o. G61P0000 Caucasian female at [redacted]w[redacted]d by LMP c/w u/s at 9 weeks with an Estimated Date of Delivery: 08/10/21 being seen today for her initial obstetrical visit.   Patient's last menstrual period was 11/03/2020 (approximate). Her obstetrical history is significant for primigravida.   T2DM dx @ 20yo, eye exam Aug 2021 wnl, last A1C 6.9 in Feb. Currently on metformin 1,000mg  BID and Lantus 10u qhs. Saw dietician 4/27. Today she reports FBS 100-164, 2hr pp 120s-130s.  Last pap never. Results were: N/A  Depression screen [facility] 2/9 02/07/2021 12/26/2020 10/04/2020 09/05/2020 06/30/2020  Decreased Interest 1 0 0 0 0  Down, Depressed, Hopeless 0 0 0 0 0  PHQ - 2 Score 1 0 0 0 0  Altered sleeping 1 0 - - -  Tired, decreased energy 1 1 - - -  Change in appetite 0 1 - - -  Feeling bad or failure about yourself  1 0 - - -  Trouble concentrating 0 0 - - -  Moving slowly or fidgety/restless 0 0 - - -  Suicidal thoughts 0 0 - - -  PHQ-9 Score 4 2 - - -  Difficult doing work/chores - - - - -     GAD 7 : Generalized Anxiety Score 02/07/2021 12/26/2020  Nervous, Anxious, on Edge 0 1  Control/stop worrying 1 1  Worry too much - different things 1 0  Trouble relaxing 1 0  Restless 0 0  Easily annoyed or irritable 2 1  Afraid - awful might happen 0 0  Total GAD 7 Score 5 3     Review of Systems:   Pertinent items are noted in HPI Denies cramping/contractions, leakage of fluid, vaginal bleeding, abnormal vaginal discharge w/ itching/odor/irritation, headaches, visual changes, shortness of breath, chest pain, abdominal pain, severe nausea/vomiting, or problems with urination or bowel movements unless otherwise stated above.  Pertinent History Reviewed:  Reviewed past medical,surgical, social,  obstetrical and family history.  Reviewed problem list, medications and allergies. OB History  Gravida Para Term Preterm AB Living  1 0 0 0 0 0  SAB IAB Ectopic Multiple Live Births  0 0 0 0 0    # Outcome Date GA Lbr Len/2nd Weight Sex Delivery Anes PTL Lv  1 Current            Physical Assessment:   Vitals:   02/07/21 1415  BP: 123/75  Pulse: 100  Weight: 174 lb (78.9 kg)  Body mass index is 27.25 kg/m.       Physical Examination:  General appearance - well appearing, and in no distress  Mental status - alert, oriented to person, place, and time  Psych:  She has a normal mood and affect  Skin - warm and dry, normal color, no suspicious lesions noted  Chest - effort normal, all lung fields clear to auscultation bilaterally  Heart - normal rate and regular rhythm  Abdomen - soft, nontender  Extremities:  No swelling or varicosities noted  Pelvic - VULVA: normal appearing vulva with no masses, tenderness or lesions  VAGINA: normal appearing vagina with normal color and discharge, no lesions  CERVIX: normal appearing cervix without discharge or lesions, no CMT  Thin prep pap is done w/ reflex HR HPV cotesting  Chaperone: Jerilynn Mages,  LPN    TODAY'S FHR via doppler: 160  Results for orders placed or performed in visit on 02/07/21 (from the past 24 hour(s))  POC Urinalysis Dipstick OB   Collection Time: 02/07/21  2:08 PM  Result Value Ref Range   Color, UA     Clarity, UA     Glucose, UA Negative Negative   Bilirubin, UA     Ketones, UA neg    Spec Grav, UA     Blood, UA neg    pH, UA     POC,PROTEIN,UA Negative Negative, Trace, Small (1+), Moderate (2+), Large (3+), 4+   Urobilinogen, UA     Nitrite, UA neg    Leukocytes, UA Negative Negative   Appearance     Odor      Assessment & Plan:  1) High-Risk Pregnancy G1P0000 at [redacted]w[redacted]d with an Estimated Date of Delivery: 08/10/21   2) Initial OB visit  3) T2DM Class C> dx @ 20yo, normal eye exam Aug 2021, currently on  metformin 1,000mg  BID and Lantus 10u qhs. Discussed sugars w/ DR. Ozan, increase Lantus to 12u qhs (if 2hr pp still towards 150s, can increase to 14u). Start ASA 81mg . Check A1C today  Meds:  Meds ordered this encounter  Medications  . Blood Pressure Monitor MISC    Sig: For regular home bp monitoring during pregnancy    Dispense:  1 each    Refill:  0    O09.90 Please mail to patient  . aspirin 81 MG EC tablet    Sig: Take 1 tablet (81 mg total) by mouth daily. Swallow whole.    Dispense:  90 tablet    Refill:  3    Order Specific Question:   Supervising Provider    Answer:   H [[id number]]    Initial labs obtained Continue prenatal vitamins Reviewed n/v relief measures and warning s/s to report Reviewed recommended weight gain based on pre-gravid BMI Encouraged well-balanced diet Genetic & carrier screening discussed: requests Panorama, AFP and Horizon , no u/s for NT/IT available  Ultrasound discussed; fetal survey: requested CCNC completed> form faxed if has or is planning to apply for medicaid The nature of [place] [facility] Center for [facility] with multiple MDs and other Advanced Practice Providers was explained to patient; also emphasized that fellows, residents, and students are part of our team. Does not have home bp cuff. Office bp cuff given: no. Rx sent: yes. Check bp weekly, let [employer] know if consistently >140/90.    Follow-up: Return in about 2 weeks (around 02/21/2021) for HROB, MD only, in person.   Orders Placed This Encounter  Procedures  . Urine Culture  . Genetic Screening  . Pain Management Screening Profile (10S)  . CBC/D/Plt+RPR+Rh+ABO+RubIgG...  . Hemoglobin A1c  . POC Urinalysis Dipstick OB    02/23/2021 CNM, [facility] 02/07/2021 3:03 PM

## 2021-02-08 ENCOUNTER — Encounter: Payer: Self-pay | Admitting: Women's Health

## 2021-02-08 DIAGNOSIS — F1290 Cannabis use, unspecified, uncomplicated: Secondary | ICD-10-CM | POA: Insufficient documentation

## 2021-02-08 LAB — CBC/D/PLT+RPR+RH+ABO+RUBIGG...
Antibody Screen: NEGATIVE
Basophils Absolute: 0.1 10*3/uL (ref 0.0–0.2)
Basos: 1 %
EOS (ABSOLUTE): 0.1 10*3/uL (ref 0.0–0.4)
Eos: 1 %
HCV Ab: <0.1 s/co ratio (ref 0.0–0.9)
HIV Screen 4th Generation wRfx: NONREACTIVE
Hematocrit: 33.5 % — ABNORMAL LOW (ref 34.0–46.6)
Hemoglobin: 10.4 g/dL — ABNORMAL LOW (ref 11.1–15.9)
Hepatitis B Surface Ag: NEGATIVE
Immature Grans (Abs): 0 10*3/uL (ref 0.0–0.1)
Immature Granulocytes: 0 %
Lymphocytes Absolute: 4.3 10*3/uL — ABNORMAL HIGH (ref 0.7–3.1)
Lymphs: 28 %
MCH: 24.5 pg — ABNORMAL LOW (ref 26.6–33.0)
MCHC: 31 g/dL — ABNORMAL LOW (ref 31.5–35.7)
MCV: 79 fL (ref 79–97)
Monocytes Absolute: 0.9 10*3/uL (ref 0.1–0.9)
Monocytes: 6 %
Neutrophils Absolute: 9.9 10*3/uL — ABNORMAL HIGH (ref 1.4–7.0)
Neutrophils: 64 %
Platelets: 326 10*3/uL (ref 150–450)
RBC: 4.24 x10E6/uL (ref 3.77–5.28)
RDW: 16.4 % — ABNORMAL HIGH (ref 11.7–15.4)
RPR Ser Ql: NONREACTIVE
Rh Factor: POSITIVE
Rubella Antibodies, IGG: 1.43 index (ref >0.99)
WBC: 15.4 10*3/uL — ABNORMAL HIGH (ref 3.4–10.8)

## 2021-02-08 LAB — PMP SCREEN PROFILE (10S), URINE
Amphetamine Scrn, Ur: NEGATIVE ng/mL
BARBITURATE SCREEN URINE: NEGATIVE ng/mL
BENZODIAZEPINE SCREEN, URINE: NEGATIVE ng/mL
CANNABINOIDS UR QL SCN: POSITIVE ng/mL — AB
Cocaine (Metab) Scrn, Ur: NEGATIVE ng/mL
Creatinine(Crt), U: 232.1 mg/dL (ref 20.0–300.0)
Methadone Screen, Urine: NEGATIVE ng/mL
OXYCODONE+OXYMORPHONE UR QL SCN: NEGATIVE ng/mL
Opiate Scrn, Ur: NEGATIVE ng/mL
Ph of Urine: 5.5 (ref 4.5–8.9)
Phencyclidine Qn, Ur: NEGATIVE ng/mL
Propoxyphene Scrn, Ur: NEGATIVE ng/mL

## 2021-02-08 LAB — HCV INTERPRETATION

## 2021-02-08 LAB — HEMOGLOBIN A1C
Est. average glucose Bld gHb Est-mCnc: 134 mg/dL
Hgb A1c MFr Bld: 6.3 % — ABNORMAL HIGH (ref 4.8–5.6)

## 2021-02-08 MED ORDER — FERROUS SULFATE 325 (65 FE) MG PO TABS: 325.0000 mg | ORAL_TABLET | ORAL | 2 refills | Status: DC

## 2021-02-08 NOTE — Addendum Note (Signed)
Addended by: Cheral Marker on: 02/08/2021 12:46 PM   Modules accepted: Orders

## 2021-02-09 ENCOUNTER — Telehealth: Payer: Self-pay

## 2021-02-09 NOTE — Telephone Encounter (Signed)
Called pt to relay urine drug screen results and unread Mychart msg per Selena Batten. Pt confirmed understanding.

## 2021-02-10 LAB — CYTOLOGY - PAP
Chlamydia: NEGATIVE
Comment: NEGATIVE
Comment: NORMAL
Neisseria Gonorrhea: NEGATIVE

## 2021-02-11 LAB — URINE CULTURE

## 2021-02-13 ENCOUNTER — Encounter: Payer: Self-pay | Admitting: Women's Health

## 2021-02-13 DIAGNOSIS — R87619 Unspecified abnormal cytological findings in specimens from cervix uteri: Secondary | ICD-10-CM | POA: Insufficient documentation

## 2021-02-14 ENCOUNTER — Telehealth: Payer: Self-pay

## 2021-02-14 NOTE — Telephone Encounter (Signed)
Called pt to inform her of test results per Joellyn Haff. Pt confirmed understanding.

## 2021-02-17 ENCOUNTER — Encounter: Payer: Self-pay | Admitting: Women's Health

## 2021-02-21 ENCOUNTER — Encounter: Payer: Self-pay | Admitting: Obstetrics & Gynecology

## 2021-02-21 ENCOUNTER — Other Ambulatory Visit: Payer: Self-pay

## 2021-02-21 ENCOUNTER — Encounter: Payer: Self-pay | Admitting: Women's Health

## 2021-02-21 ENCOUNTER — Ambulatory Visit (INDEPENDENT_AMBULATORY_CARE_PROVIDER_SITE_OTHER): Payer: Medicaid Other | Admitting: Obstetrics & Gynecology

## 2021-02-21 VITALS — BP 111/74 | HR 103 | Wt 175.8 lb

## 2021-02-21 DIAGNOSIS — Z794 Long term (current) use of insulin: Secondary | ICD-10-CM

## 2021-02-21 DIAGNOSIS — Z1379 Encounter for other screening for genetic and chromosomal anomalies: Secondary | ICD-10-CM

## 2021-02-21 DIAGNOSIS — Z3A15 15 weeks gestation of pregnancy: Secondary | ICD-10-CM

## 2021-02-21 DIAGNOSIS — E119 Type 2 diabetes mellitus without complications: Secondary | ICD-10-CM

## 2021-02-21 DIAGNOSIS — Z029 Encounter for administrative examinations, unspecified: Secondary | ICD-10-CM

## 2021-02-21 MED ORDER — INSULIN ASPART 100 UNIT/ML IJ SOLN: 2.0000 [IU] | Freq: Every day | INTRAMUSCULAR | 12 refills | Status: DC

## 2021-02-21 NOTE — Progress Notes (Signed)
HIGH-RISK PREGNANCY VISIT Patient name: Brittany Wade MRN [id number]  Date of birth: [birth year] Chief Complaint:   Routine Prenatal Visit  History of Present Illness:   Brittany Wade is a 20 y.o. G45P0000 female at [redacted]w[redacted]d with an Estimated Date of Delivery: 08/10/21 being seen today for ongoing management of a high-risk pregnancy complicated by diabetes mellitus A2DM currently on Lantus 12u qhs .    Sugar log reviewed- fastings: mostly 100s-116 Pt does not typically eat breakfast, occasional snack during the day.  Only meal dinner.  2hr PP dinner- 130-148  Today she reports no complaints.   Contractions: Not present. Vag. Bleeding: None.   . denies leaking of fluid.   Depression screen [facility] 2/9 02/07/2021 02/07/2021 12/26/2020 10/04/2020 09/05/2020  Decreased Interest 1 1 0 0 0  Down, Depressed, Hopeless 0 0 0 0 0  PHQ - 2 Score 1 1 0 0 0  Altered sleeping 1 1 0 - -  Tired, decreased energy 1 1 1  - -  Change in appetite 0 0 1 - -  Feeling bad or failure about yourself  1 1 0 - -  Trouble concentrating 0 0 0 - -  Moving slowly or fidgety/restless 0 0 0 - -  Suicidal thoughts 0 0 0 - -  PHQ-9 Score 4 4 2  - -  Difficult doing work/chores - - - - -     Current Outpatient Medications  Medication Instructions   Accu-Chek Softclix Lancets lancets Use as instructed to check blood sugar 4 times daily   albuterol (VENTOLIN HFA) 108 (90 Base) MCG/ACT inhaler USE 2 PUFFS EVERY 6 HOURS AS NEEDED   aspirin 81 mg, Oral, Daily, Swallow whole.   Blood Pressure Monitor MISC For regular home bp monitoring during pregnancy   ferrous sulfate 325 mg, Oral, Every other day   furosemide (LASIX) 20 mg, Oral, As needed   glucose blood (ACCU-CHEK GUIDE) test strip Use as instructed to check blood sugar 4 times daily   Insulin Pen Needle (PEN NEEDLES) 32G X 4 MM MISC Use to inject insulin daily   Lantus SoloStar 10 Units, Subcutaneous, Daily   metFORMIN (GLUCOPHAGE) 1,000 mg, Oral, 2 times daily with meals    Prenatal MV & Min w/FA-DHA (PRENATAL GUMMIES PO) Oral     Review of Systems:   Pertinent items are noted in HPI Denies abnormal vaginal discharge w/ itching/odor/irritation, headaches, visual changes, shortness of breath, chest pain, abdominal pain, severe nausea/vomiting, or problems with urination or bowel movements unless otherwise stated above. Pertinent History Reviewed:  Reviewed past medical,surgical, social, obstetrical and family history.  Reviewed problem list, medications and allergies. Physical Assessment:   Vitals:   02/21/21 1521  BP: 111/74  Pulse: (!) 103  Weight: 175 lb 12.8 oz (79.7 kg)  Body mass index is 27.53 kg/m.           Physical Examination:   General appearance: alert, well appearing, and in no distress  Mental status: alert, oriented to person, place, and time  Skin: warm & dry   Extremities: Edema: None    Cardiovascular: normal heart rate noted  Respiratory: normal respiratory effort, no distress  Abdomen: gravid, soft, non-tender  Pelvic: Cervical exam deferred         Fetal Status: Fetal Heart Rate (bpm): 160        Fetal Surveillance Testing today: doppler   Chaperone: Angel Neas    No results found for this or any previous visit (from the past  24 hour(s)).   Assessment & Plan:  High-risk pregnancy: G1P0000 at [redacted]w[redacted]d with an Estimated Date of Delivery: 08/10/21   1) T2DM -continue metformin -plan to increase lantus to 14u qhs -Also added novolog for meal coverage.  Start with 2u with dinner, may increase to 4u if still above 120 -f/u in 2wks  Meds: No orders of the defined types were placed in this encounter.   Labs/procedures today: AFP today  Treatment Plan:  Recheck sugars in 2 wks due to change in meds, discussed plan for anatomy scan and ECHO  Reviewed: Preterm labor symptoms and general obstetric precautions including but not limited to vaginal bleeding, contractions, leaking of fluid and fetal movement were reviewed in detail  with the patient.  All questions were answered.   Follow-up: Return in about 2 weeks (around 03/07/2021) for HROB and then 4-5wk anatomy scan.   No future appointments.  Orders Placed This Encounter  Procedures   AFP, Serum, Open Spina Bifida    Myna Hidalgo, DO Attending Obstetrician & Gynecologist, Faculty Practice Center for [employer], [facility]

## 2021-02-23 LAB — AFP, SERUM, OPEN SPINA BIFIDA
AFP MoM: 0.68
AFP Value: 19.9 ng/mL
Gest. Age on Collection Date: 15.5 weeks
Maternal Age At EDD: 22 yr
OSBR Risk 1 IN: 10000
Test Results:: NEGATIVE
Weight: 175 [lb_av]

## 2021-03-01 ENCOUNTER — Other Ambulatory Visit: Payer: Self-pay | Admitting: *Deleted

## 2021-03-01 DIAGNOSIS — E118 Type 2 diabetes mellitus with unspecified complications: Secondary | ICD-10-CM

## 2021-03-01 DIAGNOSIS — O0992 Supervision of high risk pregnancy, unspecified, second trimester: Secondary | ICD-10-CM

## 2021-03-22 ENCOUNTER — Other Ambulatory Visit: Payer: Self-pay | Admitting: Obstetrics & Gynecology

## 2021-03-22 DIAGNOSIS — Z363 Encounter for antenatal screening for malformations: Secondary | ICD-10-CM

## 2021-03-23 ENCOUNTER — Encounter: Payer: Medicaid Other | Admitting: Women's Health

## 2021-03-23 ENCOUNTER — Other Ambulatory Visit: Payer: Medicaid Other

## 2021-04-11 ENCOUNTER — Other Ambulatory Visit: Payer: Self-pay | Admitting: Women's Health

## 2021-04-11 ENCOUNTER — Telehealth: Payer: Self-pay | Admitting: Women's Health

## 2021-04-11 MED ORDER — PENICILLIN V POTASSIUM 500 MG PO TABS: 500.0000 mg | ORAL_TABLET | Freq: Four times a day (QID) | ORAL | 0 refills | Status: DC

## 2021-04-11 NOTE — Telephone Encounter (Signed)
Called pt to inform her of prescription sent to pharmacy. Pt confirmed understanding. 

## 2021-04-11 NOTE — Telephone Encounter (Signed)
Pt requesting antibiotics for tooth, states her dentist won't see her until she see's oral surgeon 1st & oral surgeon won't see her while she's pregnant   Please advise & notify pt    [facility]

## 2021-04-24 ENCOUNTER — Ambulatory Visit (INDEPENDENT_AMBULATORY_CARE_PROVIDER_SITE_OTHER): Payer: Medicaid Other | Admitting: Obstetrics & Gynecology

## 2021-04-24 ENCOUNTER — Ambulatory Visit (INDEPENDENT_AMBULATORY_CARE_PROVIDER_SITE_OTHER): Payer: Medicaid Other

## 2021-04-24 ENCOUNTER — Encounter: Payer: Self-pay | Admitting: Obstetrics & Gynecology

## 2021-04-24 ENCOUNTER — Other Ambulatory Visit: Payer: Self-pay

## 2021-04-24 VITALS — BP 107/63 | HR 84 | Wt 182.5 lb

## 2021-04-24 DIAGNOSIS — Z3A24 24 weeks gestation of pregnancy: Secondary | ICD-10-CM

## 2021-04-24 DIAGNOSIS — Z363 Encounter for antenatal screening for malformations: Secondary | ICD-10-CM

## 2021-04-24 DIAGNOSIS — O0992 Supervision of high risk pregnancy, unspecified, second trimester: Secondary | ICD-10-CM

## 2021-04-24 LAB — POCT URINALYSIS DIPSTICK OB
Blood, UA: NEGATIVE
Glucose, UA: NEGATIVE
Ketones, UA: NEGATIVE
Nitrite, UA: NEGATIVE
POC,PROTEIN,UA: NEGATIVE

## 2021-04-24 NOTE — Progress Notes (Signed)
HIGH-RISK PREGNANCY VISIT Patient name: Brittany Wade MRN [id number]  Date of birth: 03-24-2001 Chief Complaint:   High Risk Gestation ([place] today)  History of Present Illness:   Brittany Wade is a 20 y.o. G83P0000 female at [redacted]w[redacted]d with an Estimated Date of Delivery: 08/10/21 being seen today for ongoing management of a high-risk pregnancy complicated by Class C DM     Today she reports no complaints. Contractions: Not present. Vag. Bleeding: None.  Movement: Present. denies leaking of fluid.   Depression screen [facility] 2/9 02/07/2021 02/07/2021 12/26/2020 10/04/2020 09/05/2020  Decreased Interest 1 1 0 0 0  Down, Depressed, Hopeless 0 0 0 0 0  PHQ - 2 Score 1 1 0 0 0  Altered sleeping 1 1 0 - -  Tired, decreased energy 1 1 1  - -  Change in appetite 0 0 1 - -  Feeling bad or failure about yourself  1 1 0 - -  Trouble concentrating 0 0 0 - -  Moving slowly or fidgety/restless 0 0 0 - -  Suicidal thoughts 0 0 0 - -  PHQ-9 Score 4 4 2  - -  Difficult doing work/chores - - - - -     GAD 7 : Generalized Anxiety Score 02/07/2021 02/07/2021 12/26/2020  Nervous, Anxious, on Edge 0 0 1  Control/stop worrying 1 1 1   Worry too much - different things 1 1 0  Trouble relaxing 1 1 0  Restless 0 0 0  Easily annoyed or irritable 2 2 1   Afraid - awful might happen 0 0 0  Total GAD 7 Score 5 5 3      Review of Systems:   Pertinent items are noted in HPI Denies abnormal vaginal discharge w/ itching/odor/irritation, headaches, visual changes, shortness of breath, chest pain, abdominal pain, severe nausea/vomiting, or problems with urination or bowel movements unless otherwise stated above. Pertinent History Reviewed:  Reviewed past medical,surgical, social, obstetrical and family history.  Reviewed problem list, medications and allergies. Physical Assessment:   Vitals:   04/24/21 1543  BP: 107/63  Pulse: 84  Weight: 182 lb 8 oz (82.8 kg)  Body mass index is 28.58 kg/m.           Physical Examination:    General appearance: alert, well appearing, and in no distress  Mental status: alert, oriented to person, place, and time  Skin: warm & dry   Extremities: Edema: None    Cardiovascular: normal heart rate noted  Respiratory: normal respiratory effort, no distress  Abdomen: gravid, soft, non-tender  Pelvic: Cervical exam deferred         Fetal Status:     Movement: Present    Fetal Surveillance Testing today: sonogram    Chaperone: N/A    Results for orders placed or performed in visit on 04/24/21 (from the past 24 hour(s))  POC Urinalysis Dipstick OB   Collection Time: 04/24/21  3:38 PM  Result Value Ref Range   Color, UA     Clarity, UA     Glucose, UA Negative Negative   Bilirubin, UA     Ketones, UA neg    Spec Grav, UA     Blood, UA neg    pH, UA     POC,PROTEIN,UA Negative Negative, Trace, Small (1+), Moderate (2+), Large (3+), 4+   Urobilinogen, UA     Nitrite, UA neg    Leukocytes, UA Trace (A) Negative   Appearance     Odor  Assessment & Plan:  High-risk pregnancy: G1P0000 at [redacted]w[redacted]d with an Estimated Date of Delivery: 08/10/21   1) Class C DM, stable, increase to 18 units Lantus, hold novolog, continue metrformin 1 gram BID    Meds: No orders of the defined types were placed in this encounter.   Labs/procedures today: U/S  Treatment Plan:  PN2 without glucola  Reviewed: Preterm labor symptoms and general obstetric precautions including but not limited to vaginal bleeding, contractions, leaking of fluid and fetal movement were reviewed in detail with the patient.  All questions were answered. Does not have home bp cuff. Office bp cuff given: not applicable. Check bp weekly, let us know if consistently >140 and/or >90.  Follow-up: No follow-ups on file.   No future appointments.  Orders Placed This Encounter  Procedures   POC Urinalysis Dipstick OB   Amaryllis Dyke Henrietta Cieslewicz 04/24/2021 4:20 PM

## 2021-04-24 NOTE — Progress Notes (Signed)
[place] 24+4 wks,breech,cx 3.3 cm,SVP of fluid 5.2 cm,posterior placenta gr 0,normal ovaries,fhr 164 bpm,EFW 831 g 84%,anatomy complete,no obvious abnormalities

## 2021-04-25 ENCOUNTER — Encounter: Payer: Self-pay | Admitting: *Deleted

## 2021-05-06 ENCOUNTER — Other Ambulatory Visit: Payer: Self-pay | Admitting: Family Medicine

## 2021-05-06 DIAGNOSIS — E118 Type 2 diabetes mellitus with unspecified complications: Secondary | ICD-10-CM

## 2021-05-11 ENCOUNTER — Other Ambulatory Visit: Payer: Self-pay | Admitting: Women's Health

## 2021-05-15 ENCOUNTER — Other Ambulatory Visit: Payer: Self-pay

## 2021-05-16 ENCOUNTER — Other Ambulatory Visit: Payer: Self-pay | Admitting: Women's Health

## 2021-05-16 MED ORDER — PENICILLIN V POTASSIUM 500 MG PO TABS: 500.0000 mg | ORAL_TABLET | Freq: Four times a day (QID) | ORAL | 0 refills | Status: DC

## 2021-05-19 NOTE — Addendum Note (Signed)
Addended by: Moss Mc on: 05/19/2021 01:53 PM   Modules accepted: Orders

## 2021-05-22 ENCOUNTER — Other Ambulatory Visit: Payer: Medicaid Other

## 2021-05-22 ENCOUNTER — Ambulatory Visit (INDEPENDENT_AMBULATORY_CARE_PROVIDER_SITE_OTHER): Payer: Medicaid Other | Admitting: Obstetrics & Gynecology

## 2021-05-22 ENCOUNTER — Other Ambulatory Visit: Payer: Self-pay

## 2021-05-22 ENCOUNTER — Other Ambulatory Visit: Payer: Self-pay | Admitting: Obstetrics & Gynecology

## 2021-05-22 ENCOUNTER — Encounter: Payer: Self-pay | Admitting: Obstetrics & Gynecology

## 2021-05-22 VITALS — BP 131/78 | HR 105 | Wt 187.0 lb

## 2021-05-22 DIAGNOSIS — O0993 Supervision of high risk pregnancy, unspecified, third trimester: Secondary | ICD-10-CM

## 2021-05-22 DIAGNOSIS — Z3A28 28 weeks gestation of pregnancy: Secondary | ICD-10-CM

## 2021-05-22 DIAGNOSIS — O24319 Unspecified pre-existing diabetes mellitus in pregnancy, unspecified trimester: Secondary | ICD-10-CM

## 2021-05-22 LAB — POCT URINALYSIS DIPSTICK OB
Nitrite, UA: NEGATIVE
POC,PROTEIN,UA: NEGATIVE

## 2021-05-22 MED ORDER — LANTUS SOLOSTAR 100 UNIT/ML ~~LOC~~ SOPN: 18.0000 [IU] | PEN_INJECTOR | Freq: Every day | SUBCUTANEOUS | 11 refills | Status: DC

## 2021-05-22 MED ORDER — INSULIN ASPART 100 UNIT/ML IJ SOLN: 2.0000 [IU] | Freq: Every day | INTRAMUSCULAR | 0 refills | Status: DC

## 2021-05-22 NOTE — Progress Notes (Signed)
HIGH-RISK PREGNANCY VISIT Patient name: Brittany Wade MRN [id number]  Date of birth: December 29, 2000 Chief Complaint:   High Risk Gestation (PN2 today)  History of Present Illness:   Brittany Wade is a 20 y.o. G20P0000 female at [redacted]w[redacted]d with an Estimated Date of Delivery: 08/10/21 being seen today for ongoing management of a high-risk pregnancy complicated by Class C DM.    Today she reports no complaints. Contractions: Not present. Vag. Bleeding: None.  Movement: Present. denies leaking of fluid.   Depression screen [facility] 2/9 05/22/2021 02/07/2021 02/07/2021 12/26/2020 10/04/2020  Decreased Interest 1 1 1  0 0  Down, Depressed, Hopeless 0 0 0 0 0  PHQ - 2 Score 1 1 1  0 0  Altered sleeping 1 1 1  0 -  Tired, decreased energy 0 1 1 1  -  Change in appetite 0 0 0 1 -  Feeling bad or failure about yourself  0 1 1 0 -  Trouble concentrating 0 0 0 0 -  Moving slowly or fidgety/restless 0 0 0 0 -  Suicidal thoughts 0 0 0 0 -  PHQ-9 Score 2 4 4 2  -  Difficult doing work/chores - - - - -     GAD 7 : Generalized Anxiety Score 05/22/2021 02/07/2021 02/07/2021 12/26/2020  Nervous, Anxious, on Edge 1 0 0 1  Control/stop worrying 0 1 1 1   Worry too much - different things 0 1 1 0  Trouble relaxing 1 1 1  0  Restless 0 0 0 0  Easily annoyed or irritable 0 2 2 1   Afraid - awful might happen 0 0 0 0  Total GAD 7 Score 2 5 5 3      Review of Systems:   Pertinent items are noted in HPI Denies abnormal vaginal discharge w/ itching/odor/irritation, headaches, visual changes, shortness of breath, chest pain, abdominal pain, severe nausea/vomiting, or problems with urination or bowel movements unless otherwise stated above. Pertinent History Reviewed:  Reviewed past medical,surgical, social, obstetrical and family history.  Reviewed problem list, medications and allergies. Physical Assessment:   Vitals:   05/22/21 1359  BP: 131/78  Pulse: (!) 105  Weight: 187 lb (84.8 kg)  Body mass index is 29.29 kg/m.            Physical Examination:   General appearance: alert, well appearing, and in no distress  Mental status: alert, oriented to person, place, and time  Skin: warm & dry   Extremities: Edema: Trace    Cardiovascular: normal heart rate noted  Respiratory: normal respiratory effort, no distress  Abdomen: gravid, soft, non-tender  Pelvic: Cervical exam deferred         Fetal Status:     Movement: Present    Fetal Surveillance Testing today:    Chaperone: N/A    Results for orders placed or performed in visit on 05/22/21 (from the past 24 hour(s))  POC Urinalysis Dipstick OB   Collection Time: 05/22/21  2:02 PM  Result Value Ref Range   Color, UA     Clarity, UA     Glucose, UA Moderate (2+) (A) Negative   Bilirubin, UA     Ketones, UA 1+    Spec Grav, UA     Blood, UA trace    pH, UA     POC,PROTEIN,UA Negative Negative, Trace, Small (1+), Moderate (2+), Large (3+), 4+   Urobilinogen, UA     Nitrite, UA neg    Leukocytes, UA Moderate (2+) (A) Negative   Appearance  Odor      Assessment & Plan:  High-risk pregnancy: G1P0000 at [redacted]w[redacted]d with an Estimated Date of Delivery: 08/10/21   1) Class C DM, lantus 18 units, metformin 1 g bid, ADD 2 NOVOLOG AT DINNER    Meds:  Meds ordered this encounter  Medications   insulin aspart (NOVOLOG) 100 UNIT/ML injection    Sig: Inject 2 Units into the skin daily with supper.    Dispense:  10 mL    Refill:  0    Labs/procedures today: none  Treatment Plan:  protocol Class C DM  Reviewed: Preterm labor symptoms and general obstetric precautions including but not limited to vaginal bleeding, contractions, leaking of fluid and fetal movement were reviewed in detail with the patient.  All questions were answered. Does have home bp cuff. Office bp cuff given: not applicable. Check bp weekly, let us know if consistently >140 and/or >90.  Follow-up: Return in about 2 weeks (around 06/05/2021) for HROB.   No future appointments.  Orders  Placed This Encounter  Procedures   POC Urinalysis Dipstick OB   Brittany Wade  05/22/2021 2:21 PM

## 2021-05-23 ENCOUNTER — Other Ambulatory Visit: Payer: Self-pay | Admitting: Women's Health

## 2021-05-23 LAB — CBC
Hematocrit: 27.3 % — ABNORMAL LOW (ref 34.0–46.6)
Hemoglobin: 8.2 g/dL — ABNORMAL LOW (ref 11.1–15.9)
MCH: 24 pg — ABNORMAL LOW (ref 26.6–33.0)
MCHC: 30 g/dL — ABNORMAL LOW (ref 31.5–35.7)
MCV: 80 fL (ref 79–97)
Platelets: 349 10*3/uL (ref 150–450)
RBC: 3.41 x10E6/uL — ABNORMAL LOW (ref 3.77–5.28)
RDW: 16.9 % — ABNORMAL HIGH (ref 11.7–15.4)
WBC: 19.9 10*3/uL — ABNORMAL HIGH (ref 3.4–10.8)

## 2021-05-23 LAB — HIV ANTIBODY (ROUTINE TESTING W REFLEX): HIV Screen 4th Generation wRfx: NONREACTIVE

## 2021-05-23 LAB — RPR: RPR Ser Ql: NONREACTIVE

## 2021-05-23 LAB — ANTIBODY SCREEN: Antibody Screen: NEGATIVE

## 2021-05-23 MED ORDER — FERROUS SULFATE 300 MG/6.8ML PO SOLN: 325.0000 mg | ORAL | 6 refills | Status: DC

## 2021-05-24 ENCOUNTER — Telehealth: Payer: Self-pay

## 2021-05-24 NOTE — Telephone Encounter (Signed)
Called pt to relay msg per Joellyn Haff, no answer, vm is full. Sent pt a Radio broadcast assistant. Looks like pt saw results, but not the comments from [place]. Put those in the Mychart msg.

## 2021-05-25 ENCOUNTER — Other Ambulatory Visit: Payer: Self-pay

## 2021-05-31 ENCOUNTER — Telehealth: Payer: Self-pay

## 2021-05-31 NOTE — Telephone Encounter (Signed)
Called both numbers, no answer, not in service, no availability to leave vm

## 2021-06-05 ENCOUNTER — Ambulatory Visit (INDEPENDENT_AMBULATORY_CARE_PROVIDER_SITE_OTHER): Payer: Medicaid Other | Admitting: Obstetrics & Gynecology

## 2021-06-05 ENCOUNTER — Other Ambulatory Visit: Payer: Self-pay

## 2021-06-05 ENCOUNTER — Encounter: Payer: Self-pay | Admitting: Obstetrics & Gynecology

## 2021-06-05 VITALS — BP 123/78 | HR 131 | Wt 192.0 lb

## 2021-06-05 DIAGNOSIS — Z3A30 30 weeks gestation of pregnancy: Secondary | ICD-10-CM

## 2021-06-05 DIAGNOSIS — O0993 Supervision of high risk pregnancy, unspecified, third trimester: Secondary | ICD-10-CM

## 2021-06-05 LAB — POCT URINALYSIS DIPSTICK OB
Glucose, UA: NEGATIVE
Ketones, UA: NEGATIVE
Nitrite, UA: NEGATIVE
POC,PROTEIN,UA: NEGATIVE

## 2021-06-05 NOTE — Progress Notes (Signed)
HIGH-RISK PREGNANCY VISIT Patient name: Brittany Wade MRN [id number]  Date of birth: [birth year] Chief Complaint:   High Risk Gestation  History of Present Illness:   Brittany Wade is a 20 y.o. G90P0000 female at [redacted]w[redacted]d with an Estimated Date of Delivery: 08/10/21 being seen today for ongoing management of a high-risk pregnancy complicated by class C DM well controlled.    Today she reports no complaints. Contractions: Not present. Vag. Bleeding: None.  Movement: Present. denies leaking of fluid.   Depression screen [facility] [place] [facility] 2/9 05/22/2021 02/07/2021 02/07/2021 12/26/2020 10/04/2020  Decreased Interest 1 1 1  0 0  Down, Depressed, Hopeless 0 0 0 0 0  PHQ - 2 Score 1 1 1  0 0  Altered sleeping 1 1 1  0 -  Tired, decreased energy 0 1 1 1  -  Change in appetite 0 0 0 1 -  Feeling bad or failure about yourself  0 1 1 0 -  Trouble concentrating 0 0 0 0 -  Moving slowly or fidgety/restless 0 0 0 0 -  Suicidal thoughts 0 0 0 0 -  PHQ-9 Score 2 4 4 2  -  Difficult doing work/chores - - - - -     GAD 7 : Generalized Anxiety Score 05/22/2021 02/07/2021 02/07/2021 12/26/2020  Nervous, Anxious, on Edge 1 0 0 1  Control/stop worrying 0 1 1 1   Worry too much - different things 0 1 1 0  Trouble relaxing 1 1 1  0  Restless 0 0 0 0  Easily annoyed or irritable 0 2 2 1   Afraid - awful might happen 0 0 0 0  Total GAD 7 Score 2 5 5 3      Review of Systems:   Pertinent items are noted in HPI Denies abnormal vaginal discharge w/ itching/odor/irritation, headaches, visual changes, shortness of breath, chest pain, abdominal pain, severe nausea/vomiting, or problems with urination or bowel movements unless otherwise stated above. Pertinent History Reviewed:  Reviewed past medical,surgical, social, obstetrical and family history.  Reviewed problem list, medications and allergies. Physical Assessment:   Vitals:   06/05/21 1419  BP: 123/78  Pulse: (!) 131  Weight: 192 lb (87.1 kg)  Body mass index is 30.07 kg/m.            Physical Examination:   General appearance: alert, well appearing, and in no distress  Mental status: alert, oriented to person, place, and time  Skin: warm & dry   Extremities: Edema: Trace    Cardiovascular: normal heart rate noted  Respiratory: normal respiratory effort, no distress  Abdomen: gravid, soft, non-tender  Pelvic: Cervical exam deferred         Fetal Status: Fetal Heart Rate (bpm): 154 Fundal Height: 30 cm Movement: Present    Fetal Surveillance Testing today: FHR 154   Chaperone: N/A    Results for orders placed or performed in visit on 06/05/21 (from the past 24 hour(s))  POC Urinalysis Dipstick OB   Collection Time: 06/05/21  2:20 PM  Result Value Ref Range   Color, UA     Clarity, UA     Glucose, UA Negative Negative   Bilirubin, UA     Ketones, UA neg    Spec Grav, UA     Blood, UA 1+    pH, UA     POC,PROTEIN,UA Negative Negative, Trace, Small (1+), Moderate (2+), Large (3+), 4+   Urobilinogen, UA     Nitrite, UA neg    Leukocytes, UA Trace (A) Negative  Appearance     Odor      Assessment & Plan:  High-risk pregnancy: G1P0000 at [redacted]w[redacted]d with an Estimated Date of Delivery: 08/10/21   1) Class C DM, metformin 1000 mg BID, lantus 18ish @hs , novolog 2ish at supper(pt makes on the fly adjustments if needed), CBG are good  Meds: No orders of the defined types were placed in this encounter.   Labs/procedures today:   Treatment Plan:  begin twice weekly testing 2 weeks, EFW with next sono, sono alternating with NSTs  Reviewed: Preterm labor symptoms and general obstetric precautions including but not limited to vaginal bleeding, contractions, leaking of fluid and fetal movement were reviewed in detail with the patient.  All questions were answered. Does have home bp cuff. Office bp cuff given: not applicable. Check bp weekly, let know if consistently >140 and/or >90.  Follow-up: Return in about 2 weeks (around 06/19/2021) for 06/21/2021 BPP/follow up,  then schedule weekly BPP and weekly NST, sono Monday, NST thursdays, appt mondays.   No future appointments.  Orders Placed This Encounter  Procedures   POC Urinalysis Dipstick OB   01-26-1985  06/05/2021 2:39 PM

## 2021-06-16 ENCOUNTER — Other Ambulatory Visit: Payer: Self-pay | Admitting: Obstetrics & Gynecology

## 2021-06-16 DIAGNOSIS — O24113 Pre-existing diabetes mellitus, type 2, in pregnancy, third trimester: Secondary | ICD-10-CM

## 2021-06-19 ENCOUNTER — Ambulatory Visit (INDEPENDENT_AMBULATORY_CARE_PROVIDER_SITE_OTHER): Payer: Medicaid Other | Admitting: Women's Health

## 2021-06-19 ENCOUNTER — Ambulatory Visit (INDEPENDENT_AMBULATORY_CARE_PROVIDER_SITE_OTHER): Payer: Medicaid Other

## 2021-06-19 ENCOUNTER — Encounter: Payer: Self-pay | Admitting: Women's Health

## 2021-06-19 ENCOUNTER — Other Ambulatory Visit: Payer: Self-pay

## 2021-06-19 ENCOUNTER — Other Ambulatory Visit: Payer: Medicaid Other

## 2021-06-19 ENCOUNTER — Encounter: Payer: Medicaid Other | Admitting: Obstetrics & Gynecology

## 2021-06-19 VITALS — BP 123/69 | HR 138 | Wt 198.0 lb

## 2021-06-19 DIAGNOSIS — O24113 Pre-existing diabetes mellitus, type 2, in pregnancy, third trimester: Secondary | ICD-10-CM | POA: Diagnosis not present

## 2021-06-19 DIAGNOSIS — Z3A32 32 weeks gestation of pregnancy: Secondary | ICD-10-CM

## 2021-06-19 DIAGNOSIS — E118 Type 2 diabetes mellitus with unspecified complications: Secondary | ICD-10-CM

## 2021-06-19 DIAGNOSIS — Z23 Encounter for immunization: Secondary | ICD-10-CM

## 2021-06-19 DIAGNOSIS — O0993 Supervision of high risk pregnancy, unspecified, third trimester: Secondary | ICD-10-CM

## 2021-06-19 LAB — POCT URINALYSIS DIPSTICK OB
Blood, UA: NEGATIVE
Glucose, UA: NEGATIVE
Ketones, UA: NEGATIVE
Leukocytes, UA: NEGATIVE
Nitrite, UA: NEGATIVE

## 2021-06-19 NOTE — Progress Notes (Signed)
HIGH-RISK PREGNANCY VISIT Patient name: Brittany Wade MRN [id number]  Date of birth: Sep 02, 2001 Chief Complaint:   Routine Prenatal Visit, High Risk Gestation, and Pregnancy Ultrasound  History of Present Illness:   Brittany Wade is a 20 y.o. G86P0000 female at [redacted]w[redacted]d with an Estimated Date of Delivery: 08/10/21 being seen today for ongoing management of a high-risk pregnancy complicated by diabetes mellitus T2DM: Class C, currently on metformin 1,000mg  BID, Lantus 18u qhs, Novolog 2u w/ supper .    Today she reports FBS 67-106 (only 2>95, 96 & 106), 2hr pp 80-123 (only 4>120, 121-123), skips eating breakfast and lunch a lot. Hasn't had fetal echo, states she hasn't heard anything about it. Is trying to eat correctly. Taking atorvastatin for high cholesterol, rx'd by PCP. Contractions: Not present. Vag. Bleeding: None.  Movement: Present. denies leaking of fluid.   Depression screen [facility] 2/9 05/22/2021 02/07/2021 02/07/2021 12/26/2020 10/04/2020  Decreased Interest 1 1 1  0 0  Down, Depressed, Hopeless 0 0 0 0 0  PHQ - 2 Score 1 1 1  0 0  Altered sleeping 1 1 1  0 -  Tired, decreased energy 0 1 1 1  -  Change in appetite 0 0 0 1 -  Feeling bad or failure about yourself  0 1 1 0 -  Trouble concentrating 0 0 0 0 -  Moving slowly or fidgety/restless 0 0 0 0 -  Suicidal thoughts 0 0 0 0 -  PHQ-9 Score 2 4 4 2  -  Difficult doing work/chores - - - - -     GAD 7 : Generalized Anxiety Score 05/22/2021 02/07/2021 02/07/2021 12/26/2020  Nervous, Anxious, on Edge 1 0 0 1  Control/stop worrying 0 1 1 1   Worry too much - different things 0 1 1 0  Trouble relaxing 1 1 1  0  Restless 0 0 0 0  Easily annoyed or irritable 0 2 2 1   Afraid - awful might happen 0 0 0 0  Total GAD 7 Score 2 5 5 3      Review of Systems:   Pertinent items are noted in HPI Denies abnormal vaginal discharge w/ itching/odor/irritation, headaches, visual changes, shortness of breath, chest pain, abdominal pain, severe nausea/vomiting, or  problems with urination or bowel movements unless otherwise stated above. Pertinent History Reviewed:  Reviewed past medical,surgical, social, obstetrical and family history.  Reviewed problem list, medications and allergies. Physical Assessment:   Vitals:   06/19/21 0913  BP: 123/69  Pulse: (!) 138  Weight: 198 lb (89.8 kg)  Body mass index is 31.01 kg/m.           Physical Examination:   General appearance: alert, well appearing, and in no distress  Mental status: alert, oriented to person, place, and time  Skin: warm & dry   Extremities: Edema: Trace    Cardiovascular: normal heart rate noted  Respiratory: normal respiratory effort, no distress  Abdomen: gravid, soft, non-tender  Pelvic: Cervical exam deferred         Fetal Status: Fetal Heart Rate (bpm): 148 u/s   Movement: Present    Fetal Surveillance Testing today: 05/24/2021 32+4 wks,cephalic,fhr 148 bpm,AFI 22 cm,BPP 8/8,EFW 2755 g 99%,posterior placenta gr 0  Chaperone: N/A    Results for orders placed or performed in visit on 06/19/21 (from the past 24 hour(s))  POC Urinalysis Dipstick OB   Collection Time: 06/19/21  9:20 AM  Result Value Ref Range   Color, UA     Clarity, UA  Glucose, UA Negative Negative   Bilirubin, UA     Ketones, UA neg    Spec Grav, UA     Blood, UA neg    pH, UA     POC,PROTEIN,UA Trace Negative, Trace, Small (1+), Moderate (2+), Large (3+), 4+   Urobilinogen, UA     Nitrite, UA neg    Leukocytes, UA Negative Negative   Appearance     Odor      Assessment & Plan:  High-risk pregnancy: G1P0000 at [redacted]w[redacted]d with an Estimated Date of Delivery: 08/10/21   1) Class C T2 IDDM, stable, on metformin 1,000mg  BID, Lantus 18u qhs, Novolog 2u w/ supper. EFW 99% today, AFI high normal 22cm. Hasn't had fetal echo. Order faxed today, to let us know if she doesn't hear anything in the next week.   2) High cholesterol, stop atorvastatin, resume postpartum  Meds: No orders of the defined types were  placed in this encounter.   Labs/procedures today: tdap and U/S, declined flu  Treatment Plan:  Growth u/s q4wks       2x/wk testing or weekly BPP @ 32wks    Deliver @ 39wks:_____   Reviewed: Preterm labor symptoms and general obstetric precautions including but not limited to vaginal bleeding, contractions, leaking of fluid and fetal movement were reviewed in detail with the patient.  All questions were answered. Does have home bp cuff. Office bp cuff given: not applicable. Check bp weekly, let us know if consistently >140 and/or >90.  Follow-up: Return for change all upcoming HROB appts to MD only please (insulin dependent DM).   Future Appointments  Date Time Provider Department Center  06/22/2021  9:10 AM [facility] NURSE [facility]  06/26/2021 10:15 AM [facility] [place] [facility] None  06/26/2021 11:10 AM Myna Hidalgo, DO [facility]  06/29/2021  9:50 AM [facility] NURSE [facility]  07/03/2021  3:45 PM [facility] [place] [facility] None  07/03/2021  4:30 PM Lazaro Arms, MD [facility]  07/06/2021 11:10 AM [facility] NURSE [facility]  07/10/2021  2:45 PM [facility] [place] [facility] None  07/10/2021  3:30 PM Cheral Marker, CNM [facility]  07/13/2021  9:50 AM [facility] NURSE [facility]  07/17/2021  1:30 PM [facility] [place] [facility] None  07/17/2021  2:30 PM Cheral Marker, CNM [facility]  07/20/2021  9:30 AM [facility] NURSE [facility]  07/24/2021  1:30 PM [facility] [place] [facility] None  07/24/2021  2:30 PM Cheral Marker, CNM [facility]  08/01/2021  1:30 PM [facility] [place] [facility] None  08/01/2021  2:30 PM Cheral Marker, CNM [facility]  08/03/2021  9:30 AM [facility] NURSE [facility]    Orders Placed This Encounter  Procedures   Tdap vaccine greater than or equal to 7yo IM   POC Urinalysis Dipstick OB   Cheral Marker CNM, [facility] 06/19/2021 9:54 AM

## 2021-06-19 NOTE — Patient Instructions (Signed)
Brittany Wade, thank you for choosing our office today! We appreciate the opportunity to meet your healthcare needs. You may receive a short survey by mail, e-mail, or through [employer]. If you are happy with your care we would appreciate if you could take just a few minutes to complete the survey questions. We read all of your comments and take your feedback very seriously. Thank you again for choosing our office.  Center for [employer] Team at [facility]  [facility] at [facility] ([address] [place], [place] [address]) Entrance C, located off of [address] Free 24/7 valet parking   CLASSES: Go to [email or web address] to register for classes (childbirth, breastfeeding, waterbirth, infant CPR, daddy bootcamp, etc.)  Call the office [phone]) or go to [facility] if: You begin to have strong, frequent contractions Your water breaks.  Sometimes it is a big gush of fluid, sometimes it is just a trickle that keeps getting your panties wet or running down your legs You have vaginal bleeding.  It is normal to have a small amount of spotting if your cervix was checked.  You don't feel your baby moving like normal.  If you don't, get you something to eat and drink and lay down and focus on feeling your baby move.   If your baby is still not moving like normal, you should call the office or go to [facility].  Call the office [phone]) or go to [facility] hospital for these signs of pre-eclampsia: Severe headache that does not go away with Tylenol Visual changes- seeing spots, double, blurred vision Pain under your right breast or upper abdomen that does not go away with Tums or heartburn medicine Nausea and/or vomiting Severe swelling in your hands, feet, and face   Tdap Vaccine It is recommended that you get the Tdap vaccine during the third trimester of EACH pregnancy to help protect your baby from getting pertussis (whooping cough) 27-36 weeks is the BEST time to do  this so that you can pass the protection on to your baby. During pregnancy is better than after pregnancy, but if you are unable to get it during pregnancy it will be offered at the hospital.  You can get this vaccine with [place], at the health department, your family doctor, or some local pharmacies Everyone who will be around your baby should also be up-to-date on their vaccines before the baby comes. Adults (who are not pregnant) only need 1 dose of Tdap during adulthood.   [facility] Pediatricians/Family Doctors [facility]): [address] Colette Ribas, [phone]           [facility]: [address], [phone]                [facility] Medicine [facility]): [address], [phone] (call to ask if accepting patients) [facility] Department: [address], [place], [phone]    [facility] Pediatricians/Family Doctors [facility]): [phone], [phone] [facility]: [address] [place], [phone] [facility]: [address], [phone]  [facility] Doctors  [facility]): [phone] [facility]: [address], [phone]   [facility] Doctors [facility]: [address], [phone]  [facility] Family Doctors  [facility]: [phone], [phone]  Home Blood Pressure Monitoring for Patients   Your provider has recommended that you check your  blood pressure (BP) at least once a week at home. If you do not have a blood pressure cuff at home, one will be provided for you. Contact your provider if you have not received your monitor within 1 week.   Helpful Tips for Accurate Home Blood Pressure Checks  Don't smoke, exercise, or drink caffeine 30 minutes before checking your BP Use the restroom before checking your BP (a full bladder can raise your  pressure) Relax in a comfortable upright chair Feet on the ground Left arm resting comfortably on a flat surface at the level of your heart Legs uncrossed Back supported Sit quietly and don't talk Place the cuff on your bare arm Adjust snuggly, so that only two fingertips can fit between your skin and the top of the cuff Check 2 readings separated by at least one minute Keep a log of your BP readings For a visual, please reference this diagram: http://ccnc.care/bpdiagram  Provider Name: [facility]     Phone: [phone]  Zone 1: ALL CLEAR  Continue to monitor your symptoms:  BP reading is less than 140 (top number) or less than 90 (bottom number)  No right upper stomach pain No headaches or seeing spots No feeling nauseated or throwing up No swelling in face and hands  Zone 2: CAUTION Call your doctor's office for any of the following:  BP reading is greater than 140 (top number) or greater than 90 (bottom number)  Stomach pain under your ribs in the middle or right side Headaches or seeing spots Feeling nauseated or throwing up Swelling in face and hands  Zone 3: EMERGENCY  Seek immediate medical care if you have any of the following:  BP reading is greater than160 (top number) or greater than 110 (bottom number) Severe headaches not improving with Tylenol Serious difficulty catching your breath Any worsening symptoms from Zone 2   Third Trimester of Pregnancy The third trimester is from week 29 through week 42, months 7 through 9. The third trimester is a time when the fetus is growing rapidly. At the end of the ninth month, the fetus is about 20 inches in length and weighs 6-10 pounds.  BODY CHANGES Your body goes through many changes during pregnancy. The changes vary from woman to woman.  Your weight will continue to increase. You can expect to gain 25-35 pounds (11-16 kg) by the end of the pregnancy. You may begin to get stretch marks on your hips, abdomen,  and breasts. You may urinate more often because the fetus is moving lower into your pelvis and pressing on your bladder. You may develop or continue to have heartburn as a result of your pregnancy. You may develop constipation because certain hormones are causing the muscles that push waste through your intestines to slow down. You may develop hemorrhoids or swollen, bulging veins (varicose veins). You may have pelvic pain because of the weight gain and pregnancy hormones relaxing your joints between the bones in your pelvis. Backaches may result from overexertion of the muscles supporting your posture. You may have changes in your hair. These can include thickening of your hair, rapid growth, and changes in texture. Some women also have hair loss during or after pregnancy, or hair that feels dry or thin. Your hair will most likely return to normal after your baby is born. Your breasts will continue to grow and be tender. A yellow discharge may leak from your breasts called colostrum. Your belly button may stick out. You may   feel short of breath because of your expanding uterus. You may notice the fetus "dropping," or moving lower in your abdomen. You may have a bloody mucus discharge. This usually occurs a few days to a week before labor begins. Your cervix becomes thin and soft (effaced) near your due date. WHAT TO EXPECT AT YOUR PRENATAL EXAMS  You will have prenatal exams every 2 weeks until week 36. Then, you will have weekly prenatal exams. During a routine prenatal visit: You will be weighed to make sure you and the fetus are growing normally. Your blood pressure is taken. Your abdomen will be measured to track your baby's growth. The fetal heartbeat will be listened to. Any test results from the previous visit will be discussed. You may have a cervical check near your due date to see if you have effaced. At around 36 weeks, your caregiver will check your cervix. At the same time, your  caregiver will also perform a test on the secretions of the vaginal tissue. This test is to determine if a type of bacteria, Group B streptococcus, is present. Your caregiver will explain this further. Your caregiver may ask you: What your birth plan is. How you are feeling. If you are feeling the baby move. If you have had any abnormal symptoms, such as leaking fluid, bleeding, severe headaches, or abdominal cramping. If you have any questions. Other tests or screenings that may be performed during your third trimester include: Blood tests that check for low iron levels (anemia). Fetal testing to check the health, activity level, and growth of the fetus. Testing is done if you have certain medical conditions or if there are problems during the pregnancy. FALSE LABOR You may feel small, irregular contractions that eventually go away. These are called Braxton Hicks contractions, or false labor. Contractions may last for hours, days, or even weeks before true labor sets in. If contractions come at regular intervals, intensify, or become painful, it is best to be seen by your caregiver.  SIGNS OF LABOR  Menstrual-like cramps. Contractions that are 5 minutes apart or less. Contractions that start on the top of the uterus and spread down to the lower abdomen and back. A sense of increased pelvic pressure or back pain. A watery or bloody mucus discharge that comes from the vagina. If you have any of these signs before the 37th week of pregnancy, call your caregiver right away. You need to go to the hospital to get checked immediately. HOME CARE INSTRUCTIONS  Avoid all smoking, herbs, alcohol, and unprescribed drugs. These chemicals affect the formation and growth of the baby. Follow your caregiver's instructions regarding medicine use. There are medicines that are either safe or unsafe to take during pregnancy. Exercise only as directed by your caregiver. Experiencing uterine cramps is a good sign to  stop exercising. Continue to eat regular, healthy meals. Wear a good support bra for breast tenderness. Do not use hot tubs, steam rooms, or saunas. Wear your seat belt at all times when driving. Avoid raw meat, uncooked cheese, cat litter boxes, and soil used by cats. These carry germs that can cause birth defects in the baby. Take your prenatal vitamins. Try taking a stool softener (if your caregiver approves) if you develop constipation. Eat more high-fiber foods, such as fresh vegetables or fruit and whole grains. Drink plenty of fluids to keep your urine clear or pale yellow. Take warm sitz baths to soothe any pain or discomfort caused by hemorrhoids. Use hemorrhoid cream if   your caregiver approves. If you develop varicose veins, wear support hose. Elevate your feet for 15 minutes, 3-4 times a day. Limit salt in your diet. Avoid heavy lifting, wear low heal shoes, and practice good posture. Rest a lot with your legs elevated if you have leg cramps or low back pain. Visit your dentist if you have not gone during your pregnancy. Use a soft toothbrush to brush your teeth and be gentle when you floss. A sexual relationship may be continued unless your caregiver directs you otherwise. Do not travel far distances unless it is absolutely necessary and only with the approval of your caregiver. Take prenatal classes to understand, practice, and ask questions about the labor and delivery. Make a trial run to the hospital. Pack your hospital bag. Prepare the baby's nursery. Continue to go to all your prenatal visits as directed by your caregiver. SEEK MEDICAL CARE IF: You are unsure if you are in labor or if your water has broken. You have dizziness. You have mild pelvic cramps, pelvic pressure, or nagging pain in your abdominal area. You have persistent nausea, vomiting, or diarrhea. You have a bad smelling vaginal discharge. You have pain with urination. SEEK IMMEDIATE MEDICAL CARE IF:  You  have a fever. You are leaking fluid from your vagina. You have spotting or bleeding from your vagina. You have severe abdominal cramping or pain. You have rapid weight loss or gain. You have shortness of breath with chest pain. You notice sudden or extreme swelling of your face, hands, ankles, feet, or legs. You have not felt your baby move in over an hour. You have severe headaches that do not go away with medicine. You have vision changes. Document Released: 08/14/2001 Document Revised: 08/25/2013 Document Reviewed: 10/21/2012 ExitCare Patient Information 2015 [employer]. This information is not intended to replace advice given to you by your health care provider. Make sure you discuss any questions you have with your health care provider.       

## 2021-06-19 NOTE — Progress Notes (Signed)
[place] 32+4 wks,cephalic,fhr 148 bpm,AFI 22 cm,BPP 8/8,EFW 2755 g 99%,posterior placenta gr 0

## 2021-06-22 ENCOUNTER — Ambulatory Visit (INDEPENDENT_AMBULATORY_CARE_PROVIDER_SITE_OTHER): Payer: Medicaid Other | Admitting: *Deleted

## 2021-06-22 ENCOUNTER — Other Ambulatory Visit: Payer: Self-pay

## 2021-06-22 VITALS — BP 125/79 | HR 97 | Wt 201.0 lb

## 2021-06-22 DIAGNOSIS — O0993 Supervision of high risk pregnancy, unspecified, third trimester: Secondary | ICD-10-CM

## 2021-06-22 DIAGNOSIS — O288 Other abnormal findings on antenatal screening of mother: Secondary | ICD-10-CM

## 2021-06-22 DIAGNOSIS — E118 Type 2 diabetes mellitus with unspecified complications: Secondary | ICD-10-CM

## 2021-06-22 DIAGNOSIS — Z1389 Encounter for screening for other disorder: Secondary | ICD-10-CM

## 2021-06-22 DIAGNOSIS — Z331 Pregnant state, incidental: Secondary | ICD-10-CM

## 2021-06-22 LAB — POCT URINALYSIS DIPSTICK OB
Blood, UA: NEGATIVE
Glucose, UA: NEGATIVE
Ketones, UA: NEGATIVE
Leukocytes, UA: NEGATIVE
Nitrite, UA: NEGATIVE

## 2021-06-22 NOTE — Progress Notes (Addendum)
   NURSE VISIT- NST  SUBJECTIVE:  Brittany Wade is a 20 y.o. G1P0000 female at [redacted]w[redacted]d, here for a NST for pregnancy complicated by T2DM: Class C, currently on Metformin, Lantus .  She reports active fetal movement, contractions: none, vaginal bleeding: none, membranes: intact.   OBJECTIVE:  BP 125/79   Pulse 97   Wt 201 lb (91.2 kg)   LMP 11/03/2020 (Approximate)   BMI 31.48 kg/m   Appears well, no apparent distress  Results for orders placed or performed in visit on 06/22/21 (from the past 24 hour(s))  POC Urinalysis Dipstick OB   Collection Time: 06/22/21  9:44 AM  Result Value Ref Range   Color, UA     Clarity, UA     Glucose, UA Negative Negative   Bilirubin, UA     Ketones, UA neg    Spec Grav, UA     Blood, UA neg    pH, UA     POC,PROTEIN,UA Trace Negative, Trace, Small (1+), Moderate (2+), Large (3+), 4+   Urobilinogen, UA     Nitrite, UA neg    Leukocytes, UA Negative Negative   Appearance     Odor      NST: FHR baseline 145 bpm, Variability: moderate, Accelerations:present, Decelerations:  Absent= Cat 1/reactive Toco: frequent UI   ASSESSMENT: G1P0000 at [redacted]w[redacted]d with T2DM: Class C, currently on Metformin, Lantus NST reactive Speculum and cervical exam done by Dr Charlotta Newton d/t frequent UI. Cervix closed, thick  Pt unaware of irritability.   PLAN: EFM strip reviewed by Dr. Charlotta Newton   Recommendations: keep next appointment as scheduled    Jobe Marker  06/22/2021 10:45 AM  Chart reviewed for nurse visit. Agree with plan of care.  Myna Hidalgo, DO 06/22/2021 3:05 PM

## 2021-06-26 ENCOUNTER — Other Ambulatory Visit: Payer: Medicaid Other

## 2021-06-26 ENCOUNTER — Encounter: Payer: Medicaid Other | Admitting: Obstetrics & Gynecology

## 2021-06-26 ENCOUNTER — Other Ambulatory Visit: Payer: Self-pay | Admitting: Women's Health

## 2021-06-26 DIAGNOSIS — O24013 Pre-existing diabetes mellitus, type 1, in pregnancy, third trimester: Secondary | ICD-10-CM

## 2021-06-29 ENCOUNTER — Other Ambulatory Visit: Payer: Self-pay

## 2021-06-29 ENCOUNTER — Ambulatory Visit (INDEPENDENT_AMBULATORY_CARE_PROVIDER_SITE_OTHER): Payer: Medicaid Other | Admitting: *Deleted

## 2021-06-29 VITALS — BP 117/80 | HR 104 | Wt 201.0 lb

## 2021-06-29 DIAGNOSIS — O0993 Supervision of high risk pregnancy, unspecified, third trimester: Secondary | ICD-10-CM | POA: Diagnosis not present

## 2021-06-29 DIAGNOSIS — E118 Type 2 diabetes mellitus with unspecified complications: Secondary | ICD-10-CM | POA: Diagnosis not present

## 2021-06-29 DIAGNOSIS — Z3A34 34 weeks gestation of pregnancy: Secondary | ICD-10-CM | POA: Diagnosis not present

## 2021-06-29 NOTE — Progress Notes (Addendum)
   NURSE VISIT- NST  SUBJECTIVE:  Brittany Wade is a 20 y.o. G1P0000 female at [redacted]w[redacted]d, here for a NST for pregnancy complicated by T2DM: Class C, currently on Lantus .  She reports active fetal movement, contractions: none, vaginal bleeding: none, membranes: intact.   OBJECTIVE:  LMP 11/03/2020 (Approximate)   Appears well, no apparent distress  No results found for this or any previous visit (from the past 24 hour(s)).  NST: FHR baseline 135 bpm, Variability: moderate, Accelerations:present, Decelerations:  Absent= Cat 1/reactive Toco: none   ASSESSMENT: G1P0000 at [redacted]w[redacted]d with Lantus NST reactive  PLAN: EFM strip reviewed by Dr. Charlotta Newton   Recommendations: keep next appointment as scheduled    Jobe Marker  06/29/2021 10:09 AM  Chart reviewed for nurse visit. Agree with plan of care.  Myna Hidalgo, DO 07/12/2021 2:53 PM

## 2021-07-03 ENCOUNTER — Encounter: Payer: Self-pay | Admitting: Women's Health

## 2021-07-03 ENCOUNTER — Other Ambulatory Visit: Payer: Self-pay

## 2021-07-03 ENCOUNTER — Other Ambulatory Visit: Payer: Medicaid Other

## 2021-07-03 ENCOUNTER — Ambulatory Visit (INDEPENDENT_AMBULATORY_CARE_PROVIDER_SITE_OTHER): Payer: Medicaid Other | Admitting: Women's Health

## 2021-07-03 ENCOUNTER — Ambulatory Visit (INDEPENDENT_AMBULATORY_CARE_PROVIDER_SITE_OTHER): Payer: Medicaid Other

## 2021-07-03 ENCOUNTER — Encounter: Payer: Medicaid Other | Admitting: Obstetrics & Gynecology

## 2021-07-03 VITALS — BP 124/72 | HR 90 | Wt 204.0 lb

## 2021-07-03 DIAGNOSIS — O24013 Pre-existing diabetes mellitus, type 1, in pregnancy, third trimester: Secondary | ICD-10-CM

## 2021-07-03 DIAGNOSIS — O403XX0 Polyhydramnios, third trimester, not applicable or unspecified: Secondary | ICD-10-CM

## 2021-07-03 DIAGNOSIS — Z794 Long term (current) use of insulin: Secondary | ICD-10-CM

## 2021-07-03 DIAGNOSIS — Z3A34 34 weeks gestation of pregnancy: Secondary | ICD-10-CM

## 2021-07-03 DIAGNOSIS — E119 Type 2 diabetes mellitus without complications: Secondary | ICD-10-CM

## 2021-07-03 DIAGNOSIS — O409XX0 Polyhydramnios, unspecified trimester, not applicable or unspecified: Secondary | ICD-10-CM | POA: Insufficient documentation

## 2021-07-03 DIAGNOSIS — O0993 Supervision of high risk pregnancy, unspecified, third trimester: Secondary | ICD-10-CM

## 2021-07-03 LAB — POCT URINALYSIS DIPSTICK OB
Blood, UA: NEGATIVE
Glucose, UA: NEGATIVE
Ketones, UA: NEGATIVE
Leukocytes, UA: NEGATIVE
Nitrite, UA: NEGATIVE
POC,PROTEIN,UA: NEGATIVE

## 2021-07-03 NOTE — Patient Instructions (Signed)
Brittany Wade, thank you for choosing our office today! We appreciate the opportunity to meet your healthcare needs. You may receive a short survey by mail, e-mail, or through [employer]. If you are happy with your care we would appreciate if you could take just a few minutes to complete the survey questions. We read all of your comments and take your feedback very seriously. Thank you again for choosing our office.  Center for [employer] Team at [facility]  [facility] at [facility] ([address] [place], [place] [address]) Entrance C, located off of [address] Free 24/7 valet parking   CLASSES: Go to [email or web address] to register for classes (childbirth, breastfeeding, waterbirth, infant CPR, daddy bootcamp, etc.)  Call the office [phone]) or go to [facility] if: You begin to have strong, frequent contractions Your water breaks.  Sometimes it is a big gush of fluid, sometimes it is just a trickle that keeps getting your panties wet or running down your legs You have vaginal bleeding.  It is normal to have a small amount of spotting if your cervix was checked.  You don't feel your baby moving like normal.  If you don't, get you something to eat and drink and lay down and focus on feeling your baby move.   If your baby is still not moving like normal, you should call the office or go to [facility].  Call the office [phone]) or go to [facility] hospital for these signs of pre-eclampsia: Severe headache that does not go away with Tylenol Visual changes- seeing spots, double, blurred vision Pain under your right breast or upper abdomen that does not go away with Tums or heartburn medicine Nausea and/or vomiting Severe swelling in your hands, feet, and face   Tdap Vaccine It is recommended that you get the Tdap vaccine during the third trimester of EACH pregnancy to help protect your baby from getting pertussis (whooping cough) 27-36 weeks is the BEST time to do  this so that you can pass the protection on to your baby. During pregnancy is better than after pregnancy, but if you are unable to get it during pregnancy it will be offered at the hospital.  You can get this vaccine with [place], at the health department, your family doctor, or some local pharmacies Everyone who will be around your baby should also be up-to-date on their vaccines before the baby comes. Adults (who are not pregnant) only need 1 dose of Tdap during adulthood.   [facility] Pediatricians/Family Doctors [facility]): [address] Colette Ribas, [phone]           [facility]: [address], [phone]                [facility] Medicine [facility]): [address], [phone] (call to ask if accepting patients) [facility] Department: [address], [place], [phone]    [facility] Pediatricians/Family Doctors [facility]): [phone], [phone] [facility]: [address] [place], [phone] [facility]: [address], [phone]  [facility] Doctors  [facility]): [phone] [facility]: [address], [phone]   [facility] Doctors [facility]: [address], [phone]  [facility] Family Doctors  [facility]: [phone], [phone]  Home Blood Pressure Monitoring for Patients   Your provider has recommended that you check your  blood pressure (BP) at least once a week at home. If you do not have a blood pressure cuff at home, one will be provided for you. Contact your provider if you have not received your monitor within 1 week.   Helpful Tips for Accurate Home Blood Pressure Checks  Don't smoke, exercise, or drink caffeine 30 minutes before checking your BP Use the restroom before checking your BP (a full bladder can raise your  pressure) Relax in a comfortable upright chair Feet on the ground Left arm resting comfortably on a flat surface at the level of your heart Legs uncrossed Back supported Sit quietly and don't talk Place the cuff on your bare arm Adjust snuggly, so that only two fingertips can fit between your skin and the top of the cuff Check 2 readings separated by at least one minute Keep a log of your BP readings For a visual, please reference this diagram: http://ccnc.care/bpdiagram  Provider Name: [facility]     Phone: [phone]  Zone 1: ALL CLEAR  Continue to monitor your symptoms:  BP reading is less than 140 (top number) or less than 90 (bottom number)  No right upper stomach pain No headaches or seeing spots No feeling nauseated or throwing up No swelling in face and hands  Zone 2: CAUTION Call your doctor's office for any of the following:  BP reading is greater than 140 (top number) or greater than 90 (bottom number)  Stomach pain under your ribs in the middle or right side Headaches or seeing spots Feeling nauseated or throwing up Swelling in face and hands  Zone 3: EMERGENCY  Seek immediate medical care if you have any of the following:  BP reading is greater than160 (top number) or greater than 110 (bottom number) Severe headaches not improving with Tylenol Serious difficulty catching your breath Any worsening symptoms from Zone 2  Preterm Labor and Birth Information  The normal length of a pregnancy is 39-41 weeks. Preterm labor is when labor starts before 37 completed weeks of pregnancy. What are the risk factors for preterm labor? Preterm labor is more likely to occur in women who: Have certain infections during pregnancy such as a bladder infection, sexually transmitted infection, or infection inside the uterus (chorioamnionitis). Have a shorter-than-normal cervix. Have gone into preterm labor before. Have had surgery on their cervix. Are younger than age 17  or older than age 35. Are African American. Are pregnant with twins or multiple babies (multiple gestation). Take street drugs or smoke while pregnant. Do not gain enough weight while pregnant. Became pregnant shortly after having been pregnant. What are the symptoms of preterm labor? Symptoms of preterm labor include: Cramps similar to those that can happen during a menstrual period. The cramps may happen with diarrhea. Pain in the abdomen or lower back. Regular uterine contractions that may feel like tightening of the abdomen. A feeling of increased pressure in the pelvis. Increased watery or bloody mucus discharge from the vagina. Water breaking (ruptured amniotic sac). Why is it important to recognize signs of preterm labor? It is important to recognize signs of preterm labor because babies who are born prematurely may not be fully developed. This can put them at an increased risk for: Long-term (chronic) heart and lung problems. Difficulty immediately after birth with regulating body systems, including blood sugar, body temperature, heart rate, and breathing rate. Bleeding in the brain. Cerebral palsy. Learning difficulties. Death. These risks are highest for babies who are born before 34 weeks   of pregnancy. How is preterm labor treated? Treatment depends on the length of your pregnancy, your condition, and the health of your baby. It may involve: Having a stitch (suture) placed in your cervix to prevent your cervix from opening too early (cerclage). Taking or being given medicines, such as: Hormone medicines. These may be given early in pregnancy to help support the pregnancy. Medicine to stop contractions. Medicines to help mature the baby's lungs. These may be prescribed if the risk of delivery is high. Medicines to prevent your baby from developing cerebral palsy. If the labor happens before 34 weeks of pregnancy, you may need to stay in the hospital. What should I do if I  think I am in preterm labor? If you think that you are going into preterm labor, call your health care provider right away. How can I prevent preterm labor in future pregnancies? To increase your chance of having a full-term pregnancy: Do not use any tobacco products, such as cigarettes, chewing tobacco, and e-cigarettes. If you need help quitting, ask your health care provider. Do not use street drugs or medicines that have not been prescribed to you during your pregnancy. Talk with your health care provider before taking any herbal supplements, even if you have been taking them regularly. Make sure you gain a healthy amount of weight during your pregnancy. Watch for infection. If you think that you might have an infection, get it checked right away. Make sure to tell your health care provider if you have gone into preterm labor before. This information is not intended to replace advice given to you by your health care provider. Make sure you discuss any questions you have with your health care provider. Document Revised: 12/12/2018 Document Reviewed: 01/11/2016 Elsevier Patient Education  2020 Elsevier Inc.   

## 2021-07-03 NOTE — Progress Notes (Signed)
[place] 34 wks,cephalic,posterior placenta gr 0,polyhydramnios, AFI 27.8 cm,FHR 138 BPM,BPP 8/8

## 2021-07-03 NOTE — Progress Notes (Signed)
HIGH-RISK PREGNANCY VISIT Patient name: Brittany Wade MRN [id number]  Date of birth: 2001-08-18 Chief Complaint:   Routine Prenatal Visit (BPP today)  History of Present Illness:   Brittany Wade is a 20 y.o. G40P0000 female at [redacted]w[redacted]d with an Estimated Date of Delivery: 08/10/21 being seen today for ongoing management of a high-risk pregnancy complicated by diabetes mellitus T2DM: Class C, currently on metformin 1000mg  BID, Lantus 18u qhs, novolog 2-5u w/ supper, LGA 99%, polyhydramnios 27.8cm today .    Today she reports  FBS 66-108 (only 1 >95), 2hr pp 88-123 (only 2 >120) . Hasn't heard from anyone about scheduling fetal echo. Contractions: Not present. Vag. Bleeding: None.  Movement: Present. denies leaking of fluid.   Depression screen [facility] 2/9 05/22/2021 02/07/2021 02/07/2021 12/26/2020 10/04/2020  Decreased Interest 1 1 1  0 0  Down, Depressed, Hopeless 0 0 0 0 0  PHQ - 2 Score 1 1 1  0 0  Altered sleeping 1 1 1  0 -  Tired, decreased energy 0 1 1 1  -  Change in appetite 0 0 0 1 -  Feeling bad or failure about yourself  0 1 1 0 -  Trouble concentrating 0 0 0 0 -  Moving slowly or fidgety/restless 0 0 0 0 -  Suicidal thoughts 0 0 0 0 -  PHQ-9 Score 2 4 4 2  -  Difficult doing work/chores - - - - -     GAD 7 : Generalized Anxiety Score 05/22/2021 02/07/2021 02/07/2021 12/26/2020  Nervous, Anxious, on Edge 1 0 0 1  Control/stop worrying 0 1 1 1   Worry too much - different things 0 1 1 0  Trouble relaxing 1 1 1  0  Restless 0 0 0 0  Easily annoyed or irritable 0 2 2 1   Afraid - awful might happen 0 0 0 0  Total GAD 7 Score 2 5 5 3      Review of Systems:   Pertinent items are noted in HPI Denies abnormal vaginal discharge w/ itching/odor/irritation, headaches, visual changes, shortness of breath, chest pain, abdominal pain, severe nausea/vomiting, or problems with urination or bowel movements unless otherwise stated above. Pertinent History Reviewed:  Reviewed past medical,surgical, social,  obstetrical and family history.  Reviewed problem list, medications and allergies. Physical Assessment:   Vitals:   07/03/21 1203  BP: 124/72  Pulse: 90  Weight: 204 lb (92.5 kg)  Body mass index is 31.95 kg/m.           Physical Examination:   General appearance: alert, well appearing, and in no distress  Mental status: alert, oriented to person, place, and time  Skin: warm & dry   Extremities: Edema: Trace    Cardiovascular: normal heart rate noted  Respiratory: normal respiratory effort, no distress  Abdomen: gravid, soft, non-tender  Pelvic: Cervical exam deferred         Fetal Status: Fetal Heart Rate (bpm): 138 u/s   Movement: Present    Fetal Surveillance Testing today:  34 wks,cephalic,posterior placenta gr 0,polyhydramnios, AFI 27.8 cm,FHR 138 BPM,BPP 8/8  Chaperone: N/A    Results for orders placed or performed in visit on 07/03/21 (from the past 24 hour(s))  POC Urinalysis Dipstick OB   Collection Time: 07/03/21 11:42 AM  Result Value Ref Range   Color, UA     Clarity, UA     Glucose, UA Negative Negative   Bilirubin, UA     Ketones, UA neg    Spec Grav, UA  Blood, UA neg    pH, UA     POC,PROTEIN,UA Negative Negative, Trace, Small (1+), Moderate (2+), Large (3+), 4+   Urobilinogen, UA     Nitrite, UA neg    Leukocytes, UA Negative Negative   Appearance     Odor      Assessment & Plan:  High-risk pregnancy: G1P0000 at [redacted]w[redacted]d with an Estimated Date of Delivery: 08/10/21   1) T2DM-Class C IDDM, stable on metformin 1,000mg  BID, Lantus 18u qhs, Novolog 2-5u w/ supper. Never heard from anyone about scheduling fetal echo, will route note to Tish to check  2) Suspected fetal macrosomia, EFW 99% @ 32wks  3) New dx polyhydramnios> AFI 27.8cm today  Meds: No orders of the defined types were placed in this encounter.   Labs/procedures today: U/S  Treatment Plan:  2x/wk testing, IOL @ 39wks  Reviewed: Preterm labor symptoms and general obstetric  precautions including but not limited to vaginal bleeding, contractions, leaking of fluid and fetal movement were reviewed in detail with the patient.  All questions were answered. Does have home bp cuff. Office bp cuff given: not applicable. Check bp weekly, let us know if consistently >140 and/or >90.  Follow-up: Return for As scheduled.   Future Appointments  Date Time Provider [place]  07/06/2021 11:10 AM [facility] NURSE [facility]  07/10/2021  3:00 PM [place] - [facility]TO[facility]YN [place] [facility] None  07/10/2021  3:50 PM Florian Buff, MD [facility]  07/13/2021  9:50 AM [facility] NURSE [facility]  07/17/2021  1:30 PM [place] - FTOBGYN [place] [facility] None  07/17/2021  2:30 PM Florian Buff, MD [facility]  07/20/2021  9:30 AM [facility] NURSE [facility]  07/24/2021  1:30 PM [employer] - FTOBGYN [place] [facility] None  07/24/2021  2:30 PM Florian Buff, MD [facility]  08/01/2021  1:30 PM [place] - FTOBGYN [place] [facility] None  08/01/2021  2:50 PM Janyth Pupa, DO [facility]  08/03/2021  9:30 AM [facility] NURSE [facility]    Orders Placed This Encounter  Procedures   POC Urinalysis Dipstick OB   Roma Schanz CNM, WHNP-BC 07/03/2021 12:24 PM

## 2021-07-06 ENCOUNTER — Ambulatory Visit (INDEPENDENT_AMBULATORY_CARE_PROVIDER_SITE_OTHER): Payer: Medicaid Other | Admitting: *Deleted

## 2021-07-06 ENCOUNTER — Other Ambulatory Visit: Payer: Self-pay

## 2021-07-06 ENCOUNTER — Other Ambulatory Visit: Payer: Self-pay | Admitting: Women's Health

## 2021-07-06 VITALS — BP 122/68 | HR 84

## 2021-07-06 DIAGNOSIS — E118 Type 2 diabetes mellitus with unspecified complications: Secondary | ICD-10-CM | POA: Diagnosis not present

## 2021-07-06 DIAGNOSIS — O0993 Supervision of high risk pregnancy, unspecified, third trimester: Secondary | ICD-10-CM | POA: Diagnosis not present

## 2021-07-06 DIAGNOSIS — O409XX0 Polyhydramnios, unspecified trimester, not applicable or unspecified: Secondary | ICD-10-CM

## 2021-07-06 DIAGNOSIS — Z3A35 35 weeks gestation of pregnancy: Secondary | ICD-10-CM | POA: Diagnosis not present

## 2021-07-06 DIAGNOSIS — O24113 Pre-existing diabetes mellitus, type 2, in pregnancy, third trimester: Secondary | ICD-10-CM

## 2021-07-06 LAB — POCT URINALYSIS DIPSTICK OB
Blood, UA: NEGATIVE
Glucose, UA: NEGATIVE
Ketones, UA: NEGATIVE
Leukocytes, UA: NEGATIVE
Nitrite, UA: NEGATIVE
POC,PROTEIN,UA: NEGATIVE

## 2021-07-06 NOTE — Progress Notes (Addendum)
   NURSE VISIT- NST  SUBJECTIVE:  Brittany Wade is a 20 y.o. G1P0000 female at [redacted]w[redacted]d, here for a NST for pregnancy complicated by T2DM: Class C, currently on Insulin .  She reports active fetal movement, contractions: none, vaginal bleeding: none, membranes: intact.   OBJECTIVE:  LMP 11/03/2020 (Approximate)   Appears well, no apparent distress  No results found for this or any previous visit (from the past 24 hour(s)).  NST: FHR baseline 145 bpm, Variability: moderate, Accelerations:present, Decelerations:  Absent= Cat 1/reactive Toco: UI   ASSESSMENT: G1P0000 at [redacted]w[redacted]d with T2DM: Class C, currently on Insulin NST reactive  PLAN: EFM strip reviewed by Dr. Despina Hidden   Recommendations: keep next appointment as scheduled    Jobe Marker  07/06/2021 11:42 AM  Attestation of Attending Supervision of Nursing Visit Encounter: Evaluation and management procedures were performed by the nursing staff under my supervision and collaboration.  I have reviewed the nurse's note and chart, and I agree with the management and plan.  Rockne Coons MD Attending Physician for the Center for [facility] 07/14/2021 10:11 AM

## 2021-07-10 ENCOUNTER — Ambulatory Visit (INDEPENDENT_AMBULATORY_CARE_PROVIDER_SITE_OTHER): Payer: Medicaid Other

## 2021-07-10 ENCOUNTER — Other Ambulatory Visit: Payer: Self-pay

## 2021-07-10 ENCOUNTER — Encounter: Payer: Self-pay | Admitting: Obstetrics & Gynecology

## 2021-07-10 ENCOUNTER — Ambulatory Visit (INDEPENDENT_AMBULATORY_CARE_PROVIDER_SITE_OTHER): Payer: Medicaid Other | Admitting: Obstetrics & Gynecology

## 2021-07-10 VITALS — BP 125/78 | HR 99 | Wt 202.0 lb

## 2021-07-10 DIAGNOSIS — O403XX0 Polyhydramnios, third trimester, not applicable or unspecified: Secondary | ICD-10-CM

## 2021-07-10 DIAGNOSIS — O409XX0 Polyhydramnios, unspecified trimester, not applicable or unspecified: Secondary | ICD-10-CM | POA: Diagnosis not present

## 2021-07-10 DIAGNOSIS — Z3A35 35 weeks gestation of pregnancy: Secondary | ICD-10-CM

## 2021-07-10 DIAGNOSIS — E118 Type 2 diabetes mellitus with unspecified complications: Secondary | ICD-10-CM

## 2021-07-10 DIAGNOSIS — O24113 Pre-existing diabetes mellitus, type 2, in pregnancy, third trimester: Secondary | ICD-10-CM

## 2021-07-10 DIAGNOSIS — O0993 Supervision of high risk pregnancy, unspecified, third trimester: Secondary | ICD-10-CM

## 2021-07-10 DIAGNOSIS — O24319 Unspecified pre-existing diabetes mellitus in pregnancy, unspecified trimester: Secondary | ICD-10-CM

## 2021-07-10 NOTE — Progress Notes (Addendum)
[place] 35+4 wks,cephalic,fhr 150 bpm,polyhydramnios,AFI 26 cm,posterior placenta 3,BPP 8/8

## 2021-07-10 NOTE — Progress Notes (Signed)
HIGH-RISK PREGNANCY VISIT Patient name: Brittany Wade MRN [id number]  Date of birth: February 21, 2001 Chief Complaint:   Routine Prenatal Visit  History of Present Illness:   Brittany Wade is a 20 y.o. G52P0000 female at [redacted]w[redacted]d with an Estimated Date of Delivery: 08/10/21 being seen today for ongoing management of a high-risk pregnancy complicated by diabetes mellitus Class C DM, secondary polyhydramnios.    Today she reports no complaints. Contractions: Not present. Vag. Bleeding: None.  Movement: Present. denies leaking of fluid.   Depression screen [facility] 2/9 05/22/2021 02/07/2021 02/07/2021 12/26/2020 10/04/2020  Decreased Interest 1 1 1  0 0  Down, Depressed, Hopeless 0 0 0 0 0  PHQ - 2 Score 1 1 1  0 0  Altered sleeping 1 1 1  0 -  Tired, decreased energy 0 1 1 1  -  Change in appetite 0 0 0 1 -  Feeling bad or failure about yourself  0 1 1 0 -  Trouble concentrating 0 0 0 0 -  Moving slowly or fidgety/restless 0 0 0 0 -  Suicidal thoughts 0 0 0 0 -  PHQ-9 Score 2 4 4 2  -  Difficult doing work/chores - - - - -     GAD 7 : Generalized Anxiety Score 05/22/2021 02/07/2021 02/07/2021 12/26/2020  Nervous, Anxious, on Edge 1 0 0 1  Control/stop worrying 0 1 1 1   Worry too much - different things 0 1 1 0  Trouble relaxing 1 1 1  0  Restless 0 0 0 0  Easily annoyed or irritable 0 2 2 1   Afraid - awful might happen 0 0 0 0  Total GAD 7 Score 2 5 5 3      Review of Systems:   Pertinent items are noted in HPI Denies abnormal vaginal discharge w/ itching/odor/irritation, headaches, visual changes, shortness of breath, chest pain, abdominal pain, severe nausea/vomiting, or problems with urination or bowel movements unless otherwise stated above. Pertinent History Reviewed:  Reviewed past medical,surgical, social, obstetrical and family history.  Reviewed problem list, medications and allergies. Physical Assessment:   Vitals:   07/10/21 1533  BP: 125/78  Pulse: 99  Weight: 202 lb (91.6 kg)  Body mass  index is 31.64 kg/m.           Physical Examination:   General appearance: alert, well appearing, and in no distress  Mental status: alert, oriented to person, place, and time  Skin: warm & dry   Extremities: Edema: Trace    Cardiovascular: normal heart rate noted  Respiratory: normal respiratory effort, no distress  Abdomen: gravid, soft, non-tender  Pelvic: Cervical exam deferred         Fetal Status:     Movement: Present    Fetal Surveillance Testing today: BPP 8/8 26AFI   Chaperone: N/A    No results found for this or any previous visit (from the past 24 hour(s)).  Assessment & Plan:  High-risk pregnancy: G1P0000 at [redacted]w[redacted]d with an Estimated Date of Delivery: 08/10/21   1) Class C DM well controlled on metformin 1 gram BID + lantus 18 qhs + novolog 2-5 with supper, stable  2) polyhydramnios, mild, LGA 99%  Meds: No orders of the defined types were placed in this encounter.   Labs/procedures today: U/S  Treatment Plan:  twice weekly surveillance, decision regarding delivery modeif gets to 39 week range  Reviewed: Preterm labor symptoms and general obstetric precautions including but not limited to vaginal bleeding, contractions, leaking of fluid and fetal movement  were reviewed in detail with the patient.  All questions were answered. Does have home bp cuff. Office bp cuff given: not applicable. Check bp weekly, let us know if consistently >140 and/or >90.  Follow-up: No follow-ups on file.   Future Appointments  Date Time Provider [place]  07/13/2021  9:50 AM [facility] NURSE [facility]  07/17/2021  1:30 PM [place] - FTOBGYN [place] [facility] None  07/17/2021  2:30 PM Florian Buff, MD [facility]  07/20/2021  9:30 AM [facility] NURSE [facility]  07/24/2021  1:30 PM [place] - FTOBGYN [place] [facility] None  07/24/2021  2:30 PM Florian Buff, MD [facility]  08/01/2021  1:30 PM [place] - FTOBGYN [place] [facility] None  08/01/2021  2:50 PM Janyth Pupa, DO [facility]FT  FTOBGYN  08/03/2021  9:30 AM [facility] NURSE [facility]    No orders of the defined types were placed in this encounter.  Florian Buff  07/10/2021 4:06 PM

## 2021-07-13 ENCOUNTER — Other Ambulatory Visit: Payer: Self-pay

## 2021-07-13 ENCOUNTER — Ambulatory Visit (INDEPENDENT_AMBULATORY_CARE_PROVIDER_SITE_OTHER): Payer: Medicaid Other | Admitting: *Deleted

## 2021-07-13 VITALS — BP 123/81 | HR 115 | Wt 206.0 lb

## 2021-07-13 DIAGNOSIS — O0993 Supervision of high risk pregnancy, unspecified, third trimester: Secondary | ICD-10-CM | POA: Diagnosis not present

## 2021-07-13 DIAGNOSIS — E118 Type 2 diabetes mellitus with unspecified complications: Secondary | ICD-10-CM | POA: Diagnosis not present

## 2021-07-13 DIAGNOSIS — Z331 Pregnant state, incidental: Secondary | ICD-10-CM

## 2021-07-13 DIAGNOSIS — Z1389 Encounter for screening for other disorder: Secondary | ICD-10-CM

## 2021-07-13 DIAGNOSIS — O288 Other abnormal findings on antenatal screening of mother: Secondary | ICD-10-CM

## 2021-07-13 LAB — POCT URINALYSIS DIPSTICK OB
Blood, UA: NEGATIVE
Glucose, UA: NEGATIVE
Ketones, UA: NEGATIVE
Leukocytes, UA: NEGATIVE
Nitrite, UA: NEGATIVE

## 2021-07-13 NOTE — Progress Notes (Addendum)
   NURSE VISIT- NST  SUBJECTIVE:  Brittany Wade is a 20 y.o. G1P0000 female at 109w0d, here for a NST for pregnancy complicated by T2DM: Class C, currently on Metformin, Lantus, Novolog .  She reports active fetal movement, contractions: none, vaginal bleeding: none, membranes: intact.   OBJECTIVE:  BP 123/81   Pulse (!) 115   Wt 206 lb (93.4 kg)   LMP 11/03/2020 (Approximate)   BMI 32.26 kg/m   Appears well, no apparent distress  Results for orders placed or performed in visit on 07/13/21 (from the past 24 hour(s))  POC Urinalysis Dipstick OB   Collection Time: 07/13/21 10:13 AM  Result Value Ref Range   Color, UA     Clarity, UA     Glucose, UA Negative Negative   Bilirubin, UA     Ketones, UA neg    Spec Grav, UA     Blood, UA neg    pH, UA     POC,PROTEIN,UA Trace Negative, Trace, Small (1+), Moderate (2+), Large (3+), 4+   Urobilinogen, UA     Nitrite, UA neg    Leukocytes, UA Negative Negative   Appearance     Odor      NST: FHR baseline 140 bpm, Variability: moderate, Accelerations:present, Decelerations:  Absent= Cat 1/reactive Toco: none   ASSESSMENT: G1P0000 at [redacted]w[redacted]d with T2DM: Class C, currently on Metformin, Lantus and Novolog NST reactive  PLAN: EFM strip reviewed by Joellyn Haff, CNM, [facility]   Recommendations: keep next appointment as scheduled    Jobe Marker  07/13/2021 10:27 AM  Chart reviewed for nurse visit. Agree with plan of care.  Cheral Marker, [place] 07/13/2021 12:18 PM

## 2021-07-14 ENCOUNTER — Other Ambulatory Visit: Payer: Self-pay | Admitting: Obstetrics & Gynecology

## 2021-07-14 DIAGNOSIS — O409XX0 Polyhydramnios, unspecified trimester, not applicable or unspecified: Secondary | ICD-10-CM

## 2021-07-14 DIAGNOSIS — O24113 Pre-existing diabetes mellitus, type 2, in pregnancy, third trimester: Secondary | ICD-10-CM

## 2021-07-17 ENCOUNTER — Ambulatory Visit (INDEPENDENT_AMBULATORY_CARE_PROVIDER_SITE_OTHER): Payer: Medicaid Other

## 2021-07-17 ENCOUNTER — Other Ambulatory Visit: Payer: Self-pay

## 2021-07-17 ENCOUNTER — Ambulatory Visit (INDEPENDENT_AMBULATORY_CARE_PROVIDER_SITE_OTHER): Payer: Medicaid Other | Admitting: Obstetrics & Gynecology

## 2021-07-17 ENCOUNTER — Other Ambulatory Visit (HOSPITAL_COMMUNITY)
Admission: RE | Admit: 2021-07-17 | Discharge: 2021-07-17 | Disposition: A | Discharge status: Home / Self Care | Payer: Medicaid Other | Source: Ambulatory Visit | Attending: Women's Health | Admitting: Women's Health

## 2021-07-17 ENCOUNTER — Encounter: Payer: Self-pay | Admitting: Obstetrics & Gynecology

## 2021-07-17 VITALS — BP 129/79 | HR 94 | Wt 210.0 lb

## 2021-07-17 DIAGNOSIS — O409XX0 Polyhydramnios, unspecified trimester, not applicable or unspecified: Secondary | ICD-10-CM | POA: Diagnosis not present

## 2021-07-17 DIAGNOSIS — O0993 Supervision of high risk pregnancy, unspecified, third trimester: Secondary | ICD-10-CM

## 2021-07-17 DIAGNOSIS — Z3A36 36 weeks gestation of pregnancy: Secondary | ICD-10-CM

## 2021-07-17 DIAGNOSIS — O24313 Unspecified pre-existing diabetes mellitus in pregnancy, third trimester: Secondary | ICD-10-CM | POA: Diagnosis present

## 2021-07-17 DIAGNOSIS — O24319 Unspecified pre-existing diabetes mellitus in pregnancy, unspecified trimester: Secondary | ICD-10-CM

## 2021-07-17 DIAGNOSIS — O24113 Pre-existing diabetes mellitus, type 2, in pregnancy, third trimester: Secondary | ICD-10-CM | POA: Diagnosis not present

## 2021-07-17 DIAGNOSIS — E118 Type 2 diabetes mellitus with unspecified complications: Secondary | ICD-10-CM | POA: Diagnosis not present

## 2021-07-17 LAB — POCT URINALYSIS DIPSTICK OB
Blood, UA: NEGATIVE
Glucose, UA: NEGATIVE
Ketones, UA: NEGATIVE
Leukocytes, UA: NEGATIVE
Nitrite, UA: NEGATIVE

## 2021-07-17 LAB — OB RESULTS CONSOLE GC/CHLAMYDIA: Gonorrhea: NEGATIVE

## 2021-07-17 NOTE — Progress Notes (Signed)
[place] 36+4 wks,cephalic,BPP 8/8,FHR 135 bpm,posterior placenta gr 3,AFI 21 cm,EFW 3963 g 99.6%

## 2021-07-17 NOTE — Progress Notes (Signed)
HIGH-RISK PREGNANCY VISIT Patient name: Brittany Wade MRN [id number]  Date of birth: 11/17/2000 Chief Complaint:   Routine Prenatal Visit, High Risk Gestation, and Pregnancy Ultrasound  History of Present Illness:   Brittany Wade is a 20 y.o. G12P0000 female at [redacted]w[redacted]d with an Estimated Date of Delivery: 08/10/21 being seen today for ongoing management of a high-risk pregnancy complicated by Class C DM on metformin lantus novolog  Today she reports no complaints. Contractions: Not present. Vag. Bleeding: None.  Movement: Present. denies leaking of fluid.   Depression screen [facility] 2/9 05/22/2021 02/07/2021 02/07/2021 12/26/2020 10/04/2020  Decreased Interest 1 1 1  0 0  Down, Depressed, Hopeless 0 0 0 0 0  PHQ - 2 Score 1 1 1  0 0  Altered sleeping 1 1 1  0 -  Tired, decreased energy 0 1 1 1  -  Change in appetite 0 0 0 1 -  Feeling bad or failure about yourself  0 1 1 0 -  Trouble concentrating 0 0 0 0 -  Moving slowly or fidgety/restless 0 0 0 0 -  Suicidal thoughts 0 0 0 0 -  PHQ-9 Score 2 4 4 2  -  Difficult doing work/chores - - - - -     GAD 7 : Generalized Anxiety Score 05/22/2021 02/07/2021 02/07/2021 12/26/2020  Nervous, Anxious, on Edge 1 0 0 1  Control/stop worrying 0 1 1 1   Worry too much - different things 0 1 1 0  Trouble relaxing 1 1 1  0  Restless 0 0 0 0  Easily annoyed or irritable 0 2 2 1   Afraid - awful might happen 0 0 0 0  Total GAD 7 Score 2 5 5 3      Review of Systems:   Pertinent items are noted in HPI Denies abnormal vaginal discharge w/ itching/odor/irritation, headaches, visual changes, shortness of breath, chest pain, abdominal pain, severe nausea/vomiting, or problems with urination or bowel movements unless otherwise stated above. Pertinent History Reviewed:  Reviewed past medical,surgical, social, obstetrical and family history.  Reviewed problem list, medications and allergies. Physical Assessment:   Vitals:   07/17/21 1407  BP: 129/79  Pulse: 94  Weight:  210 lb (95.3 kg)  Body mass index is 32.89 kg/m.           Physical Examination:   General appearance: alert, well appearing, and in no distress  Mental status: alert, oriented to person, place, and time  Skin: warm & dry   Extremities: Edema: None    Cardiovascular: normal heart rate noted  Respiratory: normal respiratory effort, no distress  Abdomen: gravid, soft, non-tender  Pelvic:  Dilation: 1.5 Effacement (%): Thick Station: Ballotable  Fetal Status:     Movement: Present Presentation: Vertex  Fetal Surveillance Testing today: BPP 8/8 EFW 99.6%   Chaperone: Marcelino Scot    Results for orders placed or performed in visit on 07/17/21 (from the past 24 hour(s))  POC Urinalysis Dipstick OB   Collection Time: 07/17/21  2:09 PM  Result Value Ref Range   Color, UA     Clarity, UA     Glucose, UA Negative Negative   Bilirubin, UA     Ketones, UA neg    Spec Grav, UA     Blood, UA neg    pH, UA     POC,PROTEIN,UA Trace Negative, Trace, Small (1+), Moderate (2+), Large (3+), 4+   Urobilinogen, UA     Nitrite, UA neg    Leukocytes, UA Negative Negative  Appearance     Odor      Assessment & Plan:  High-risk pregnancy: G1P0000 at [redacted]w[redacted]d with an Estimated Date of Delivery: 08/10/21   1) Class C DM under good control with fetal macrosomia extraoplates to 4500 grams at 39 weeks, discussed delivery mode, understands if we get to 38-39 will have a decision to make, leaning primary section at this time,   2) Anemia, taking her iron,  As above Meds: No orders of the defined types were placed in this encounter.   Labs/procedures today: U/S  Treatment Plan:  as above  Reviewed: Preterm labor symptoms and general obstetric precautions including but not limited to vaginal bleeding, contractions, leaking of fluid and fetal movement were reviewed in detail with the patient.  All questions were answered. Does have home bp cuff. Office bp cuff given: not applicable. Check bp daily, let  us know if consistently >140 and/or >90.  Follow-up: Return for keep scheduled.   Future Appointments  Date Time Provider Department Center  07/20/2021  9:30 AM [facility] NURSE [facility]  07/24/2021  1:30 PM [facility] [place] [facility] None  07/24/2021  2:30 PM Lazaro Arms, MD [facility]  08/01/2021  1:30 PM [facility] - [facility] [place] [facility] None  08/01/2021  2:50 PM Myna Hidalgo, DO [facility]  08/03/2021  9:30 AM [facility] NURSE [facility]    Orders Placed This Encounter  Procedures   Strep Gp B NAA   POC Urinalysis Dipstick OB   Amaryllis Dyke Hiawatha Dressel  07/17/2021 2:36 PM

## 2021-07-18 ENCOUNTER — Other Ambulatory Visit: Payer: Self-pay | Admitting: Obstetrics & Gynecology

## 2021-07-18 DIAGNOSIS — O24113 Pre-existing diabetes mellitus, type 2, in pregnancy, third trimester: Secondary | ICD-10-CM

## 2021-07-19 LAB — STREP GP B NAA: Strep Gp B NAA: NEGATIVE

## 2021-07-19 LAB — CERVICOVAGINAL ANCILLARY ONLY
Chlamydia: NEGATIVE
Comment: NEGATIVE
Comment: NORMAL
Neisseria Gonorrhea: NEGATIVE

## 2021-07-20 ENCOUNTER — Other Ambulatory Visit: Payer: Medicaid Other

## 2021-07-23 ENCOUNTER — Encounter: Payer: Self-pay | Admitting: Obstetrics & Gynecology

## 2021-07-24 ENCOUNTER — Other Ambulatory Visit: Payer: Self-pay

## 2021-07-24 ENCOUNTER — Ambulatory Visit (INDEPENDENT_AMBULATORY_CARE_PROVIDER_SITE_OTHER): Payer: Medicaid Other | Admitting: Obstetrics & Gynecology

## 2021-07-24 ENCOUNTER — Encounter: Payer: Self-pay | Admitting: Obstetrics & Gynecology

## 2021-07-24 ENCOUNTER — Ambulatory Visit (INDEPENDENT_AMBULATORY_CARE_PROVIDER_SITE_OTHER): Payer: Medicaid Other

## 2021-07-24 VITALS — BP 123/76 | HR 100 | Wt 212.5 lb

## 2021-07-24 DIAGNOSIS — O24113 Pre-existing diabetes mellitus, type 2, in pregnancy, third trimester: Secondary | ICD-10-CM

## 2021-07-24 DIAGNOSIS — O0993 Supervision of high risk pregnancy, unspecified, third trimester: Secondary | ICD-10-CM

## 2021-07-24 DIAGNOSIS — Z3A37 37 weeks gestation of pregnancy: Secondary | ICD-10-CM

## 2021-07-24 DIAGNOSIS — O24319 Unspecified pre-existing diabetes mellitus in pregnancy, unspecified trimester: Secondary | ICD-10-CM

## 2021-07-24 LAB — POCT URINALYSIS DIPSTICK OB
Blood, UA: NEGATIVE
Glucose, UA: NEGATIVE
Ketones, UA: NEGATIVE
Leukocytes, UA: NEGATIVE
Nitrite, UA: NEGATIVE

## 2021-07-24 NOTE — Progress Notes (Signed)
[place] 37+4 wks,cephalic,BPP 8/8,FHR 154 bpm,AFI 18 cm,posterior placenta gr 3

## 2021-07-24 NOTE — Progress Notes (Signed)
HIGH-RISK PREGNANCY VISIT Patient name: Brittany Wade MRN [id number]  Date of birth: [birth year] Chief Complaint:   High Risk Gestation ([place] today; feet and legs swelling; pelvic pressure)  History of Present Illness:   Brittany Wade is a 20 y.o. G87P0000 female at [redacted]w[redacted]d with an Estimated Date of Delivery: 08/10/21 being seen today for ongoing management of a high-risk pregnancy complicated by Class C DM.    Today she reports no complaints. Contractions: Irregular. Vag. Bleeding: None.  Movement: Present. denies leaking of fluid.   Depression screen [facility] 2/9 05/22/2021 02/07/2021 02/07/2021 12/26/2020 10/04/2020  Decreased Interest 1 1 1  0 0  Down, Depressed, Hopeless 0 0 0 0 0  PHQ - 2 Score 1 1 1  0 0  Altered sleeping 1 1 1  0 -  Tired, decreased energy 0 1 1 1  -  Change in appetite 0 0 0 1 -  Feeling bad or failure about yourself  0 1 1 0 -  Trouble concentrating 0 0 0 0 -  Moving slowly or fidgety/restless 0 0 0 0 -  Suicidal thoughts 0 0 0 0 -  PHQ-9 Score 2 4 4 2  -  Difficult doing work/chores - - - - -     GAD 7 : Generalized Anxiety Score 05/22/2021 02/07/2021 02/07/2021 12/26/2020  Nervous, Anxious, on Edge 1 0 0 1  Control/stop worrying 0 1 1 1   Worry too much - different things 0 1 1 0  Trouble relaxing 1 1 1  0  Restless 0 0 0 0  Easily annoyed or irritable 0 2 2 1   Afraid - awful might happen 0 0 0 0  Total GAD 7 Score 2 5 5 3      Review of Systems:   Pertinent items are noted in HPI Denies abnormal vaginal discharge w/ itching/odor/irritation, headaches, visual changes, shortness of breath, chest pain, abdominal pain, severe nausea/vomiting, or problems with urination or bowel movements unless otherwise stated above. Pertinent History Reviewed:  Reviewed past medical,surgical, social, obstetrical and family history.  Reviewed problem list, medications and allergies. Physical Assessment:   Vitals:   07/24/21 1404  BP: 123/76  Pulse: 100  Weight: 212 lb 8 oz (96.4 kg)   Body mass index is 33.28 kg/m.           Physical Examination:   General appearance: alert, well appearing, and in no distress  Mental status: alert, oriented to person, place, and time  Skin: warm & dry   Extremities: Edema: Trace    Cardiovascular: normal heart rate noted  Respiratory: normal respiratory effort, no distress  Abdomen: gravid, soft, non-tender  Pelvic: Cervical exam performed  Dilation: 2 Effacement (%): 20 Station: -3  Fetal Status:     Movement: Present Presentation: Vertex  Fetal Surveillance Testing today: BPP 8/8, normal fluid   Chaperone: 05/24/2021    No results found for this or any previous visit (from the past 24 hour(s)).  Assessment & Plan:  High-risk pregnancy: G1P0000 at [redacted]w[redacted]d with an Estimated Date of Delivery: 08/10/21   1) Class C DM, good control, EFW 99% but exam does not seem as large metformin 1 gram BID lantus 18 novolog 0-2 at supper    Meds: No orders of the defined types were placed in this encounter.   Labs/procedures today: U/S  Treatment Plan:  twice weekly surveillance, right now plan IOL 39 weeks  Reviewed: Term labor symptoms and general obstetric precautions including but not limited to vaginal bleeding, contractions, leaking  of fluid and fetal movement were reviewed in detail with the patient.  All questions were answered. Does have home bp cuff. Office bp cuff given: yes. Check bp daily, let us know if consistently >140 and/or >90.  Follow-up: No follow-ups on file.   Future Appointments  Date Time Provider [place]  07/24/2021  2:30 PM Florian Buff, MD [facility]  08/01/2021  1:30 PM [facility] - [facility] [place] [facility] None  08/01/2021  2:50 PM Janyth Pupa, DO [facility]  08/03/2021  9:30 AM [facility] NURSE [facility]    Orders Placed This Encounter  Procedures   POC Urinalysis Dipstick OB   Mertie Clause Charlestine Rookstool  07/24/2021 2:15 PM

## 2021-07-26 ENCOUNTER — Other Ambulatory Visit: Payer: Self-pay | Admitting: Obstetrics & Gynecology

## 2021-07-26 DIAGNOSIS — O24113 Pre-existing diabetes mellitus, type 2, in pregnancy, third trimester: Secondary | ICD-10-CM

## 2021-08-01 ENCOUNTER — Ambulatory Visit (INDEPENDENT_AMBULATORY_CARE_PROVIDER_SITE_OTHER): Payer: Medicaid Other | Admitting: Obstetrics & Gynecology

## 2021-08-01 ENCOUNTER — Encounter: Payer: Medicaid Other | Admitting: Women's Health

## 2021-08-01 ENCOUNTER — Ambulatory Visit (INDEPENDENT_AMBULATORY_CARE_PROVIDER_SITE_OTHER): Payer: Medicaid Other

## 2021-08-01 ENCOUNTER — Other Ambulatory Visit: Payer: Self-pay

## 2021-08-01 VITALS — BP 130/84 | HR 84 | Wt 215.4 lb

## 2021-08-01 DIAGNOSIS — O0993 Supervision of high risk pregnancy, unspecified, third trimester: Secondary | ICD-10-CM

## 2021-08-01 DIAGNOSIS — O24113 Pre-existing diabetes mellitus, type 2, in pregnancy, third trimester: Secondary | ICD-10-CM | POA: Diagnosis not present

## 2021-08-01 DIAGNOSIS — Z3A38 38 weeks gestation of pregnancy: Secondary | ICD-10-CM | POA: Diagnosis not present

## 2021-08-01 NOTE — Progress Notes (Signed)
HIGH-RISK PREGNANCY VISIT Patient name: Brittany Wade MRN [id number]  Date of birth: 26-Apr-2001 Chief Complaint:   Routine Prenatal Visit  History of Present Illness:   Brittany Wade is a 20 y.o. G67P0000 female at [redacted]w[redacted]d with an Estimated Date of Delivery: 08/10/21 being seen today for ongoing management of a high-risk pregnancy complicated by:  123456- sugars well controlled with current regimen.    Today she reports occasional contractions.   Contractions: Irritability. Vag. Bleeding: None.  Movement: Present. denies leaking of fluid.   Depression screen [facility] 2/9 05/22/2021 02/07/2021 02/07/2021 12/26/2020 10/04/2020  Decreased Interest 1 1 1  0 0  Down, Depressed, Hopeless 0 0 0 0 0  PHQ - 2 Score 1 1 1  0 0  Altered sleeping 1 1 1  0 -  Tired, decreased energy 0 1 1 1  -  Change in appetite 0 0 0 1 -  Feeling bad or failure about yourself  0 1 1 0 -  Trouble concentrating 0 0 0 0 -  Moving slowly or fidgety/restless 0 0 0 0 -  Suicidal thoughts 0 0 0 0 -  PHQ-9 Score 2 4 4 2  -  Difficult doing work/chores - - - - -     Current Outpatient Medications  Medication Instructions   Accu-Chek Softclix Lancets lancets Use as instructed to check blood sugar 4 times daily   Acetaminophen (TYLENOL PO) Oral   albuterol (VENTOLIN HFA) 108 (90 Base) MCG/ACT inhaler USE 2 PUFFS EVERY 6 HOURS AS NEEDED   aspirin 81 mg, Oral, Daily, Swallow whole.   atorvastatin (LIPITOR) 40 mg, Oral, Daily   Blood Pressure Monitor MISC For regular home bp monitoring during pregnancy   Ferrous Sulfate (IRON PO) Oral   glucose blood (ACCU-CHEK GUIDE) test strip Use as instructed to check blood sugar 4 times daily   insulin aspart (NOVOLOG) 2 Units, Subcutaneous, Daily with supper   Insulin Pen Needle (PEN NEEDLES) 32G X 4 MM MISC Use to inject insulin daily   Lantus SoloStar 18 Units, Subcutaneous, Daily   metFORMIN (GLUCOPHAGE) 1000 MG tablet TAKE ONE TABLET TWICE A DAY WITH MEALS.   Prenatal MV & Min w/FA-DHA  (PRENATAL GUMMIES PO) Oral     Review of Systems:   Pertinent items are noted in HPI Denies abnormal vaginal discharge w/ itching/odor/irritation, headaches, visual changes, shortness of breath, chest pain, abdominal pain, severe nausea/vomiting, or problems with urination or bowel movements unless otherwise stated above. Pertinent History Reviewed:  Reviewed past medical,surgical, social, obstetrical and family history.  Reviewed problem list, medications and allergies. Physical Assessment:   Vitals:   08/01/21 1358  BP: 130/84  Pulse: 84  Weight: 215 lb 6.4 oz (97.7 kg)  Body mass index is 33.74 kg/m.           Physical Examination:   General appearance: alert, well appearing, and in no distress  Mental status: normal mood, behavior, speech, dress, motor activity, and thought processes  Skin: warm & dry   Extremities: Edema: Trace    Cardiovascular: normal heart rate noted  Respiratory: normal respiratory effort, no distress  Abdomen: gravid, soft, non-tender  Pelvic: Cervical exam performed  Dilation: 2 Effacement (%): 20 Station: -3  Fetal Status:     Movement: Present Presentation: Vertex  Fetal Surveillance Testing today: BPP- cephalic,FHR 123456 bpm,posterior placenta gr 3,BPP 8/8,AFI 19.3 cm   Chaperone:  patient's mother     No results found for this or any previous visit (from the past 24 hour(s)).  Assessment & Plan:  High-risk pregnancy: G1P0000 at [redacted]w[redacted]d with an Estimated Date of Delivery: 08/10/21   1) T2DM- doing well with current insulin regimen -metformin 1000mg  bid -Lantus 18u qhs -Novolog 2 with dinner -Last growth 11/14- @ [redacted]w[redacted]d- 3963g,99%  IOL scheduled for Saturday   Labs/procedures today: BPP  Treatment Plan:  as outlined above, IOL scheduled  Reviewed: Term labor symptoms and general obstetric precautions including but not limited to vaginal bleeding, contractions, leaking of fluid and fetal movement were reviewed in detail with the patient.   All questions were answered. Pt has home bp cuff. Check bp weekly, let Wednesday know if >140/90.   Follow-up: Return in about 6 weeks (around 09/12/2021) for wk PP visit (keep NST on Thurs)).   Future Appointments  Date Time Provider Department Center  08/01/2021  2:50 PM 08/03/2021, DO [facility]  08/03/2021  9:30 AM [facility] NURSE [facility]  08/05/2021  7:15 AM [facility] SCHED ROOM [facility] None    No orders of the defined types were placed in this encounter.   14/11/2020, DO Attending Obstetrician & Gynecologist, [facility] for [facility]

## 2021-08-01 NOTE — Progress Notes (Signed)
[place] 38+5 wks,cephalic,FHR 146 bpm,posterior placenta gr 3,BPP 8/8,AFI 19.3 cm

## 2021-08-03 ENCOUNTER — Encounter: Payer: Self-pay | Admitting: Women's Health

## 2021-08-03 ENCOUNTER — Ambulatory Visit (INDEPENDENT_AMBULATORY_CARE_PROVIDER_SITE_OTHER): Payer: Medicaid Other | Admitting: *Deleted

## 2021-08-03 ENCOUNTER — Other Ambulatory Visit: Payer: Self-pay

## 2021-08-03 VITALS — BP 132/75 | HR 78 | Wt 213.6 lb

## 2021-08-03 DIAGNOSIS — O0993 Supervision of high risk pregnancy, unspecified, third trimester: Secondary | ICD-10-CM

## 2021-08-03 DIAGNOSIS — E118 Type 2 diabetes mellitus with unspecified complications: Secondary | ICD-10-CM

## 2021-08-03 LAB — POCT URINALYSIS DIPSTICK OB
Blood, UA: NEGATIVE
Glucose, UA: NEGATIVE
Ketones, UA: NEGATIVE
Leukocytes, UA: NEGATIVE
Nitrite, UA: NEGATIVE

## 2021-08-03 NOTE — Progress Notes (Addendum)
   NURSE VISIT- NST  SUBJECTIVE:  Brittany Wade is a 20 y.o. G1P0000 female at [redacted]w[redacted]d, here for a NST for pregnancy complicated by T2DM: Class C, currently on Metformin, insulin .  She reports active fetal movement, contractions: none, vaginal bleeding: none, membranes: intact.   OBJECTIVE:  BP 132/75   Pulse 78   Wt 213 lb 9.6 oz (96.9 kg)   LMP 11/03/2020 (Approximate)   BMI 33.45 kg/m   Appears well, no apparent distress  Results for orders placed or performed in visit on 08/03/21 (from the past 24 hour(s))  POC Urinalysis Dipstick OB   Collection Time: 08/03/21 10:24 AM  Result Value Ref Range   Color, UA     Clarity, UA     Glucose, UA Negative Negative   Bilirubin, UA     Ketones, UA neg    Spec Grav, UA     Blood, UA neg    pH, UA     POC,PROTEIN,UA Small (1+) Negative, Trace, Small (1+), Moderate (2+), Large (3+), 4+   Urobilinogen, UA     Nitrite, UA neg    Leukocytes, UA Negative Negative   Appearance     Odor      NST: FHR baseline 150 bpm, Variability: moderate, Accelerations:present, Decelerations:  Absent= Cat 1/reactive Toco: UI  ASSESSMENT: G1P0000 at [redacted]w[redacted]d with T2DM: Class C, currently on metformin and insulin NST reactive  PLAN: EFM strip reviewed by Dr. Charlotta Newton   Recommendations: keep next appointment as scheduled    Jobe Marker  08/03/2021 5:16 PM  Chart reviewed for nurse visit. Agree with plan of care.  Myna Hidalgo, DO 08/06/2021 8:54 PM

## 2021-08-04 ENCOUNTER — Other Ambulatory Visit: Payer: Self-pay | Admitting: Advanced Practice Midwife

## 2021-08-05 ENCOUNTER — Inpatient Hospital Stay (HOSPITAL_COMMUNITY): Payer: Medicaid Other

## 2021-08-05 ENCOUNTER — Inpatient Hospital Stay (HOSPITAL_COMMUNITY)
Admission: AD | Admit: 2021-08-05 | Discharge: 2021-08-10 | DRG: 787 | Disposition: A | Discharge status: Home / Self Care | Payer: Medicaid Other | Attending: Family Medicine | Admitting: Family Medicine

## 2021-08-05 ENCOUNTER — Encounter (HOSPITAL_COMMUNITY): Payer: Self-pay | Admitting: Obstetrics & Gynecology

## 2021-08-05 ENCOUNTER — Other Ambulatory Visit: Payer: Self-pay

## 2021-08-05 DIAGNOSIS — Z794 Long term (current) use of insulin: Secondary | ICD-10-CM | POA: Diagnosis not present

## 2021-08-05 DIAGNOSIS — O2412 Pre-existing diabetes mellitus, type 2, in childbirth: Secondary | ICD-10-CM | POA: Diagnosis present

## 2021-08-05 DIAGNOSIS — O139 Gestational [pregnancy-induced] hypertension without significant proteinuria, unspecified trimester: Secondary | ICD-10-CM

## 2021-08-05 DIAGNOSIS — F1290 Cannabis use, unspecified, uncomplicated: Secondary | ICD-10-CM | POA: Diagnosis present

## 2021-08-05 DIAGNOSIS — Z7982 Long term (current) use of aspirin: Secondary | ICD-10-CM | POA: Diagnosis not present

## 2021-08-05 DIAGNOSIS — E119 Type 2 diabetes mellitus without complications: Secondary | ICD-10-CM | POA: Diagnosis present

## 2021-08-05 DIAGNOSIS — R87619 Unspecified abnormal cytological findings in specimens from cervix uteri: Secondary | ICD-10-CM | POA: Diagnosis present

## 2021-08-05 DIAGNOSIS — O3663X1 Maternal care for excessive fetal growth, third trimester, fetus 1: Secondary | ICD-10-CM | POA: Diagnosis not present

## 2021-08-05 DIAGNOSIS — Z91041 Radiographic dye allergy status: Secondary | ICD-10-CM | POA: Diagnosis not present

## 2021-08-05 DIAGNOSIS — O409XX0 Polyhydramnios, unspecified trimester, not applicable or unspecified: Secondary | ICD-10-CM | POA: Diagnosis present

## 2021-08-05 DIAGNOSIS — D62 Acute posthemorrhagic anemia: Secondary | ICD-10-CM | POA: Diagnosis not present

## 2021-08-05 DIAGNOSIS — O3663X0 Maternal care for excessive fetal growth, third trimester, not applicable or unspecified: Secondary | ICD-10-CM | POA: Diagnosis present

## 2021-08-05 DIAGNOSIS — Z3A39 39 weeks gestation of pregnancy: Secondary | ICD-10-CM | POA: Diagnosis not present

## 2021-08-05 DIAGNOSIS — O0990 Supervision of high risk pregnancy, unspecified, unspecified trimester: Secondary | ICD-10-CM

## 2021-08-05 DIAGNOSIS — Z20822 Contact with and (suspected) exposure to covid-19: Secondary | ICD-10-CM | POA: Diagnosis present

## 2021-08-05 DIAGNOSIS — O9081 Anemia of the puerperium: Secondary | ICD-10-CM | POA: Diagnosis not present

## 2021-08-05 DIAGNOSIS — O99324 Drug use complicating childbirth: Secondary | ICD-10-CM | POA: Diagnosis present

## 2021-08-05 DIAGNOSIS — O134 Gestational [pregnancy-induced] hypertension without significant proteinuria, complicating childbirth: Secondary | ICD-10-CM | POA: Diagnosis present

## 2021-08-05 DIAGNOSIS — Z87891 Personal history of nicotine dependence: Secondary | ICD-10-CM

## 2021-08-05 DIAGNOSIS — O24319 Unspecified pre-existing diabetes mellitus in pregnancy, unspecified trimester: Secondary | ICD-10-CM | POA: Diagnosis present

## 2021-08-05 DIAGNOSIS — Z98891 History of uterine scar from previous surgery: Secondary | ICD-10-CM

## 2021-08-05 MED ORDER — TERBUTALINE SULFATE 1 MG/ML IJ SOLN: 0.2500 mg | Freq: Once | INTRAMUSCULAR | Status: DC | PRN

## 2021-08-05 MED ORDER — INSULIN ASPART 100 UNIT/ML IJ SOLN
0.0000 [IU] | INTRAMUSCULAR | Status: DC
  Administered 2021-08-06: 05:00:00 1 [IU] via SUBCUTANEOUS

## 2021-08-05 MED ORDER — LIDOCAINE HCL (PF) 1 % IJ SOLN: 30.0000 mL | INTRAMUSCULAR | Status: DC | PRN

## 2021-08-05 MED ORDER — MISOPROSTOL 50MCG HALF TABLET: 50.0000 ug | ORAL_TABLET | ORAL | Status: DC | PRN

## 2021-08-05 MED ORDER — OXYTOCIN-SODIUM CHLORIDE 30-0.9 UT/500ML-% IV SOLN
1.0000 m[IU]/min | INTRAVENOUS | Status: DC
  Administered 2021-08-06: 2 m[IU]/min via INTRAVENOUS

## 2021-08-05 MED ORDER — INSULIN NPH (HUMAN) (ISOPHANE) 100 UNIT/ML ~~LOC~~ SUSP: 10.0000 [IU] | Freq: Two times a day (BID) | SUBCUTANEOUS | Status: DC

## 2021-08-05 MED ORDER — LACTATED RINGERS IV SOLN
500.0000 mL | INTRAVENOUS | Status: DC | PRN
  Administered 2021-08-06: 750 mL via INTRAVENOUS

## 2021-08-05 MED ORDER — SOD CITRATE-CITRIC ACID 500-334 MG/5ML PO SOLN: 30.0000 mL | ORAL | Status: DC | PRN

## 2021-08-05 MED ORDER — OXYTOCIN-SODIUM CHLORIDE 30-0.9 UT/500ML-% IV SOLN
2.5000 [IU]/h | INTRAVENOUS | Status: DC
  Filled 2021-08-05: qty 500

## 2021-08-05 MED ORDER — FENTANYL CITRATE (PF) 100 MCG/2ML IJ SOLN
50.0000 ug | INTRAMUSCULAR | Status: DC | PRN
  Administered 2021-08-06 (×4): 100 ug via INTRAVENOUS
  Filled 2021-08-05 (×4): qty 2

## 2021-08-05 MED ORDER — ACETAMINOPHEN 325 MG PO TABS: 650.0000 mg | ORAL_TABLET | ORAL | Status: DC | PRN

## 2021-08-05 MED ORDER — OXYTOCIN BOLUS FROM INFUSION: 333.0000 mL | Freq: Once | INTRAVENOUS | Status: DC

## 2021-08-05 MED ORDER — LACTATED RINGERS IV SOLN: INTRAVENOUS | Status: DC

## 2021-08-05 NOTE — H&P (Signed)
Obstetric Attending History and Physical  Francely Craw Minkoff is a 20 y.o. G1P0000 with IUP at [redacted]w[redacted]d presenting for IOL for T2DM, insulin controlled. Patient states she has been having  irregular contractions,  no  vaginal bleeding, intact membranes, with active fetal movement.    Prenatal Course [facility] of Care: [facility]  with onset of care at 13 weeks Pregnancy complications or risks: Patient Active Problem List   Diagnosis Date Noted   Type 2 diabetes mellitus without complication, with long-term current use of insulin (HCC) 08/05/2021   Preexisting diabetes complicating pregnancy, antepartum 08/05/2021   Large for gestational age fetus affecting management of mother, antepartum, third trimester 08/05/2021   Abnormal Pap smear of cervix 02/13/2021   Marijuana use 02/08/2021   Supervision of high-risk pregnancy 02/07/2021   Hyperlipidemia associated with type 2 diabetes mellitus (HCC) 10/04/2020   Hidradenitis suppurativa 09/30/2018   Gastroesophageal reflux disease without esophagitis 05/26/2018   Anxiety state 04/04/2015   Panic attack 04/04/2015   Last ultrasound on 07/17/21 at [redacted]w[redacted]d showed EFW 3963g/99.6%, AC 99%, posterior placenta, normal AFI   [facility]  RESULTS  Language English Pap 02/07/21: LSIL  Initiated care at 13wks GC/CT Initial:  -/-          36wks: -/-  Dating by LMP c/w 9wk U/S    Support person  Genetics NT/IT:no u/s available     AFP:     neg Panorama: low risk FEMALE  BP cuff rx'd 6/7 Carrier Screen neg    Vining/Hgb Elec neg  Rhogam n/a    TDaP vaccine 08/05/21  Blood Type A/Positive/-- (06/07 1511)  Flu vaccine declined Antibody Negative (09/19 1334)  Covid vaccine  HBsAg Negative (06/07 1511)    RPR Non Reactive (09/19 1334)  Anatomy US Normal female '[place]ox' Rubella  1.43 (06/07 1511)  Feeding Plan breast HIV Non Reactive (09/19 1334)  Contraception phexxi/condoms Hep C neg  Circumcision yes    Pediatrician List given A1C/GTT Early: 6.3     26-28wks:   Prenatal Classes discussed      GBS Negative/-- (11/14 0000)     Past Medical History:  Diagnosis Date   Circadian rhythm disorder 04/04/2015   Constipation    Diabetes mellitus without complication (HCC)    Migraine 05/21/2018   Tremor 04/04/2015    Past Surgical History:  Procedure Laterality Date   ADENOIDECTOMY     MOUTH SURGERY     TONSILLECTOMY      OB History  Gravida Para Term Preterm AB Living  1 0 0 0 0 0  SAB IAB Ectopic Multiple Live Births  0 0 0 0 0    # Outcome Date GA Lbr Len/2nd Weight Sex Delivery Anes PTL Lv  1 Current             Social History   Socioeconomic History   Marital status: Significant Other    Spouse name: Not on file   Number of children: Not on file   Years of education: Not on file   Highest education level: Not on file  Occupational History   Not on file  Tobacco Use   Smoking status: Former    Packs/day: 0.50    Years: 4.00    Pack years: 2.00    Types: Cigarettes   Smokeless tobacco: Never  Vaping Use   Vaping Use: Every day  Substance and Sexual Activity   Alcohol use: No   Drug use: No   Sexual activity:  Yes    Birth control/protection: None  Other Topics Concern   Not on file  Social History Narrative   Not on file   Social Determinants of Health   Financial Resource Strain: Low Risk    Difficulty of Paying Living Expenses: Not very hard  Food Insecurity: Food Insecurity Present   Worried About Running Out of Food in the Last Year: Sometimes true   Ran Out of Food in the Last Year: Never true  Transportation Needs: No Transportation Needs   Lack of Transportation (Medical): No   Lack of Transportation (Non-Medical): No  Physical Activity: Insufficiently Active   Days of Exercise per Week: 2 days   Minutes of Exercise per Session: 60 min  Stress: No Stress Concern Present   Feeling of Stress : Only a little  Social Connections: Moderately Isolated   Frequency of Communication with Friends and Family: Three  times a week   Frequency of Social Gatherings with Friends and Family: Three times a week   Attends Religious Services: Never   Active Member of Clubs or Organizations: No   Attends Archivist Meetings: Never   Marital Status: Living with partner    Family History  Problem Relation Age of Onset   Migraines Mother    Mental illness Mother    Bipolar disorder Mother    Depression Mother    Anxiety disorder Mother    Seizures Mother    Heart disease Mother    Diabetes Mother    Asperger's syndrome Sister    ADD / ADHD Sister    Migraines Maternal Grandmother    Depression Maternal Grandmother    Anxiety disorder Maternal Grandmother    Epilepsy Other        maternal aunt   Diabetes Other    Heart disease Other    ADD / ADHD Cousin     Medications Prior to Admission  Medication Sig Dispense Refill Last Dose   Accu-Chek Softclix Lancets lancets Use as instructed to check blood sugar 4 times daily 100 each 12    Acetaminophen (TYLENOL PO) Take by mouth.      albuterol (VENTOLIN HFA) 108 (90 Base) MCG/ACT inhaler USE 2 PUFFS EVERY 6 HOURS AS NEEDED 8.5 g 1    aspirin 81 MG EC tablet Take 1 tablet (81 mg total) by mouth daily. Swallow whole. 90 tablet 3    atorvastatin (LIPITOR) 40 MG tablet Take 40 mg by mouth daily.      Blood Pressure Monitor MISC For regular home bp monitoring during pregnancy 1 each 0    Ferrous Sulfate (IRON PO) Take by mouth.      glucose blood (ACCU-CHEK GUIDE) test strip Use as instructed to check blood sugar 4 times daily 50 each 12    insulin aspart (NOVOLOG) 100 UNIT/ML injection Inject 2 Units into the skin daily with supper. 10 mL 0    insulin glargine (LANTUS SOLOSTAR) 100 UNIT/ML Solostar Pen Inject 18 Units into the skin daily. 15 mL 11    Insulin Pen Needle (PEN NEEDLES) 32G X 4 MM MISC Use to inject insulin daily 100 each 11    metFORMIN (GLUCOPHAGE) 1000 MG tablet TAKE ONE TABLET TWICE A DAY WITH MEALS. 180 tablet 1    Prenatal MV &  Min w/FA-DHA (PRENATAL GUMMIES PO) Take by mouth.       Allergies  Allergen Reactions   Red Dye Hives and Rash   Codeine Hives    Review of Systems: Negative  except for what is mentioned in HPI.  Physical Exam: BP 136/82   Pulse 94   Resp 18   LMP 11/03/2020 (Approximate)  CONSTITUTIONAL: Well-developed, well-nourished female in no acute distress.  HENT:  Normocephalic, atraumatic, External right and left ear normal. Oropharynx is clear and moist EYES: Conjunctivae and EOM are normal. Pupils are equal, round, and reactive to light. No scleral icterus.  NECK: Normal range of motion, supple, no masses SKIN: Skin is warm and dry. No rash noted. Not diaphoretic. No erythema. No pallor. NEUROLOGIC: Alert and oriented to person, place, and time. Normal reflexes, muscle tone coordination. No cranial nerve deficit noted. PSYCHIATRIC: Normal mood and affect. Normal behavior. Normal judgment and thought content. CARDIOVASCULAR: Normal heart rate noted, regular rhythm RESPIRATORY: Effort and breath sounds normal, no problems with respiration noted ABDOMEN: Soft, nontender, nondistended, gravid. MUSCULOSKELETAL: Normal range of motion. No edema and no tenderness. 2+ distal pulses.  Cervical Exam: Dilatation 2-3 cm   Effacement 70%   Station -2   Presentation: cephalic FHT:  Baseline rate 150 bpm   Variability moderate  Accelerations present   Decelerations none Contractions: Every 3-4 mins   Pertinent Labs/Studies:   No results found for this or any previous visit (from the past 24 hour(s)).   Assessment : ATHZIRY WYNNE is a 20 y.o. G1P0000 at [redacted]w[redacted]d being admitted for induction of labor due to T2DM  Plan: Labor: Induction ordered as per protocol. Analgesia as needed. T2DM: Will check CBGs q4h for now, then more often in active labor. Sensitive sliding scale ordered.  FWB: Reassuring fetal heart tracing, Category 1.  GBS negative Delivery plan: Concerned about IOL for this LGA fetus,  EFW and AC>99% (extrapolated fetal weight is almost at 4500 g).  Talked to patient about risks of shoulder dystocia, increased risk of morbidity/mortality to fetus and patient. Also emphasized that operative vaginal delivery will not be done for this fetus.  Patient wants to proceed with IOL, wants to avoid cesarean section if she can. Understands that this will be indicated for the routine maternal-fetal reasons for cesarean section, and in her case for arrest of cervical dilation or fetal descent. Will watch closely. RN is aware for need for shoulder dystocia preparations at time of delivery.Hopeful for vaginal delivery    Verita Schneiders, MD, [place], [facility] for [employer], [place]

## 2021-08-06 ENCOUNTER — Inpatient Hospital Stay (HOSPITAL_COMMUNITY): Payer: Medicaid Other | Admitting: Anesthesiology

## 2021-08-06 DIAGNOSIS — O139 Gestational [pregnancy-induced] hypertension without significant proteinuria, unspecified trimester: Secondary | ICD-10-CM

## 2021-08-06 LAB — CBC
HCT: 26 % — ABNORMAL LOW (ref 36.0–46.0)
HCT: 25.7 % — ABNORMAL LOW (ref 36.0–46.0)
Hemoglobin: 7.5 g/dL — ABNORMAL LOW (ref 12.0–15.0)
Hemoglobin: 7.5 g/dL — ABNORMAL LOW (ref 12.0–15.0)
MCH: 22.6 pg — ABNORMAL LOW (ref 26.0–34.0)
MCH: 22.9 pg — ABNORMAL LOW (ref 26.0–34.0)
MCHC: 28.8 g/dL — ABNORMAL LOW (ref 30.0–36.0)
MCHC: 29.2 g/dL — ABNORMAL LOW (ref 30.0–36.0)
MCV: 78.3 fL — ABNORMAL LOW (ref 80.0–100.0)
MCV: 78.4 fL — ABNORMAL LOW (ref 80.0–100.0)
Platelets: 309 10*3/uL (ref 150–400)
Platelets: 266 10*3/uL (ref 150–400)
RBC: 3.32 MIL/uL — ABNORMAL LOW (ref 3.87–5.11)
RBC: 3.28 MIL/uL — ABNORMAL LOW (ref 3.87–5.11)
RDW: 20 % — ABNORMAL HIGH (ref 11.5–15.5)
RDW: 20.5 % — ABNORMAL HIGH (ref 11.5–15.5)
WBC: 20.2 10*3/uL — ABNORMAL HIGH (ref 4.0–10.5)
WBC: 17.4 10*3/uL — ABNORMAL HIGH (ref 4.0–10.5)
nRBC: 0.1 % (ref 0.0–0.2)
nRBC: 0.1 % (ref 0.0–0.2)

## 2021-08-06 LAB — COMPREHENSIVE METABOLIC PANEL
ALT: 9 U/L (ref 0–44)
AST: 16 U/L (ref 15–41)
Albumin: 2.2 g/dL — ABNORMAL LOW (ref 3.5–5.0)
Alkaline Phosphatase: 122 U/L (ref 38–126)
Anion gap: 8 (ref 5–15)
BUN: 8 mg/dL (ref 6–20)
CO2: 21 mmol/L — ABNORMAL LOW (ref 22–32)
Calcium: 8.4 mg/dL — ABNORMAL LOW (ref 8.9–10.3)
Chloride: 107 mmol/L (ref 98–111)
Creatinine, Ser: 0.63 mg/dL (ref 0.44–1.00)
GFR, Estimated: >60 mL/min (ref >60)
Glucose, Bld: 87 mg/dL (ref 70–99)
Potassium: 4 mmol/L (ref 3.5–5.1)
Sodium: 136 mmol/L (ref 135–145)
Total Bilirubin: <0.1 mg/dL — ABNORMAL LOW (ref 0.3–1.2)
Total Protein: 5.1 g/dL — ABNORMAL LOW (ref 6.5–8.1)

## 2021-08-06 LAB — GLUCOSE, CAPILLARY
Glucose-Capillary: 72 mg/dL (ref 70–99)
Glucose-Capillary: 74 mg/dL (ref 70–99)
Glucose-Capillary: 79 mg/dL (ref 70–99)
Glucose-Capillary: 82 mg/dL (ref 70–99)
Glucose-Capillary: 87 mg/dL (ref 70–99)
Glucose-Capillary: 94 mg/dL (ref 70–99)
Glucose-Capillary: 97 mg/dL (ref 70–99)

## 2021-08-06 LAB — RESP PANEL BY RT-PCR (FLU A&B, COVID) ARPGX2
Influenza A by PCR: NEGATIVE
Influenza B by PCR: NEGATIVE
SARS Coronavirus 2 by RT PCR: NEGATIVE

## 2021-08-06 LAB — RPR: RPR Ser Ql: NONREACTIVE

## 2021-08-06 MED ORDER — METFORMIN HCL 500 MG PO TABS
1000.0000 mg | ORAL_TABLET | Freq: Two times a day (BID) | ORAL | Status: DC
  Administered 2021-08-06 – 2021-08-10 (×8): 1000 mg via ORAL
  Filled 2021-08-06 (×10): qty 2

## 2021-08-06 MED ORDER — ONDANSETRON HCL 4 MG/2ML IJ SOLN
INTRAMUSCULAR | Status: AC
  Administered 2021-08-06: 18:00:00 4 mg
  Filled 2021-08-06: qty 2

## 2021-08-06 MED ORDER — ONDANSETRON HCL 4 MG/2ML IJ SOLN: 4.0000 mg | Freq: Once | INTRAMUSCULAR | Status: DC

## 2021-08-06 MED ORDER — LACTATED RINGERS IV SOLN: 500.0000 mL | Freq: Once | INTRAVENOUS | Status: DC

## 2021-08-06 MED ORDER — LIDOCAINE HCL (PF) 1 % IJ SOLN
INTRAMUSCULAR | Status: DC | PRN
  Administered 2021-08-06 (×2): 5 mL via EPIDURAL

## 2021-08-06 MED ORDER — FENTANYL-BUPIVACAINE-NACL 0.5-0.125-0.9 MG/250ML-% EP SOLN
EPIDURAL | Status: AC
  Filled 2021-08-06: qty 250

## 2021-08-06 MED ORDER — FENTANYL-BUPIVACAINE-NACL 0.5-0.125-0.9 MG/250ML-% EP SOLN
EPIDURAL | Status: DC | PRN
  Administered 2021-08-06: 12 mL/h via EPIDURAL

## 2021-08-06 NOTE — Progress Notes (Signed)
Labor Progress Note Brittany Wade is a 20 y.o. G1P0000 at [redacted]w[redacted]d presented for IOL for T2DM  S:  Patient comfortable with epidural  O:  BP 128/63   Pulse 68   Temp 98.2 F (36.8 C) (Oral)   Resp 18   Ht 5\' 7"  (1.702 m)   Wt 97.7 kg   LMP 11/03/2020 (Approximate)   SpO2 100%   BMI 33.74 kg/m   Fetal Tracing:  Baseline: 150 Variability: moderate Accels: 15x15 Decels: variable  Toco: 2-5   CVE: Dilation: 6 Effacement (%): 100 Cervical Position: Middle Station: 0 Presentation: Vertex Exam by:: [email or web address], CNM   A&P: 20 y.o. G1P0000 [redacted]w[redacted]d IOL T2DM #Labor: Cervix unchanged. Discussed risks and benefits of IUPC placement for monitoring contractions more accurately. Patient agreeable to plan of care. Internal monitor placed without difficulty and patient tolerated procedure well.   #Pain: epidural #FWB: Cat 1 #GBS negative  [redacted]w[redacted]d, CNM 6:56 PM

## 2021-08-06 NOTE — Anesthesia Preprocedure Evaluation (Signed)
Anesthesia Evaluation  Patient identified by MRN, date of birth, ID band Patient awake    Reviewed: Allergy & Precautions, H&P , NPO status , Patient's Chart, lab work & pertinent test results  Airway Mallampati: II   Neck ROM: full    Dental   Pulmonary former smoker,    breath sounds clear to auscultation       Cardiovascular hypertension,  Rhythm:regular Rate:Normal     Neuro/Psych  Headaches, PSYCHIATRIC DISORDERS Anxiety    GI/Hepatic GERD  ,  Endo/Other  diabetes, Type 2  Renal/GU      Musculoskeletal   Abdominal   Peds  Hematology  (+) anemia ,   Anesthesia Other Findings   Reproductive/Obstetrics (+) Pregnancy                             Anesthesia Physical Anesthesia Plan  ASA: 2  Anesthesia Plan: Epidural   Post-op Pain Management:    Induction: Intravenous  PONV Risk Score and Plan: 2 and Treatment may vary due to age or medical condition  Airway Management Planned: Natural Airway  Additional Equipment:   Intra-op Plan:   Post-operative Plan:   Informed Consent: I have reviewed the patients History and Physical, chart, labs and discussed the procedure including the risks, benefits and alternatives for the proposed anesthesia with the patient or authorized representative who has indicated his/her understanding and acceptance.     Dental advisory given  Plan Discussed with: Anesthesiologist  Anesthesia Plan Comments:         Anesthesia Quick Evaluation

## 2021-08-06 NOTE — Progress Notes (Signed)
Labor Progress Note Brittany Wade is a 20 y.o. G1P0000 at [redacted]w[redacted]d who presented for IOL due to insulin controlled T2DM.  S: Doing well. No concerns. Resting comfortably.   O:  BP 129/64   Pulse (!) 57   Resp 18   Ht 5\' 7"  (1.702 m)   Wt 97.7 kg   LMP 11/03/2020 (Approximate)   BMI 33.74 kg/m   EFM: Baseline 150 bpm, moderate variability, + accels, no decels  CVE: Dilation: 2.5 Effacement (%): 60, 70 Cervical Position: Middle Station: -2 Presentation: Vertex Exam by:: [email or web address], RN   A&P: 20 y.o. G1P0000 [redacted]w[redacted]d   #Labor: No significant cervical change on prior check. Discussed foley balloon placement. Patient would like to wait for now but is open to trying it a little later this morning. Requesting pain medicine before attempt. Will reassess in an hour or so and discuss with patient at that time.  #Pain: PRN; IV pain medicine for now  #FWB: Cat 1  #GBS negative  #T2DM: CBGs normal range since admission. Will continue home Metformin 1000 mg BID and SSI with q4hr glucose checks.  [redacted]w[redacted]d, MD 7:26 AM

## 2021-08-06 NOTE — Progress Notes (Signed)
Labor Progress Note Brittany Wade is a 20 y.o. G1P0000 at [redacted]w[redacted]d presented for IOL for T2DM on insulin  S:  Patient reports pain is "very bad" but is coping well  O:  BP (!) 143/80   Pulse 69   Temp 98.2 F (36.8 C) (Oral)   Resp 18   Ht 5\' 7"  (1.702 m)   Wt 97.7 kg   LMP 11/03/2020 (Approximate)   BMI 33.74 kg/m   Fetal Tracing:  Baseline: 145 Variability: moderate Accels: 15x15 Decels: none  Toco: 2-4   CVE: Dilation: 6 Effacement (%): 100 Cervical Position: Middle Station: 0 Presentation: Vertex Exam by:: [email or web address]   A&P: 20 y.o. G1P0000 [redacted]w[redacted]d IOL T2DM #Labor: Progressing well. Discussed risks and benefits of AROM with patient. Patient desires to think about it and will let CNM know. Encouraged patient regarding great progress in labor so far and great coping #Pain: IV fentanyl, discussed that we are nearing window where she can no longer get IV pain medication. Patient verbalized understanding #FWB: Cat 1 #GBS negative  [redacted]w[redacted]d, CNM 2:12 PM

## 2021-08-06 NOTE — Progress Notes (Signed)
72 BS noted; 52mL apple juice given; notified Dr Mathis Fare; per MD okay to allow pt to eat small piece of cheese with metformin; will continue to assess BS q 4hrs

## 2021-08-06 NOTE — Progress Notes (Signed)
Labor Progress Note Brittany Wade is a 20 y.o. G1P0000 at [redacted]w[redacted]d presented for IOL for T1DM on insulin  S:  Patient comfortable with epidural  O:  BP 138/78   Pulse 66   Temp 98.2 F (36.8 C) (Oral)   Resp 18   Ht 5\' 7"  (1.702 m)   Wt 97.7 kg   LMP 11/03/2020 (Approximate)   SpO2 100%   BMI 33.74 kg/m   Fetal Tracing:  Baseline: 150 Variability: moderate Accels: 15x15 Decels: none  Toco: 1-4  CVE: Dilation: 6 Effacement (%): 100 Cervical Position: Middle Station: 0 Presentation: Vertex Exam by:: [email or web address], CNM   A&P: 20 y.o. G1P0000 [redacted]w[redacted]d IOL T2DM #Labor: Progressing well. Discussed with patient risks and benefits of AROM for augmentation of labor. Patient agreeable to plan of care. AROM with small amount of clear fluid. Patient and FHR tolerated procedure well. Continue to titrate pitocin as needed #Pain: epidural #FWB: Cat 1 #GBS negative   [redacted]w[redacted]d, CNM 5:02 PM

## 2021-08-06 NOTE — Progress Notes (Addendum)
Labor Progress Note Brittany Wade is a 20 y.o. G1P0000 at [redacted]w[redacted]d presented for IOL for T2DM  S:  Patient very uncomfortable with contractions  O:  BP (!) 127/52   Pulse 83   Temp 98.4 F (36.9 C) (Oral)   Resp 18   Ht 5\' 7"  (1.702 m)   Wt 97.7 kg   LMP 11/03/2020 (Approximate)   BMI 33.74 kg/m   Fetal Tracing:  Baseline: 140 Variability: moderate Accels: 15x15 Decels: none  Toco: 2-4   CVE: Dilation: 4 Effacement (%): 100 Cervical Position: Middle Station: -2 Presentation: Vertex Exam by:: [email or web address], CNM   A&P: 20 y.o. G1P0000 [redacted]w[redacted]d IOL T2DM #Labor: Progressing well. Continue pitocin and consider AROM with next exam  Several elevated BPs since admission. Now meets criteria for gHTN. Will check preeclampsia labs  #Pain: IV fentanyl #FWB: Cat 1 #GBS negative  [redacted]w[redacted]d, CNM 9:29 AM

## 2021-08-07 ENCOUNTER — Encounter (HOSPITAL_COMMUNITY)
Admission: AD | Disposition: A | Discharge status: Home / Self Care | Payer: Self-pay | Source: Home / Self Care | Attending: Family Medicine

## 2021-08-07 ENCOUNTER — Encounter (HOSPITAL_COMMUNITY): Payer: Self-pay | Admitting: Obstetrics & Gynecology

## 2021-08-07 DIAGNOSIS — O3663X1 Maternal care for excessive fetal growth, third trimester, fetus 1: Secondary | ICD-10-CM | POA: Diagnosis not present

## 2021-08-07 DIAGNOSIS — Z3A39 39 weeks gestation of pregnancy: Secondary | ICD-10-CM | POA: Diagnosis not present

## 2021-08-07 DIAGNOSIS — Z98891 History of uterine scar from previous surgery: Secondary | ICD-10-CM

## 2021-08-07 DIAGNOSIS — O134 Gestational [pregnancy-induced] hypertension without significant proteinuria, complicating childbirth: Secondary | ICD-10-CM | POA: Diagnosis not present

## 2021-08-07 DIAGNOSIS — O2412 Pre-existing diabetes mellitus, type 2, in childbirth: Secondary | ICD-10-CM | POA: Diagnosis not present

## 2021-08-07 LAB — GLUCOSE, CAPILLARY
Glucose-Capillary: 136 mg/dL — ABNORMAL HIGH (ref 70–99)
Glucose-Capillary: 136 mg/dL — ABNORMAL HIGH (ref 70–99)
Glucose-Capillary: 137 mg/dL — ABNORMAL HIGH (ref 70–99)
Glucose-Capillary: 200 mg/dL — ABNORMAL HIGH (ref 70–99)
Glucose-Capillary: 90 mg/dL (ref 70–99)

## 2021-08-07 SURGERY — Surgical Case
Anesthesia: Epidural | Wound class: Clean Contaminated

## 2021-08-07 MED ORDER — DIBUCAINE (PERIANAL) 1 % EX OINT: 1.0000 "application " | TOPICAL_OINTMENT | CUTANEOUS | Status: DC | PRN

## 2021-08-07 MED ORDER — OXYCODONE HCL 5 MG/5ML PO SOLN: 5.0000 mg | Freq: Once | ORAL | Status: DC | PRN

## 2021-08-07 MED ORDER — SODIUM CHLORIDE 0.9 % IV SOLN
500.0000 mg | Freq: Once | INTRAVENOUS | Status: AC
  Administered 2021-08-07: 500 mg via INTRAVENOUS

## 2021-08-07 MED ORDER — ACETAMINOPHEN 10 MG/ML IV SOLN
INTRAVENOUS | Status: DC | PRN
  Administered 2021-08-07: 1000 mg via INTRAVENOUS

## 2021-08-07 MED ORDER — INSULIN ASPART 100 UNIT/ML IJ SOLN
0.0000 [IU] | INTRAMUSCULAR | Status: DC
  Administered 2021-08-07: 2 [IU] via SUBCUTANEOUS

## 2021-08-07 MED ORDER — OXYTOCIN-SODIUM CHLORIDE 30-0.9 UT/500ML-% IV SOLN
INTRAVENOUS | Status: DC | PRN
  Administered 2021-08-07: 400 mL via INTRAVENOUS

## 2021-08-07 MED ORDER — IBUPROFEN 600 MG PO TABS
600.0000 mg | ORAL_TABLET | Freq: Four times a day (QID) | ORAL | Status: DC
  Administered 2021-08-08 – 2021-08-10 (×9): 600 mg via ORAL
  Filled 2021-08-07 (×9): qty 1

## 2021-08-07 MED ORDER — SODIUM CHLORIDE 0.9% FLUSH: 3.0000 mL | INTRAVENOUS | Status: DC | PRN

## 2021-08-07 MED ORDER — FENTANYL CITRATE (PF) 250 MCG/5ML IJ SOLN
INTRAMUSCULAR | Status: DC | PRN
  Administered 2021-08-07 (×5): 50 ug via INTRAVENOUS

## 2021-08-07 MED ORDER — TRANEXAMIC ACID-NACL 1000-0.7 MG/100ML-% IV SOLN
INTRAVENOUS | Status: DC | PRN
  Administered 2021-08-07: 1000 mg via INTRAVENOUS

## 2021-08-07 MED ORDER — OXYTOCIN-SODIUM CHLORIDE 30-0.9 UT/500ML-% IV SOLN
INTRAVENOUS | Status: AC
  Filled 2021-08-07: qty 500

## 2021-08-07 MED ORDER — FENTANYL CITRATE (PF) 250 MCG/5ML IJ SOLN
INTRAMUSCULAR | Status: AC
  Filled 2021-08-07: qty 5

## 2021-08-07 MED ORDER — DIPHENHYDRAMINE HCL 25 MG PO CAPS: 25.0000 mg | ORAL_CAPSULE | ORAL | Status: DC | PRN

## 2021-08-07 MED ORDER — CEFAZOLIN SODIUM-DEXTROSE 2-3 GM-%(50ML) IV SOLR
INTRAVENOUS | Status: DC | PRN
  Administered 2021-08-07: 2 g via INTRAVENOUS

## 2021-08-07 MED ORDER — ENOXAPARIN SODIUM 40 MG/0.4ML IJ SOSY
40.0000 mg | PREFILLED_SYRINGE | INTRAMUSCULAR | Status: DC
  Administered 2021-08-08 – 2021-08-09 (×3): 40 mg via SUBCUTANEOUS
  Filled 2021-08-07 (×3): qty 0.4

## 2021-08-07 MED ORDER — TETANUS-DIPHTH-ACELL PERTUSSIS 5-2.5-18.5 LF-MCG/0.5 IM SUSY: 0.5000 mL | PREFILLED_SYRINGE | Freq: Once | INTRAMUSCULAR | Status: DC

## 2021-08-07 MED ORDER — MEPERIDINE HCL 25 MG/ML IJ SOLN: 6.2500 mg | INTRAMUSCULAR | Status: DC | PRN

## 2021-08-07 MED ORDER — DEXAMETHASONE SODIUM PHOSPHATE 4 MG/ML IJ SOLN
INTRAMUSCULAR | Status: DC | PRN
  Administered 2021-08-07: 4 mg via INTRAVENOUS

## 2021-08-07 MED ORDER — WITCH HAZEL-GLYCERIN EX PADS: 1.0000 "application " | MEDICATED_PAD | CUTANEOUS | Status: DC | PRN

## 2021-08-07 MED ORDER — ONDANSETRON HCL 4 MG/2ML IJ SOLN
INTRAMUSCULAR | Status: AC
  Filled 2021-08-07: qty 4

## 2021-08-07 MED ORDER — MENTHOL 3 MG MT LOZG: 1.0000 | LOZENGE | OROMUCOSAL | Status: DC | PRN

## 2021-08-07 MED ORDER — KETOROLAC TROMETHAMINE 30 MG/ML IJ SOLN: 30.0000 mg | Freq: Four times a day (QID) | INTRAMUSCULAR | Status: AC | PRN

## 2021-08-07 MED ORDER — FENTANYL CITRATE (PF) 100 MCG/2ML IJ SOLN
INTRAMUSCULAR | Status: DC | PRN
  Administered 2021-08-07: 100 ug via EPIDURAL

## 2021-08-07 MED ORDER — DEXMEDETOMIDINE (PRECEDEX) IN NS 20 MCG/5ML (4 MCG/ML) IV SYRINGE
PREFILLED_SYRINGE | INTRAVENOUS | Status: AC
  Filled 2021-08-07: qty 5

## 2021-08-07 MED ORDER — COCONUT OIL OIL: 1.0000 "application " | TOPICAL_OIL | Status: DC | PRN

## 2021-08-07 MED ORDER — KETOROLAC TROMETHAMINE 30 MG/ML IJ SOLN
30.0000 mg | Freq: Four times a day (QID) | INTRAMUSCULAR | Status: AC
  Administered 2021-08-07 – 2021-08-08 (×4): 30 mg via INTRAVENOUS
  Filled 2021-08-07 (×4): qty 1

## 2021-08-07 MED ORDER — ONDANSETRON HCL 4 MG/2ML IJ SOLN
INTRAMUSCULAR | Status: AC
  Filled 2021-08-07: qty 2

## 2021-08-07 MED ORDER — OXYTOCIN-SODIUM CHLORIDE 30-0.9 UT/500ML-% IV SOLN: INTRAVENOUS | Status: DC | PRN

## 2021-08-07 MED ORDER — SCOPOLAMINE 1 MG/3DAYS TD PT72
MEDICATED_PATCH | TRANSDERMAL | Status: DC | PRN
  Administered 2021-08-07: 1 via TRANSDERMAL

## 2021-08-07 MED ORDER — LIDOCAINE-EPINEPHRINE (PF) 2 %-1:200000 IJ SOLN
INTRAMUSCULAR | Status: AC
  Filled 2021-08-07: qty 20

## 2021-08-07 MED ORDER — OXYTOCIN-SODIUM CHLORIDE 30-0.9 UT/500ML-% IV SOLN: 2.5000 [IU]/h | INTRAVENOUS | Status: AC

## 2021-08-07 MED ORDER — PRENATAL MULTIVITAMIN CH
1.0000 | ORAL_TABLET | Freq: Every day | ORAL | Status: DC
  Administered 2021-08-08 – 2021-08-10 (×3): 1 via ORAL
  Filled 2021-08-07 (×4): qty 1

## 2021-08-07 MED ORDER — LIDOCAINE-EPINEPHRINE (PF) 2 %-1:200000 IJ SOLN
INTRAMUSCULAR | Status: DC | PRN
  Administered 2021-08-07: 3 mL via EPIDURAL
  Administered 2021-08-07 (×3): 5 mL via EPIDURAL

## 2021-08-07 MED ORDER — DEXAMETHASONE SODIUM PHOSPHATE 4 MG/ML IJ SOLN
INTRAMUSCULAR | Status: AC
  Filled 2021-08-07: qty 1

## 2021-08-07 MED ORDER — INSULIN GLARGINE-YFGN 100 UNIT/ML ~~LOC~~ SOLN
10.0000 [IU] | Freq: Every day | SUBCUTANEOUS | Status: DC
  Administered 2021-08-07: 10 [IU] via SUBCUTANEOUS
  Filled 2021-08-07 (×2): qty 0.1

## 2021-08-07 MED ORDER — SCOPOLAMINE 1 MG/3DAYS TD PT72
MEDICATED_PATCH | TRANSDERMAL | Status: AC
  Filled 2021-08-07: qty 1

## 2021-08-07 MED ORDER — CEFAZOLIN SODIUM-DEXTROSE 2-4 GM/100ML-% IV SOLN: 2.0000 g | Freq: Once | INTRAVENOUS | Status: AC

## 2021-08-07 MED ORDER — STERILE WATER FOR IRRIGATION IR SOLN
Status: DC | PRN
  Administered 2021-08-07: 1000 mL

## 2021-08-07 MED ORDER — SODIUM CHLORIDE 0.9 % IR SOLN
Status: DC | PRN
  Administered 2021-08-07: 1000 mL

## 2021-08-07 MED ORDER — ACETAMINOPHEN 325 MG PO TABS
650.0000 mg | ORAL_TABLET | Freq: Four times a day (QID) | ORAL | Status: DC
  Administered 2021-08-07 – 2021-08-10 (×12): 650 mg via ORAL
  Filled 2021-08-07 (×12): qty 2

## 2021-08-07 MED ORDER — NALBUPHINE HCL 10 MG/ML IJ SOLN: 5.0000 mg | Freq: Once | INTRAMUSCULAR | Status: DC | PRN

## 2021-08-07 MED ORDER — CEFAZOLIN SODIUM-DEXTROSE 2-4 GM/100ML-% IV SOLN
INTRAVENOUS | Status: AC
  Filled 2021-08-07: qty 100

## 2021-08-07 MED ORDER — NALBUPHINE HCL 10 MG/ML IJ SOLN: 5.0000 mg | INTRAMUSCULAR | Status: DC | PRN

## 2021-08-07 MED ORDER — MORPHINE SULFATE (PF) 0.5 MG/ML IJ SOLN
INTRAMUSCULAR | Status: DC | PRN
  Administered 2021-08-07: 3 mg via EPIDURAL

## 2021-08-07 MED ORDER — FENTANYL-BUPIVACAINE-NACL 0.5-0.125-0.9 MG/250ML-% EP SOLN
EPIDURAL | Status: AC
  Filled 2021-08-07: qty 250

## 2021-08-07 MED ORDER — FENTANYL CITRATE (PF) 100 MCG/2ML IJ SOLN
INTRAMUSCULAR | Status: AC
  Filled 2021-08-07: qty 2

## 2021-08-07 MED ORDER — LACTATED RINGERS IV SOLN: INTRAVENOUS | Status: DC | PRN

## 2021-08-07 MED ORDER — FENTANYL CITRATE (PF) 100 MCG/2ML IJ SOLN: 25.0000 ug | INTRAMUSCULAR | Status: DC | PRN

## 2021-08-07 MED ORDER — ONDANSETRON HCL 4 MG/2ML IJ SOLN
INTRAMUSCULAR | Status: DC | PRN
  Administered 2021-08-07: 4 mg via INTRAVENOUS

## 2021-08-07 MED ORDER — POLYETHYLENE GLYCOL 3350 17 G PO PACK
17.0000 g | PACK | Freq: Every day | ORAL | Status: DC
  Administered 2021-08-08 – 2021-08-10 (×3): 17 g via ORAL
  Filled 2021-08-07 (×3): qty 1

## 2021-08-07 MED ORDER — SCOPOLAMINE 1 MG/3DAYS TD PT72
1.0000 | MEDICATED_PATCH | Freq: Once | TRANSDERMAL | Status: AC
  Administered 2021-08-07: 1.5 mg via TRANSDERMAL

## 2021-08-07 MED ORDER — ONDANSETRON HCL 4 MG/2ML IJ SOLN: 4.0000 mg | Freq: Three times a day (TID) | INTRAMUSCULAR | Status: DC | PRN

## 2021-08-07 MED ORDER — OXYCODONE HCL 5 MG PO TABS
5.0000 mg | ORAL_TABLET | ORAL | Status: DC | PRN
  Administered 2021-08-07 – 2021-08-08 (×5): 5 mg via ORAL
  Filled 2021-08-07 (×6): qty 1

## 2021-08-07 MED ORDER — NALOXONE HCL 0.4 MG/ML IJ SOLN: 0.4000 mg | INTRAMUSCULAR | Status: DC | PRN

## 2021-08-07 MED ORDER — INSULIN ASPART 100 UNIT/ML IJ SOLN: 0.0000 [IU] | Freq: Three times a day (TID) | INTRAMUSCULAR | Status: DC

## 2021-08-07 MED ORDER — ACETAMINOPHEN 10 MG/ML IV SOLN
INTRAVENOUS | Status: AC
  Filled 2021-08-07: qty 100

## 2021-08-07 MED ORDER — INSULIN ASPART 100 UNIT/ML IJ SOLN: 0.0000 [IU] | Freq: Every day | INTRAMUSCULAR | Status: DC

## 2021-08-07 MED ORDER — MORPHINE SULFATE (PF) 0.5 MG/ML IJ SOLN
INTRAMUSCULAR | Status: AC
  Filled 2021-08-07: qty 10

## 2021-08-07 MED ORDER — SIMETHICONE 80 MG PO CHEW: 80.0000 mg | CHEWABLE_TABLET | ORAL | Status: DC | PRN

## 2021-08-07 MED ORDER — OXYCODONE HCL 5 MG PO TABS: 5.0000 mg | ORAL_TABLET | Freq: Once | ORAL | Status: DC | PRN

## 2021-08-07 MED ORDER — NALOXONE HCL 4 MG/10ML IJ SOLN
1.0000 ug/kg/h | INTRAVENOUS | Status: DC | PRN
  Filled 2021-08-07: qty 5

## 2021-08-07 MED ORDER — ONDANSETRON HCL 4 MG/2ML IJ SOLN
4.0000 mg | Freq: Four times a day (QID) | INTRAMUSCULAR | Status: AC | PRN
  Administered 2021-08-07: 4 mg via INTRAVENOUS

## 2021-08-07 MED ORDER — SODIUM CHLORIDE 0.9 % IV SOLN
INTRAVENOUS | Status: AC
  Filled 2021-08-07: qty 500

## 2021-08-07 MED ORDER — DIPHENHYDRAMINE HCL 50 MG/ML IJ SOLN: 12.5000 mg | INTRAMUSCULAR | Status: DC | PRN

## 2021-08-07 MED ORDER — SIMETHICONE 80 MG PO CHEW
80.0000 mg | CHEWABLE_TABLET | Freq: Three times a day (TID) | ORAL | Status: DC
  Administered 2021-08-07 – 2021-08-10 (×9): 80 mg via ORAL
  Filled 2021-08-07 (×10): qty 1

## 2021-08-07 MED ORDER — INSULIN ASPART 100 UNIT/ML IJ SOLN: 4.0000 [IU] | Freq: Three times a day (TID) | INTRAMUSCULAR | Status: DC

## 2021-08-07 SURGICAL SUPPLY — 31 items
BENZOIN TINCTURE PRP APPL 2/3 (GAUZE/BANDAGES/DRESSINGS) ×2 IMPLANT
CHLORAPREP W/TINT 26ML (MISCELLANEOUS) ×2 IMPLANT
CLAMP CORD UMBIL (MISCELLANEOUS) IMPLANT
CLIP FILSHIE TUBAL LIGA STRL (Clip) IMPLANT
CLOTH BEACON ORANGE TIMEOUT ST (SAFETY) ×2 IMPLANT
DRAPE C SECTION CLR SCREEN (DRAPES) ×2 IMPLANT
DRSG OPSITE POSTOP 4X10 (GAUZE/BANDAGES/DRESSINGS) ×2 IMPLANT
ELECT REM PT RETURN 9FT ADLT (ELECTROSURGICAL) ×4
ELECTRODE REM PT RTRN 9FT ADLT (ELECTROSURGICAL) ×2 IMPLANT
EXTRACTOR VACUUM KIWI (MISCELLANEOUS) ×2 IMPLANT
GLOVE SURG ENC MOIS LTX SZ6 (GLOVE) ×2 IMPLANT
GLOVE SURG UNDER POLY LF SZ7 (GLOVE) ×4 IMPLANT
GOWN STRL REUS W/TWL LRG LVL3 (GOWN DISPOSABLE) ×4 IMPLANT
KIT ABG SYR 3ML LUER SLIP (SYRINGE) IMPLANT
NEEDLE HYPO 22GX1.5 SAFETY (NEEDLE) IMPLANT
NEEDLE HYPO 25X5/8 SAFETYGLIDE (NEEDLE) ×2 IMPLANT
NS IRRIG 1000ML POUR BTL (IV SOLUTION) ×2 IMPLANT
PACK C SECTION WH (CUSTOM PROCEDURE TRAY) ×2 IMPLANT
PAD OB MATERNITY 4.3X12.25 (PERSONAL CARE ITEMS) ×2 IMPLANT
RETRACTOR WND ALEXIS 25 LRG (MISCELLANEOUS) IMPLANT
RTRCTR WOUND ALEXIS 25CM LRG (MISCELLANEOUS)
STRIP CLOSURE SKIN 1/2X4 (GAUZE/BANDAGES/DRESSINGS) ×2 IMPLANT
SUT PLAIN 2 0 XLH (SUTURE) ×4 IMPLANT
SUT VIC AB 0 CT1 36 (SUTURE) ×6 IMPLANT
SUT VIC AB 2-0 CT1 27 (SUTURE) ×2
SUT VIC AB 2-0 CT1 TAPERPNT 27 (SUTURE) ×1 IMPLANT
SUT VIC AB 4-0 KS 27 (SUTURE) ×2 IMPLANT
SYR CONTROL 10ML LL (SYRINGE) IMPLANT
TOWEL OR 17X24 6PK STRL BLUE (TOWEL DISPOSABLE) ×2 IMPLANT
TRAY FOLEY W/BAG SLVR 14FR LF (SET/KITS/TRAYS/PACK) IMPLANT
WATER STERILE IRR 1000ML POUR (IV SOLUTION) ×2 IMPLANT

## 2021-08-07 NOTE — Progress Notes (Signed)
Brittany Wade is a 20 y.o. G1P0000 at [redacted]w[redacted]d admitted for DMII and gHTN   Subjective: Reported she felt pressure and at time of evaluation pushing with contractions  Objective: BP (!) 151/74   Pulse 86   Temp 98.1 F (36.7 C) (Axillary)   Resp 18   Ht 5\' 7"  (1.702 m)   Wt 97.7 kg   LMP 11/03/2020 (Approximate)   SpO2 100%   BMI 33.74 kg/m  No intake/output data recorded. Total I/O In: 131.1 [I.V.:131.1] Out: 975 [Urine:975]  FHT:  FHR: 160 bpm, variability: moderate,  accelerations:  Abscent,  decelerations:  Absent UC:   regular, every 1-3 minutes SVE:   Dilation: 10 Effacement (%): 100 Station: 0, Plus 1 Exam by:: [email or web address], RN  Labs: Lab Results  Component Value Date   WBC 17.4 (H) 08/06/2021   HGB 7.5 (L) 08/06/2021   HCT 25.7 (L) 08/06/2021   MCV 78.4 (L) 08/06/2021   PLT 266 08/06/2021    Assessment / Plan: G1P0000 at [redacted]w[redacted]d admitted for DMII and gHTN   Labor:  patient fully dilated. 0 station. Pushed for ~1 hr. Patient appears to be getting exhausted. Since still 0 station discussed option of laboring down and patient would like to do that. Will labor down for 1-2 hours and reassess. Suspected macrosomia baby. Will assess for arrest of descent actively with next round of pushing. If no descent after 2 more hours of pushing will discuss regarding options for cesarean section  Fetal Wellbeing:  Category I Pain Control:  Epidural I/D:   GBS neg #gHTN:  BP normotensive to mild range. Continue to monitor  [redacted]w[redacted]d 08/07/2021, 3:43 AM

## 2021-08-07 NOTE — Lactation Note (Signed)
This note was copied from a baby's chart.  NICU Lactation Consultation Note  Patient Name: Brittany Wade HALPF'X Date: 08/07/2021 Age:22 hours   Subjective Reason for consult: Initial assessment; NICU baby; Maternal endocrine disorder Mother attempted bf'ing p delivery. Her goal is to bf. I assisted her with pumping and provided initial education.   Objective Infant data: Mother's Current Feeding Choice: Breast Milk and Formula   Maternal data: G1P1001  C-Section, Low Transverse Significant Breast History:: R breast hypoplasia Does the patient have breastfeeding experience prior to this delivery?: No  Pumping frequency: first pumping at 8 hours p delivery. Encouraged to pump q3.  Risk factor for low milk supply:: hypoplasia, T2DM  Pump: Manual, Personal (DEBP at home)  Assessment Mother is at-risk for low milk supply. Infant will likely need supplementation.  Intervention/Plan Interventions: Breast feeding basics reviewed; Education; [employer] brochure; Hand pump; Hand express  Tools: Pump Pump Education: Setup, frequency, and cleaning; Milk Storage  Plan: Consult Status: Follow-up  NICU Follow-up type: New admission follow up; Assist with IDF-2 (Mother does not need to pre-pump before breastfeeding); Verify onset of copious milk  Mother to pump 15 minutes q3 hours. She is aware of LC support in NICU to assist with bf'ing prn.   [name] 08/07/2021, 5:00 PM

## 2021-08-07 NOTE — Transfer of Care (Signed)
Immediate Anesthesia Transfer of Care Note  Patient: Brittany Wade  Procedure(s) Performed: CESAREAN SECTION  Patient Location: PACU  Anesthesia Type:Epidural  Level of Consciousness: awake, alert  and oriented  Airway & Oxygen Therapy: Patient Spontanous Breathing  Post-op Assessment: Report given to RN and Post -op Vital signs reviewed and stable  Post vital signs: Reviewed and stable  Last Vitals:  Vitals Value Taken Time  BP 145/98 08/07/21 0825  Temp 37.1 C 08/07/21 0825  Pulse 132 08/07/21 0831  Resp 17 08/07/21 0831  SpO2 100 % 08/07/21 0831  Vitals shown include unvalidated device data.  Last Pain:  Vitals:   08/07/21 0825  TempSrc: Oral  PainSc: 0-No pain      Patients Stated Pain Goal: 0 (08/06/21 0459)  Complications: No notable events documented.

## 2021-08-07 NOTE — Progress Notes (Signed)
Pt complete at 0127, started pushing at 0128 with good maternal effort.  Pushed until 0218,with limited descent.  Pt c/o pain in back while pushing, resolved with break and position change to left lateral. Peanut ball used, pt tolerated 35 minutes before removal. Pt rested from pushing until 0404, when pushing resumed.Pushed from 0404 to 0500. Pt appears fatigued. Pt c/o intense pain in back and shoulders. Multiple position changes tried, relief while on side and break from pushing. Hip squeeze also offered some relief.  Pt frequently tearful during pushing efforts. Two support persons at bedside in addition to RN. [facility], RN

## 2021-08-07 NOTE — Anesthesia Postprocedure Evaluation (Signed)
Anesthesia Post Note  Patient: Brittany Wade  Procedure(s) Performed: CESAREAN SECTION     Patient location during evaluation: PACU Anesthesia Type: Epidural Level of consciousness: oriented and awake and alert Pain management: pain level controlled Vital Signs Assessment: post-procedure vital signs reviewed and stable Respiratory status: spontaneous breathing, respiratory function stable and patient connected to nasal cannula oxygen Cardiovascular status: blood pressure returned to baseline and stable Postop Assessment: no headache, no backache, no apparent nausea or vomiting and epidural receding Anesthetic complications: no   No notable events documented.  Last Vitals:  Vitals:   08/07/21 0915 08/07/21 0937  BP: (!) 98/49 (!) 109/56  Pulse: 86 93  Resp: 14 15  Temp: 37.6 C 37.6 C  SpO2: 99% 100%    Last Pain:  Vitals:   08/07/21 0937  TempSrc: Axillary  PainSc: 5    Pain Goal: Patients Stated Pain Goal: 0 (08/06/21 0459)                 Brinkley Peet L Rehan Holness

## 2021-08-07 NOTE — Progress Notes (Signed)
Patient would like to proceed with c-section. She has T2DM, known fetal macrosomia estimated to be about 4500g. She previously declined 1LTCS as she strongly desired trial of labor.   She has progressed to complete and has pushed just below 3 hours in total. She has not made progress from 0 station. The fetal position is suspected to be OP.   FHT are category 1.   Pitocin turned off upon pt consenting to c-section.   The risks of surgery were discussed with the patient including but were not limited to: bleeding which may require transfusion or reoperation; infection which may require antibiotics; injury to bowel, bladder, ureters or other surrounding organs; injury to the fetus; need for additional procedures including hysterectomy in the event of a life-threatening hemorrhage; formation of adhesions; placental abnormalities with subsequent pregnancies; incisional problems; thromboembolic phenomenon and other postoperative/anesthesia complications.    The patient concurred with the proposed plan, giving informed written consent for the procedure.   Anesthesia and OR aware. Preoperative prophylactic antibiotics and SCDs ordered on call to the OR.  To OR when ready.  Milas Hock, MD 6:50 AM 08/07/21

## 2021-08-07 NOTE — Discharge Summary (Signed)
Postpartum Discharge Summary    Patient Name: Brittany Wade DOB: Jul 14, 2001 MRN: [id number]  Date of admission: 08/05/2021 Delivery date:08/07/2021  Delivering provider: Radene Gunning  Date of discharge: 08/10/2021  Admitting diagnosis: Type 2 diabetes mellitus without complication, with long-term current use of insulin ([place]) [E11.9, Z79.4] Intrauterine pregnancy: [redacted]w[redacted]d    Secondary diagnosis:  Principal Problem:   Type 2 diabetes mellitus without complication, with long-term current use of insulin ([place] Active Problems:   Supervision of high-risk pregnancy   Marijuana use   Abnormal Pap smear of cervix   Preexisting diabetes complicating pregnancy, antepartum   Large for gestational age fetus affecting management of mother, antepartum, third trimester   Gestational hypertension   History of primary cesarean section  Additional problems: Arrest of Descent, Macrosomia (4500gm in setting of DMII)   Discharge diagnosis: Term Pregnancy Delivered                                              Post partum procedures: None Augmentation: AROM and Pitocin Complications: None  Hospital course: Induction of Labor With Cesarean Section   20y.o. yo G1P1001 at 384w4das admitted to the hospital 08/05/2021 for induction of labor. Patient had a labor course significant for arrest of descent, Macrosomia (4500gm in setting of DMII) . The patient went for cesarean section due to  arrest of descent . Delivery details are as follows: Membrane Rupture Time/Date: 4:19 PM ,08/06/2021   Delivery Method:C-Section, Low Transverse  Details of operation can be found in separate operative Note.  Patient was started on 5 day course of Lasix BID and Procardia 3057mor elevated blood pressures in setting gHTN. Her blood glucose was controlled on Lantus of 10U and Metformin 1000m72mD and was discharged on her pre-pregnancy doses of Lantus 8U and Metformin 1000mg44m.  She is ambulating, tolerating a regular diet,  passing flatus, and urinating well.  Patient is discharged home in stable condition on 08/10/21.      Newborn Data: Birth date:08/07/2021  Birth time:7:37 AM  Gender:Female  Living status:Living  Apgars:6 ,8  Weight:4020 g                                Magnesium Sulfate received: No BMZ received: No Rhophylac:N/A MMR:N/A T-DaP:Given prenatally Flu: No Transfusion:Yes  Physical exam  Vitals:   08/09/21 0614 08/09/21 1420 08/09/21 2159 08/10/21 0459  BP: 130/74 135/76 133/78 131/74  Pulse: 91 85 (!) 118 94  Resp: '16 17 18 18  ' Temp: 97.9 F (36.6 C) 98 F (36.7 C) 98 F (36.7 C) 98.2 F (36.8 C)  TempSrc: Oral Oral Oral Oral  SpO2: 100% 100% 98% 100%  Weight:      Height:       General: alert Lochia: appropriate Uterine Fundus: firm Incision: Dressing is clean, dry, and intact DVT Evaluation: No evidence of DVT seen on physical exam. Labs: Lab Results  Component Value Date   WBC 23.7 (H) 08/08/2021   HGB 8.1 (L) 08/08/2021   HCT 25.3 (L) 08/08/2021   MCV 77.7 (L) 08/08/2021   PLT 218 08/08/2021   CMP Latest Ref Rng & Units 08/06/2021  Glucose 70 - 99 mg/dL 87  BUN 6 - 20 mg/dL 8  Creatinine 0.44 - 1.00 mg/dL 0.63  Sodium 135 - 145 mmol/L 136  Potassium 3.5 - 5.1 mmol/L 4.0  Chloride 98 - 111 mmol/L 107  CO2 22 - 32 mmol/L 21(L)  Calcium 8.9 - 10.3 mg/dL 8.4(L)  Total Protein 6.5 - 8.1 g/dL 5.1(L)  Total Bilirubin 0.3 - 1.2 mg/dL <0.1(L)  Alkaline Phos 38 - 126 U/L 122  AST 15 - 41 U/L 16  ALT 0 - 44 U/L 9   Edinburgh Score: Edinburgh Postnatal Depression Scale Screening Tool 08/07/2021  I have been able to laugh and see the funny side of things. 0  I have looked forward with enjoyment to things. 0  I have blamed myself unnecessarily when things went wrong. 1  I have been anxious or worried for no good reason. 1  I have felt scared or panicky for no good reason. 1  Things have been getting on top of me. 0  I have been so unhappy that I have had  difficulty sleeping. 0  I have felt sad or miserable. 0  I have been so unhappy that I have been crying. 0  The thought of harming myself has occurred to me. 0  Edinburgh Postnatal Depression Scale Total 3     After visit meds:  Allergies as of 08/10/2021       Reactions   Red Dye Hives, Rash   Codeine Hives        Medication List     STOP taking these medications    aspirin 81 MG EC tablet       TAKE these medications    Accu-Chek Guide test strip Generic drug: glucose blood Use as instructed to check blood sugar 4 times daily   Accu-Chek Softclix Lancets lancets Use as instructed to check blood sugar 4 times daily   acetaminophen 325 MG tablet Commonly known as: TYLENOL Take 2 tablets (650 mg total) by mouth every 6 (six) hours as needed. What changed:  medication strength how much to take when to take this reasons to take this   albuterol 108 (90 Base) MCG/ACT inhaler Commonly known as: VENTOLIN HFA USE 2 PUFFS EVERY 6 HOURS AS NEEDED   atorvastatin 40 MG tablet Commonly known as: LIPITOR Take 40 mg by mouth daily.   Blood Pressure Monitor Misc For regular home bp monitoring during pregnancy   furosemide 20 MG tablet Commonly known as: LASIX Take 1 tablet (20 mg total) by mouth 2 (two) times daily for 4 days.   ibuprofen 600 MG tablet Commonly known as: ADVIL Take 1 tablet (600 mg total) by mouth every 6 (six) hours.   insulin aspart 100 UNIT/ML injection Commonly known as: novoLOG Inject 2 Units into the skin daily with supper.   IRON PO Take by mouth.   Lantus SoloStar 100 UNIT/ML Solostar Pen Generic drug: insulin glargine Inject 8 Units into the skin daily. What changed: how much to take   metFORMIN 1000 MG tablet Commonly known as: GLUCOPHAGE TAKE ONE TABLET TWICE A DAY WITH MEALS.   NIFEdipine 30 MG 24 hr tablet Commonly known as: ADALAT CC Take 1 tablet (30 mg total) by mouth daily.   oxyCODONE 5 MG immediate release  tablet Commonly known as: Oxy IR/ROXICODONE Take 1 tablet (5 mg total) by mouth every 4 (four) hours as needed for severe pain or breakthrough pain.   Pen Needles 32G X 4 MM Misc Use to inject insulin daily   polyethylene glycol 17 g packet Commonly known as: MIRALAX / GLYCOLAX Take 17 g by  mouth daily.   PRENATAL GUMMIES PO Take by mouth.         Discharge home in stable condition Infant Feeding: Breast Infant Disposition:home with mother Discharge instruction: per After Visit Summary and Postpartum booklet. Activity: Advance as tolerated. Pelvic rest for 6 weeks.  Diet: carb modified diet Future Appointments: Future Appointments  Date Time Provider Cassandra  08/16/2021 10:10 AM Janyth Pupa, DO [facility]  09/08/2021 11:10 AM Myrtis Ser, CNM [facility]   Follow up Visit: Message sent to [facility] by Dr. Cy Blamer on 12/5  Please schedule this patient for a In person postpartum visit in 4 weeks with the following provider: Any provider. Additional Postpartum F/U:2 hour GTT, Incision check 1 week, and BP check 1 week  High risk pregnancy complicated by: GDM and HTN Delivery mode:  C-Section, Low Transverse  Anticipated Birth Control:   phexxi and condoms   Renard Matter, MD, MPH OB Fellow, Faculty Practice

## 2021-08-07 NOTE — Op Note (Signed)
[name] PROCEDURE DATE: 08/07/2021  PREOPERATIVE DIAGNOSES: Intrauterine pregnancy at [redacted]w[redacted]d weeks gestation; failure to progress: arrest of descent and macrosomia  POSTOPERATIVE DIAGNOSES: The same  PROCEDURE: Primary Low Transverse Cesarean Section  SURGEON:  Dr. Milas Hock  ASSISTANT:  Dr. Warner Mccreedy  ANESTHESIOLOGY TEAM: Anesthesiologist: Achille Rich, MD; Elmer Picker, MD CRNA: Rhymer, Doree Fudge, CRNA  INDICATIONS: Brittany Wade is a 20 y.o. G1P1001 at [redacted]w[redacted]d here for cesarean section secondary to the indications listed under preoperative diagnoses; please see preoperative note for further details.  The risks of surgery were discussed with the patient including but were not limited to: bleeding which may require transfusion or reoperation; infection which may require antibiotics; injury to bowel, bladder, ureters or other surrounding organs; injury to the fetus; need for additional procedures including hysterectomy in the event of a life-threatening hemorrhage; formation of adhesions; placental abnormalities wth subsequent pregnancies; incisional problems; thromboembolic phenomenon and other postoperative/anesthesia complications.  The patient concurred with the proposed plan, giving informed written consent for the procedure.    FINDINGS:  Viable female infant in cephalic presentation.  Apgars 6 and 8.  Intact placenta, three vessel cord.  Normal uterus, fallopian tubes and ovaries bilaterally.  ANESTHESIA: epidural INTRAVENOUS FLUIDS: 1100 ml   ESTIMATED BLOOD LOSS: 597 ml URINE OUTPUT:  125 ml SPECIMENS: Placenta sent to pathology  COMPLICATIONS: None immediate  PROCEDURE IN DETAIL:  The patient preoperatively received intravenous antibiotics and had sequential compression devices applied to her lower extremities.  She was then taken to the operating room where the epidural was dosed up to a surgical level and found to be adequate. She was then placed in a dorsal  supine position with a leftward tilt, and prepped and draped in a sterile manner.  A foley catheter was  was already in place.  After an adequate timeout was performed, a Pfannenstiel skin incision was made with scalpel and carried through to the underlying layer of fascia. The fascia was incised in the midline, and this incision was extended bluntly. The rectus muscles were separated in the midline and the peritoneum was entered bluntly.   The Alexis self-retaining retractor was introduced into the abdominal cavity.  Attention was turned to the lower uterine segment where a low transverse hysterotomy was made with a scalpel and extended bilaterally bluntly.  The infant was successfully delivered, the cord was clamped and cut after one minute, and the infant was handed over to the awaiting neonatology team. Uterine massage was then administered, and the placenta delivered intact with a three-vessel cord. The uterus was then cleared of clots and debris.  The hysterotomy was closed with 0 Vicryl in a running locked fashion, and an imbricating layer was also placed with 0 Vicryl.  One additional figure-of-eight 0 Vicryl serosal stitches were placed to help with hemostasis.    The pelvis was cleared of all clot and debris. Hemostasis was confirmed on all surfaces.  The retractor was removed.  The peritoneum was closed with a 2-0 Vicryl running stitch. The fascia was then closed using 0 Vicryl in a running fashion.  The subcutaneous layer was irrigated, any areas of bleeding were cauterized with the bovie, and the tissue was closed with 2-0 plain gut in a running fashion. The skin was closed with a 4-0 Vicryl subcuticular stitch. The patient tolerated the procedure well. Sponge, instrument and needle counts were correct x 3.  She was taken to the recovery room in stable condition.

## 2021-08-07 NOTE — Anesthesia Procedure Notes (Signed)
Epidural Patient location during procedure: OB Start time: 08/06/2021 3:00 PM End time: 08/06/2021 3:11 PM  Staffing Anesthesiologist: Achille Rich, MD Performed: anesthesiologist   Preanesthetic Checklist Completed: patient identified, IV checked, risks and benefits discussed, monitors and equipment checked, pre-op evaluation and timeout performed  Epidural Patient position: sitting Prep: DuraPrep and site prepped and draped Patient monitoring: continuous pulse ox, blood pressure, heart rate and cardiac monitor Approach: midline Location: L3-L4 Injection technique: LOR air  Needle:  Needle type: Tuohy  Needle gauge: 17 G Needle length: 9 cm Needle insertion depth: 6 cm Catheter type: closed end flexible Catheter size: 19 Gauge Catheter at skin depth: 12 cm Test dose: negative  Assessment Sensory level: T8 Events: blood not aspirated, injection not painful, no injection resistance, no paresthesia and negative IV test  Additional Notes Patient identified. Risks/Benefits/Options discussed with patient including but not limited to bleeding, infection, nerve damage, paralysis, failed block, incomplete pain control, headache, blood pressure changes, nausea, vomiting, reactions to medication both or allergic, itching and postpartum back pain. Confirmed with bedside nurse the patient's most recent platelet count. Confirmed with patient that they are not currently taking any anticoagulation, have any bleeding history or any family history of bleeding disorders. Patient expressed understanding and wished to proceed. All questions were answered. Sterile technique was used throughout the entire procedure. Please see nursing notes for vital signs. Test dose was given through epidural catheter and negative prior to continuing to dose epidural or start infusion. Warning signs of high block given to the patient including shortness of breath, tingling/numbness in hands, complete motor block, or  any concerning symptoms with instructions to call for help. Patient was given instructions on fall risk and not to get out of bed. All questions and concerns addressed with instructions to call with any issues or inadequate analgesia.  Reason for block:procedure for pain

## 2021-08-08 LAB — GLUCOSE, CAPILLARY
Glucose-Capillary: 132 mg/dL — ABNORMAL HIGH (ref 70–99)
Glucose-Capillary: 109 mg/dL — ABNORMAL HIGH (ref 70–99)
Glucose-Capillary: 125 mg/dL — ABNORMAL HIGH (ref 70–99)
Glucose-Capillary: 180 mg/dL — ABNORMAL HIGH (ref 70–99)

## 2021-08-08 LAB — CBC
HCT: 17.8 % — ABNORMAL LOW (ref 36.0–46.0)
Hemoglobin: 5.4 g/dL — CL (ref 12.0–15.0)
MCH: 23.6 pg — ABNORMAL LOW (ref 26.0–34.0)
MCHC: 30.3 g/dL (ref 30.0–36.0)
MCV: 77.7 fL — ABNORMAL LOW (ref 80.0–100.0)
Platelets: 218 10*3/uL (ref 150–400)
RBC: 2.29 MIL/uL — ABNORMAL LOW (ref 3.87–5.11)
RDW: 21.1 % — ABNORMAL HIGH (ref 11.5–15.5)
WBC: 23.7 10*3/uL — ABNORMAL HIGH (ref 4.0–10.5)
nRBC: 0 % (ref 0.0–0.2)

## 2021-08-08 LAB — ABO/RH: ABO/RH(D): A POS

## 2021-08-08 LAB — HEMOGLOBIN AND HEMATOCRIT, BLOOD
HCT: 25.3 % — ABNORMAL LOW (ref 36.0–46.0)
Hemoglobin: 8.1 g/dL — ABNORMAL LOW (ref 12.0–15.0)

## 2021-08-08 LAB — PREPARE RBC (CROSSMATCH)

## 2021-08-08 MED ORDER — INSULIN GLARGINE-YFGN 100 UNIT/ML ~~LOC~~ SOLN
12.0000 [IU] | Freq: Every day | SUBCUTANEOUS | Status: DC
  Filled 2021-08-08: qty 0.12

## 2021-08-08 MED ORDER — FUROSEMIDE 20 MG PO TABS
20.0000 mg | ORAL_TABLET | Freq: Every day | ORAL | Status: DC
  Administered 2021-08-08 – 2021-08-09 (×2): 20 mg via ORAL
  Filled 2021-08-08 (×3): qty 1

## 2021-08-08 MED ORDER — SODIUM CHLORIDE 0.9% IV SOLUTION: Freq: Once | INTRAVENOUS | Status: AC

## 2021-08-08 MED ORDER — OXYCODONE HCL 5 MG PO TABS
5.0000 mg | ORAL_TABLET | ORAL | Status: DC | PRN
Start: 2021-08-08 — End: 2021-08-10
  Administered 2021-08-08 – 2021-08-10 (×10): 10 mg via ORAL
  Filled 2021-08-08 (×10): qty 2

## 2021-08-08 MED ORDER — INSULIN GLARGINE-YFGN 100 UNIT/ML ~~LOC~~ SOLN
10.0000 [IU] | Freq: Every day | SUBCUTANEOUS | Status: DC
  Administered 2021-08-08 – 2021-08-09 (×2): 10 [IU] via SUBCUTANEOUS
  Filled 2021-08-08 (×3): qty 0.1

## 2021-08-08 NOTE — Progress Notes (Signed)
Inpatient Diabetes Program Recommendations  AACE/ADA: New Consensus Statement on Inpatient Glycemic Control (2015)  Target Ranges:  Prepandial:   less than 140 mg/dL      Peak postprandial:   less than 180 mg/dL (1-2 hours)      Critically ill patients:  140 - 180 mg/dL   Lab Results  Component Value Date   GLUCAP 136 (H) 08/07/2021   HGBA1C 6.3 (H) 02/07/2021    Review of Glycemic Control  Latest Reference Range & Units 08/07/21 08:30 08/07/21 16:46 08/07/21 22:09  Glucose-Capillary 70 - 99 mg/dL 158 (H) 309 (H) 407 (H)  (H): Data is abnormally high Diabetes history: Type 1 DM (per Dr Shawnee Knapp) Outpatient Diabetes medications: Metformin 1000 mg BID, Lantus 18 units QD, Novolog 2 units TID Prior to Pregnancy: Lantus 8 units QD, Novolog TID Current orders for Inpatient glycemic control: Metformin 1000 mg BID, Semglee 10 units QHS Decadron 4 mg x 1  Inpatient Diabetes Program Recommendations:    Consider adding Novolog 0-6 units TID & HS and add Novolog 2 units TID (assuming patient consuming >50% of meals).   Thanks, Lujean Rave, MSN, RNC-OB Diabetes Coordinator [phone] (8a-5p)

## 2021-08-08 NOTE — Progress Notes (Addendum)
Patient ID: Brittany Wade, female   DOB: 06-23-2001, 20 y.o.   MRN: [id number]  POSTPARTUM PROGRESS NOTE  POD #1  Subjective:  Brittany Wade is a 20 y.o. G1P1001 s/p pLTCS at [redacted]w[redacted]d.    She reports she is doing well overall. Does report some lightheadedness when she was walking. She denies any problems with ambulating, voiding or po intake. Denies nausea or vomiting. She has passed flatus. Pain is well controlled. Has passed a couple medium sized clots.  Objective: Blood pressure (!) 122/57, pulse (!) 105, temperature 98.7 F (37.1 C), temperature source Oral, resp. rate 20, height 5\' 7"  (1.702 m), weight 97.7 kg, last menstrual period 11/03/2020, SpO2 100 %, unknown if currently breastfeeding.  Physical Exam:  General: alert, cooperative and no distress Chest: no respiratory distress Heart:regular rate, distal pulses intact Abdomen: soft, nontender,  Uterine Fundus: firm, appropriately tender DVT Evaluation: No calf swelling or tenderness Extremities: No edema Skin: warm, dry; incision clean/dry/intact w/ honeycomb dressing in place  Recent Labs    08/06/21 0958 08/08/21 0527  HGB 7.5* 5.4*  HCT 25.7* 17.8*    Assessment/Plan: Brittany Wade is a 20 y.o. G1P1001 s/p pLTCS at [redacted]w[redacted]d for arrest of descent and macrosomia.  POD#1 - Doing welll; pain well controlled.   Routine postpartum care  OOB, ambulated  Lovenox for VTE prophylaxis   #Acute blood loss anemia  Patient passed 150cc clot this morning. Otherwise bleeding overall appropriate. Anemia: Hgb trend 7.5>5.4. Subsequently received 2 units pRBCs  Start po ferrous sulfate every other day  #DMII Currently on Semglee 10U and Metformin 1000mg  BID - continue at current dose and follow up diabetes coordinator recs  Contraception: Phexxi and condoms Feeding: breast  Dispo: Plan for DC on POD#2   LOS: 3 days   [redacted]w[redacted]d, MS3 08/08/2021, 6:01 AM   GME ATTESTATION:  I saw and evaluated the patient. I  agree with the findings and the plan of care as documented in the student's note.  Marcelline Mates, MD, MPH OB Fellow, Faculty Practice [facility], Center for [facility] 08/08/2021 9:22 AM

## 2021-08-08 NOTE — Progress Notes (Signed)
CRITICAL VALUE STICKER  CRITICAL VALUE: hemoglobin 5.4  RECEIVER (on-site recipient of call): Doran Heater, RN   DATE & TIME NOTIFIED: 08/08/21 0540  MESSENGER (representative from lab):  MD NOTIFIED: Dr. Ephriam Jenkins  TIME OF NOTIFICATION: [phone]  RESPONSE: Order for 2 units pRBC

## 2021-08-08 NOTE — Lactation Note (Signed)
This note was copied from a baby's chart. Lactation Consultation Note  Patient Name: Brittany Wade LKJZP'H Date: 08/08/2021 Reason for consult: Follow-up assessment;Term;Maternal endocrine disorder;Primapara;1st time breastfeeding;NICU baby;Other (Comment) (LGA) Age:22 hours  LC Student Kiran Carline and LC Mili visited with Mom, 46 hours old full term NICU female, P1 and already getting droplets of colostrum. Educated on pumping schedule, pumping log, expectations, breast milk storage guidelines and skin to skin care. Mom voiced pumping is going well and sessions are comfortable.   Maternal Data  Mom's supply is WNL  Feeding Mother's Current Feeding Choice: Breast Milk and Formula  Lactation Tools Discussed/Used Tools: Pump Breast pump type: Double-Electric Breast Pump Pump Education: Setup, frequency, and cleaning;Milk Storage Reason for Pumping: Baby NICU Pumping frequency: 4/5x24 hours Pumped volume:  (Drops)  Interventions Interventions: Breast feeding basics reviewed;DEBP;Education;"The NICU and Your Baby" book;LC Services brochure  Plan of Care: Encouraged Mom to pump every 3 hours, at least 8 pumping sessions in 24 hours.   FOB present and supportive, helped mom with washing pump parts, logging pumping sessions and setting timer for consistent pumping.   All questions and concerns answered. Parents to call LC PRN.   Discharge Pump: DEBP;Personal (DEBP at home)  Consult Status Consult Status: Follow-up Date: 08/08/21 Follow-up type: In-patient    Melody Comas 08/08/2021, 6:12 PM

## 2021-08-09 LAB — GLUCOSE, CAPILLARY
Glucose-Capillary: 111 mg/dL — ABNORMAL HIGH (ref 70–99)
Glucose-Capillary: 85 mg/dL (ref 70–99)
Glucose-Capillary: 155 mg/dL — ABNORMAL HIGH (ref 70–99)

## 2021-08-09 LAB — TYPE AND SCREEN
ABO/RH(D): A POS
Antibody Screen: NEGATIVE
Unit division: 0
Unit division: 0

## 2021-08-09 LAB — BPAM RBC
Blood Product Expiration Date: 202212192359
Blood Product Expiration Date: 202212192359
ISSUE DATE / TIME: 202212060637
ISSUE DATE / TIME: 202212061103
Unit Type and Rh: 6200
Unit Type and Rh: 6200

## 2021-08-09 LAB — SURGICAL PATHOLOGY

## 2021-08-09 MED ORDER — FUROSEMIDE 20 MG PO TABS
20.0000 mg | ORAL_TABLET | Freq: Two times a day (BID) | ORAL | Status: DC
  Administered 2021-08-09 – 2021-08-10 (×2): 20 mg via ORAL
  Filled 2021-08-09 (×2): qty 1

## 2021-08-09 MED ORDER — NIFEDIPINE ER OSMOTIC RELEASE 30 MG PO TB24
30.0000 mg | ORAL_TABLET | Freq: Every day | ORAL | Status: DC
  Administered 2021-08-09 – 2021-08-10 (×2): 30 mg via ORAL
  Filled 2021-08-09 (×2): qty 1

## 2021-08-09 NOTE — Plan of Care (Signed)
Plan of care continued without complications this shift. Pt was eager to pump and visit with baby in NICU. This was encouraged and facilitated. Pain well-controlled with current med regiment. Discussed need for continued activity, fluids, and BM. Bleeding was minimal and pt was routinely out of bed to bathroom, reports urine is clear and yellow, and no clots are present with bleeding. Pt expressed interest in being discharged today if possible and staying with baby on NICU thereafter. Social work consult ordered for pt to ensure WIC benefits arranged and correctly coordinated.

## 2021-08-09 NOTE — Clinical Social Work Maternal (Signed)
CLINICAL SOCIAL WORK MATERNAL/CHILD NOTE  Patient Details  Name: Brittany Wade MRN: [id number] Date of Birth: 01/14/2001  Date:  08/09/2021  Clinical Social Worker Initiating Note:  Jasey Cortez, LCSW Date/Time: Initiated:  08/09/21/1537     Child's Name:  Brittany Wade   Biological Parents:  Mother, Father (Father: Brittany Wade)   Need for Interpreter:  None   Reason for Referral:  Behavioral Health Concerns, Current Substance Use/Substance Use During Pregnancy  , Other (Comment) (Infant's NICU Admission)   Address:  [address] [place] [address]    Phone number:  [phone] (home)     Additional phone number:   Household Members/Support Persons (HM/SP):   Household Member/Support Person 1   HM/SP Name Relationship DOB or Age  HM/SP -1 Brittany Wade FOB    HM/SP -2        HM/SP -3        HM/SP -4        HM/SP -5        HM/SP -6        HM/SP -7        HM/SP -8          Natural Supports (not living in the home):  Immediate Family, Parent   Professional Supports: None   Employment: Unemployed   Type of Work:     Education:  9 to 11 years (11th Grade)   Homebound arranged: No  Financial Resources:  Medicaid   Other Resources:  WIC, Food Stamps   (MOB reported that she has no conerns regarding her WIC)   Cultural/Religious Considerations Which May Impact Care:    Strengths:  Ability to meet basic needs  , Pediatrician chosen, Home prepared for child  , Understanding of illness   Psychotropic Medications:         Pediatrician:       Pediatrician List:   [place]    [place]    [place]    [place] [facility]  [place]    [place]      Pediatrician Fax Number:    Risk Factors/Current Problems:  Substance Use  , Mental Health Concerns     Cognitive State:  Alert  , Able to Concentrate  , Goal Oriented  , Linear Thinking     Mood/Affect:  Calm  , Interested  , Comfortable      CSW Assessment: CSW met with MOB at infant's bedside to complete psychosocial assessment, FOB present. MOB granted CSW verbal permission to speak in front of FOB about anything. CSW introduced self and explained role. Parents were welcoming, pleasant, open, and remained engaged during assessment. Parents reported that they reside together. MOB reported that she receives both WIC and food stamps. MOB reported that they have all items needed to care for infant including a car seat, crib, and basinet. CSW inquired about MOB's support system aside from FOB, MOB reported that FOB's parents and her dad are supports.   CSW inquired about MOB's mental health history. MOB reported that she was diagnosed with anxiety and depression around 20 years old. MOB denied any current anxiety and or depressive symptoms. MOB reported that she is not currently taking any medication nor participating in therapy to treat anxiety and depression. MOB reported that she does not need any therapy or medication at this time. CSW inquired about how MOB has been feeling emotionally since giving birth, MOB reported that she is feeling okay and glad that infant is   here. MOB presented calm and did not demonstrate any acute mental health signs/symptoms. CSW assessed for safety, MOB denied SI and HI. CSW did not assess for domestic violence as FOB was present. CSW and MOB discussed keeping a close watch on PPD signs/symptoms and helpful ways to increase mood as needed (getting rest, getting sunlight, and eating well).   CSW provided education regarding the baby blues period vs. perinatal mood disorders, discussed treatment and gave resources for mental health follow up if concerns arise.  CSW recommends self-evaluation during the postpartum time period using the New Mom Checklist from Postpartum Progress and encouraged MOB to contact a medical professional if symptoms are noted at any time.    CSW provided review of Sudden Infant Death  Syndrome (SIDS) precautions.    CSW and parents discussed infant's NICU admission. CSW informed parents about the NICU, what to expect, and resources/supports available while infant is admitted to the NICU. MOB reported that they feel well informed about infant's care and denied any transportation barriers with visiting infant in the NICU. Parents reported that meal vouchers would be helpful, CSW provided 6 meal vouchers. Parents denied any questions/concerns regarding the NICU.   CSW informed MOB about the hospital drug screen policy due to documented substance use during pregnancy. MOB confirmed marijuana use and reported last use as months ago. MOB denied any additional substance use during pregnancy. CSW informed MOB that infant's UDS was negative and CDS would continue to be monitored and a CPS report would be made if warranted. MOB verbalized understanding.   CSW will continue to offer resources/supports while infant is admitted to the NICU.   CSW Plan/Description:  Sudden Infant Death Syndrome (SIDS) Education, Perinatal Mood and Anxiety Disorder (PMADs) Education, Hospital Drug Screen Policy Information, CSW Will Continue to Monitor Umbilical Cord Tissue Drug Screen Results and Make Report if Warranted, Psychosocial Support and Ongoing Assessment of Needs, Other Patient/Family Education    Matis Monnier L Ceana Fiala, LCSW 08/09/2021, 3:40 PM 

## 2021-08-09 NOTE — Progress Notes (Signed)
Inpatient Diabetes Program Recommendations  AACE/ADA: New Consensus Statement on Inpatient Glycemic Control (2015)  Target Ranges:  Prepandial:   less than 140 mg/dL      Peak postprandial:   less than 180 mg/dL (1-2 hours)      Critically ill patients:  140 - 180 mg/dL   Lab Results  Component Value Date   GLUCAP 111 (H) 08/09/2021   HGBA1C 6.3 (H) 02/07/2021    Review of Glycemic Control  Latest Reference Range & Units 08/09/21 09:15  Glucose-Capillary 70 - 99 mg/dL 270 (H)  (H): Data is abnormally high  Diabetes history: DM2 Outpatient Diabetes medications: Lantus 8 units QHS and Metformin 1000 mg BID (prior to pregnancy) Current orders for Inpatient glycemic control: Semglee 10 units QHS, Metformin 1000 mg BID  Spoke with patient at bedside.  She confirms she has DM2 and confirmed above home DM medications prior to pregnancy.  She states her basal increased to 12 during pregnancy.  She has had occasional episodes of hypoglycemia and knows the signs and symptoms and how to treat.  She has a breast pump at bedside and states she is going to breast feed.  Educated patient on increased risks for hypoglycemia.    Will continue to follow while inpatient.  Thank you, Dulce Sellar, RN, BSN Diabetes Coordinator Inpatient Diabetes Program [phone] (team pager from 8a-5p)

## 2021-08-09 NOTE — Progress Notes (Signed)
POSTPARTUM PROGRESS NOTE  POD #2  Subjective:  Brittany Wade is a 20 y.o. G1P1001 s/p pLTCS at [redacted]w[redacted]d.   She reports she doing well. No acute events overnight. She reports she is doing well. She denies any problems with ambulating, voiding or po intake. Denies nausea or vomiting. She has passed flatus. Pain is well controlled.  Lochia is normal and improved from yesterday.  Objective: Blood pressure 134/78, pulse 85, temperature 98.5 F (36.9 C), temperature source Oral, resp. rate 20, height 5\' 7"  (1.702 m), weight 97.7 kg, last menstrual period 11/03/2020, SpO2 100 %, unknown if currently breastfeeding.  Physical Exam:  General: alert, cooperative and no distress Chest: no respiratory distress Heart:regular rate, distal pulses intact Abdomen: soft, nontender,  Uterine Fundus: firm, appropriately tender DVT Evaluation: No calf swelling or tenderness Extremities: Some mild ankle edema Skin: warm, dry; incision clean/dry/intact w/ honeycomb dressing in place  Recent Labs    08/08/21 0527 08/08/21 1752  HGB 5.4* 8.1*  HCT 17.8* 25.3*    Assessment/Plan: NAVAEH KEHRES is a 20 y.o. G1P1001 s/p pLTCS at [redacted]w[redacted]d for arrest of descent and macrosomia.  POD#2 - Doing welll; pain well controlled. Continue Miralax. Has red dye allergy with hives so can't do Senna.  Routine postpartum care  OOB, ambulated  Lovenox for VTE prophylaxis  #Acute blood loss anemia: asymptomatic. Last Hgb at 8.1, up from 5.4, s/p 2 units pRBCs. Vaginal blood loss much improved from yesterday morning.   Start po ferrous sulfate BID #DMII: Last 4 CBGs high at 180, 125, 109, 132. At home takes 0-5u Novolog after meals, 18u Lantus qhs, and Metformin 1000mg  BID -Continue Semglee 10u qhs and Metformin 1000mg  BID. -Will follow diabetes coordinator suggestions Contraception: Phexxi and condoms Feeding: breast  Dispo: Plan for discharge today or tomorrow.   LOS: 4 days   [redacted]w[redacted]d, MS3 08/09/2021, 5:22  AM

## 2021-08-10 ENCOUNTER — Other Ambulatory Visit (HOSPITAL_COMMUNITY): Payer: Self-pay

## 2021-08-10 MED ORDER — IBUPROFEN 600 MG PO TABS
600.0000 mg | ORAL_TABLET | Freq: Four times a day (QID) | ORAL | 0 refills | Status: DC
  Filled 2021-08-10: qty 60, 15d supply, fill #0

## 2021-08-10 MED ORDER — OXYCODONE HCL 5 MG PO TABS
5.0000 mg | ORAL_TABLET | ORAL | 0 refills | Status: DC | PRN
  Filled 2021-08-10: qty 15, 3d supply, fill #0

## 2021-08-10 MED ORDER — NIFEDIPINE ER 30 MG PO TB24
30.0000 mg | ORAL_TABLET | Freq: Every day | ORAL | 0 refills | Status: DC
  Filled 2021-08-10: qty 60, 60d supply, fill #0

## 2021-08-10 MED ORDER — ACETAMINOPHEN 325 MG PO TABS
650.0000 mg | ORAL_TABLET | Freq: Four times a day (QID) | ORAL | 0 refills | Status: AC | PRN
  Filled 2021-08-10: qty 60, 8d supply, fill #0

## 2021-08-10 MED ORDER — FUROSEMIDE 20 MG PO TABS
20.0000 mg | ORAL_TABLET | Freq: Two times a day (BID) | ORAL | 0 refills | Status: DC
  Filled 2021-08-10: qty 8, 4d supply, fill #0

## 2021-08-10 MED ORDER — SODIUM CHLORIDE 0.9 % IV SOLN
400.0000 mg | Freq: Once | INTRAVENOUS | Status: AC
  Administered 2021-08-10: 400 mg via INTRAVENOUS
  Filled 2021-08-10: qty 20

## 2021-08-10 MED ORDER — LANTUS SOLOSTAR 100 UNIT/ML ~~LOC~~ SOPN
8.0000 [IU] | PEN_INJECTOR | Freq: Every day | SUBCUTANEOUS | 11 refills | Status: DC
Start: 2021-08-10 — End: 2024-01-06
  Filled 2021-08-10: qty 3, 37d supply, fill #0

## 2021-08-10 MED ORDER — POLYETHYLENE GLYCOL 3350 17 GM/SCOOP PO POWD
17.0000 g | Freq: Every day | ORAL | 0 refills | Status: DC
  Filled 2021-08-10: qty 238, 14d supply, fill #0

## 2021-08-10 NOTE — Lactation Note (Signed)
This note was copied from a baby's chart. Lactation Consultation Note  Patient Name: Brittany Wade GYKZL'D Date: 08/10/2021 Reason for consult: Follow-up assessment;Primapara;1st time breastfeeding;NICU baby;Term Age:22 hours  I followed up with Brittany Wade. She reports obtaining approximately 10 mls from pumping around 0300 last night. She is aware of the education to pump q3 hours for 15 - 20 minutes. I noted a Brittany Wade in the room provided by her sister. Brittany Wade is interested in checking to see if she's eligible for a Stork Pump, and I asked her RN to place an order.  Brittany Wade has all needed supplies. She is anticipating discharge and plans to room in with baby Brittany Wade.   Maternal Data Does the patient have breastfeeding experience prior to this delivery?: No  Feeding Mother's Current Feeding Choice: Breast Milk and Formula  Lactation Tools Discussed/Used Tools: Pump Breast pump type: Double-Electric Breast Pump Pump Education: Setup, frequency, and cleaning Reason for Pumping: NICU Pumping frequency: rec q 3 hours Pumped volume: 10 mL  Interventions Interventions: Breast feeding basics reviewed;DEBP;Education  Discharge Discharge Education: Engorgement and breast care Pump: DEBP;Personal (Used [employer]; put in a stork pump request.) WIC Program: Yes  Consult Status Consult Status: Follow-up Date: 08/10/21 Follow-up type: In-patient    Walker Shadow 08/10/2021, 10:02 AM

## 2021-08-11 ENCOUNTER — Ambulatory Visit: Payer: Self-pay

## 2021-08-11 NOTE — Lactation Note (Signed)
This note was copied from a baby's chart.  NICU Lactation Consultation Note  Patient Name: Brittany Wade POLID'C Date: 08/11/2021 Age:22 days   Subjective Reason for consult: Follow-up assessment Mother was hand pumping when I arrived. She endorses also using electric pump. Mother complains of breast soreness/fullness that is relieved by pumping.   Objective Infant data: Mother's Current Feeding Choice: Breast Milk  Infant feeding assessment Scale for Readiness: 3   Maternal data: G1P1001  C-Section, Low Transverse Does the patient have breastfeeding experience prior to this delivery?: No  Pumping frequency: 5-6 x day: 48mL last pumping Pumped volume: 10 mL  WIC Program: Yes Pump: DEBP, Personal (Used [employer]; put in a stork pump request.)  Assessment Maternal: Milk volume: Normal No s/s of engorgement at this time.  Intervention/Plan Interventions: Education  Tools: Pump Pump Education: Setup, frequency, and cleaning  Plan: Consult Status: Follow-up  NICU Follow-up type: Verify absence of engorgement; Weekly NICU follow up  Mother to use electric pump q3  [name] 08/11/2021, 1:49 PM

## 2021-08-12 ENCOUNTER — Ambulatory Visit: Payer: Self-pay

## 2021-08-12 NOTE — Lactation Note (Signed)
This note was copied from a baby's chart.  NICU Lactation Consultation Note  Patient Name: Brittany Wade VBTYO'M Date: 08/12/2021 Age:22 days   Subjective Reason for consult: Follow-up assessment Mother continues to pump frequently. She endorses improvement in breast comfort since yesterday.   Mother was bottle feeding during my visit. She is interested in challenging baby at the breast. We scheduled a [facility] consult for tomorrow at the 12pm feeding. Mother will not pre-pump.   Objective Infant data: Mother's Current Feeding Choice: Breast Milk  Infant feeding assessment Scale for Readiness: 3  Maternal data: G1P1001  C-Section, Low Transverse Pumping frequency: q3 Pumped volume: 40 mL  WIC Program: Yes Pump: DEBP, Personal (Used [employer]; put in a stork pump request.)  Assessment Maternal: Milk volume: Normal   Intervention/Plan Interventions: Education  Plan: Consult Status: Follow-up  NICU Follow-up type: Weekly NICU follow up; Assist with IDF-2 (Mother does not need to pre-pump before breastfeeding)    [name] 08/12/2021, 10:30 AM

## 2021-08-13 ENCOUNTER — Ambulatory Visit: Payer: Self-pay

## 2021-08-13 NOTE — Lactation Note (Signed)
This note was copied from a baby's chart. Lactation Consultation Note  Patient Name: Brittany Wade YJEHU'D Date: 08/13/2021 Reason for consult: Follow-up assessment;NICU baby;Breastfeeding assistance;1st time breastfeeding;Primapara;Maternal endocrine disorder;Other (Comment) (LGA) Age:22 days  Visited with mom of 22 days old FT NICU female, mom requested a feeding assist at 6 pm. LC assisted with latching and positioning in cross cradle at both breasts, baby would not latch at first at the bare breast; but he did well with a NS # 24. Observed signs of milk transfer with swallows and softening of the breast at the end of feeding on L side. Assisted mom with NS placement (she did teach back) on R side, baby still nursing when exiting the room at the 25 minutes mark.  Reviewed pumping schedule, feeding cues, power pumping and lactogenesis III.   Maternal Data  Mom's supply is slowly increasing and WNL  Feeding Mother's Current Feeding Choice: Breast Milk and Formula Nipple Type: Dr. Levert Feinstein Preemie  LATCH Score Latch: Grasps breast easily, tongue down, lips flanged, rhythmical sucking. (with NS # 24)  Audible Swallowing: A few with stimulation  Type of Nipple: Flat  Comfort (Breast/Nipple): Soft / non-tender  Hold (Positioning): Assistance needed to correctly position infant at breast and maintain latch.  LATCH Score: 7  Lactation Tools Discussed/Used Tools: Pump;Flanges Flange Size: 24;27 Breast pump type: Double-Electric Breast Pump Pump Education: Setup, frequency, and cleaning;Milk Storage Reason for Pumping: NICU infant Pumping frequency: 7 times/24 hours Pumped volume: 40 mL (40-60 ml)  Interventions Interventions: Assisted with latch;Skin to skin;Breast massage;Hand express;Breast compression;Adjust position;Support pillows;DEBP;Education  Plan of care  Encouraged mom to continue pumping consistently, at least 8 times/24 hours. She'll power pump if she doesn't  wake up at night to her alarm clock She'll start taking baby to breast on feeding cues using NS # 24 PRN  FOB present and supportive. All questions and concerns answered, parents to call NICU LC PRN.  Discharge Pump: DEBP;Personal Doy Mince Motif)  Consult Status Consult Status: Follow-up Date: 08/13/21 Follow-up type: In-patient   Brittany Wade 08/13/2021, 6:51 PM

## 2021-08-13 NOTE — Lactation Note (Signed)
This note was copied from a baby's chart. Lactation Consultation Note  Patient Name: Boy Dot Splinter KHVFM'B Date: 08/13/2021   Age:22 days  LC came in the room for the 12 pm feeding assist but mom voiced she'd like to wait until baby's PICC line is d/c. LC to come back for the 6 pm feeding assist, NICU RN reported that provider is planning on pulling the line out some time today.  Feeding Nipple Type: Dr. Levert Feinstein Preemie   Jacey Eckerson S Shem Plemmons 08/13/2021, 12:15 PM

## 2021-08-14 ENCOUNTER — Ambulatory Visit: Payer: Self-pay

## 2021-08-14 NOTE — Lactation Note (Signed)
This note was copied from a baby's chart.  NICU Lactation Consultation Note  Patient Name: Brittany Wade PTWSF'K Date: 08/14/2021 Age:22 days   Subjective Reason for consult: MD order; Follow-up assessment; Breastfeeding assistance; Maternal endocrine disorder Baby beginning ad lib feeding today. He is a competent breastfeeder but appears hungry after long bf'ing sessions. LC reviewed with mother and NNP that baby will likely need supplementation p bf'ing related to mom's breast and health hx. Parents are open to supplementing p bf'ing.   Objective Infant data: Mother's Current Feeding Choice: Breast Milk and Formula  Infant feeding assessment Scale for Readiness: 1 Scale for Quality: 2  Maternal data: G1P1001  C-Section, Low Transverse Significant Breast History:: R breast hypoplasia, breast asymmetry,  Current breast feeding challenges:: Baby still cuing after 30-40 minute breastfeeding sessions  Pumping frequency: 7 times/24 hours Pumped volume: 40 mL (40-60 ml) Flange Size: 24; 27  Risk factor for low milk supply:: Breast hypoplasia   WIC Program: Yes Pump: DEBP, Personal ([name])  Assessment Infant: Infant is likely transferring available milk. Swallows heard only with every 3-4 sucks. Infant will likely need supplementation p bf'ing to support growth and development.   Feeding Status: Ad lib   Maternal: Milk volume: Normal Although mom's reported (pumped) milk volume appears wnl at 7 days pp, she has marked asymmetry and hypoplasia of the R breast. It is likely that her rate of milk synthesis will be insufficient to meet infant's overall growth and development needs.   Intervention/Plan Interventions: Breast feeding basics reviewed; Support pillows; Education  Tools: Nipple Shields Pump Education: Setup, frequency, and cleaning; Milk Storage Nipple shield size: 24  Plan: Consult Status: Follow-up  NICU Follow-up type: Weekly NICU follow  up  Mother to bf on demand followed by supplementation prn if baby continues to show feeding/hunger cues after (approximately) 30 minutes of bf'ing.  Mother to continue post pumping for breast stimulation.    [name] 08/14/2021, 11:25 AM

## 2021-08-15 ENCOUNTER — Ambulatory Visit: Payer: Self-pay

## 2021-08-15 NOTE — Lactation Note (Signed)
This note was copied from a baby's chart. Lactation Consultation Note  Patient Name: Brittany Wade CBJSE'G Date: 08/15/2021 Reason for consult: Follow-up assessment;Primapara;1st time breastfeeding;NICU baby;Term;Maternal endocrine disorder Age:22 days  Visited with mom of 14 days old FT NICU female, baby is going home today. Reviewed discharge education, feeding cues, supplementation and pumping schedule. Mom understands the importance of consistent pumping and/or pumping after feedings at the breast to protect her supply.   Mom will continue supplementing baby with her EBM/formula after feedings at the breast if feeding cues are still present. FOB present. All questions and concerns answered, parents to contact NICU LC or LC OP PRN.  Feeding Mother's Current Feeding Choice: Breast Milk and Formula  Lactation Tools Discussed/Used Tools: Pump;Flanges Flange Size: 24;27 Breast pump type: Double-Electric Breast Pump Pump Education: Setup, frequency, and cleaning;Milk Storage Reason for Pumping: NICU infant Pumping frequency: 7 times/24 hours Pumped volume: 40 mL (40-60 ml)  Interventions Interventions: Breast feeding basics reviewed;DEBP;Education  Discharge Discharge Education: Outpatient recommendation (mom politely declined, she said she'll contact PRN) Pump: DEBP;Personal Doy Mince Motif)  Consult Status Consult Status: Complete Date: 08/15/21 Follow-up type: Call as needed   Kelse Ploch Venetia Constable 08/15/2021, 5:18 PM

## 2021-08-16 ENCOUNTER — Encounter: Payer: Self-pay | Admitting: Women's Health

## 2021-08-16 ENCOUNTER — Encounter: Payer: Medicaid Other | Admitting: Obstetrics & Gynecology

## 2021-08-22 ENCOUNTER — Telehealth (HOSPITAL_COMMUNITY): Payer: Self-pay | Admitting: *Deleted

## 2021-08-22 NOTE — Telephone Encounter (Signed)
Patient voiced no questions or concerns at this time. EPDS=2. Patient voiced no questions or concerns regarding infant at this time. Patient reports infant sleeps in a crib or bassinet on his back. RN reviewed ABCs of safe sleep. Patient verbalized understanding. Deforest Hoyles, RN, 08/22/21, [phone]

## 2021-09-08 ENCOUNTER — Encounter: Payer: Self-pay | Admitting: Advanced Practice Midwife

## 2021-09-08 ENCOUNTER — Other Ambulatory Visit: Payer: Self-pay

## 2021-09-08 ENCOUNTER — Ambulatory Visit (INDEPENDENT_AMBULATORY_CARE_PROVIDER_SITE_OTHER): Payer: Medicaid Other | Admitting: Advanced Practice Midwife

## 2021-09-08 MED ORDER — PHEXXI 1.8-1-0.4 % VA GEL: 1.0000 | VAGINAL | 3 refills | Status: DC | PRN

## 2021-09-08 NOTE — Progress Notes (Signed)
POSTPARTUM VISIT Patient name: Brittany Wade MRN [id number]  Date of birth: 08-25-2001 Chief Complaint:   Postpartum Care  History of Present Illness:   Brittany Wade is a 21 y.o. G48P1001 Caucasian female being seen today for a postpartum visit. She is  4.5  weeks postpartum following a primary cesarean section, low transverse incision at 39.4 gestational weeks. IOL: yes, for diabetes mellitus T2DM . Anesthesia: epidural.  Laceration: n/a.  Complications: none. Inpatient contraception: no.   Pregnancy complicated by M1DQ with gHTN intrapartum . Tobacco use: former . Substance use disorder: no. Last pap smear: June 2022 and results were LSIL-H w/ HRHPV not done. Next pap smear due: June 2023 No LMP recorded.  Postpartum course has been uncomplicated. Bleeding scant staining. Bowel function is normal. Bladder function is normal. Urinary incontinence? no, fecal incontinence? no Patient is not sexually active. Last sexual activity: prior to birth of baby. Desired contraception:  Phexxi and condoms . Patient does want a pregnancy in the future.  Desired family size is unsure number of children.   Upstream - 09/08/21 1120       Pregnancy Intention Screening   Does the patient want to become pregnant in the next year? No    Does the patient's partner want to become pregnant in the next year? No    Would the patient like to discuss contraceptive options today? Yes      Contraception Wrap Up   Current Method Abstinence    Contraception Counseling Provided Yes            The pregnancy intention screening data noted above was reviewed. Potential methods of contraception were discussed. The patient elected to proceed with No data recorded.  Edinburgh Postpartum Depression Screening: negative  Edinburgh Postnatal Depression Scale - 09/08/21 1121       Edinburgh Postnatal Depression Scale:  In the Past 7 Days   I have been able to laugh and see the funny side of things. 0    I have  looked forward with enjoyment to things. 0    I have blamed myself unnecessarily when things went wrong. 2    I have been anxious or worried for no good reason. 0    I have felt scared or panicky for no good reason. 0    Things have been getting on top of me. 1    I have been so unhappy that I have had difficulty sleeping. 0    I have felt sad or miserable. 0    I have been so unhappy that I have been crying. 0    The thought of harming myself has occurred to me. 0    Edinburgh Postnatal Depression Scale Total 3             GAD 7 : Generalized Anxiety Score 05/22/2021 02/07/2021 02/07/2021 12/26/2020  Nervous, Anxious, on Edge 1 0 0 1  Control/stop worrying 0 _0 Worry too much - different things 0 1 1 0  Trouble relaxing _1 0  Restless 0 0 0 0  Easily annoyed or irritable 0 _2 Afraid - awful might happen 0 0 0 0  Total GAD 7 Score _3 Baby's course has been complicated by NICU stay for ~8d for blood sugar control . Baby is feeding by bottle. Infant has a pediatrician/family doctor? Yes.  Childcare strategy if returning to work/school: n/a-not working/going back to  school.  Pt has material needs met for her and baby: Yes.   Review of Systems:   Pertinent items are noted in HPI Denies Abnormal vaginal discharge w/ itching/odor/irritation, headaches, visual changes, shortness of breath, chest pain, abdominal pain, severe nausea/vomiting, or problems with urination or bowel movements. Pertinent History Reviewed:  Reviewed past medical,surgical, obstetrical and family history.  Reviewed problem list, medications and allergies. OB History  Gravida Para Term Preterm AB Living  _0 0 0 1  SAB IAB Ectopic Multiple Live Births  0 0 0 0 1    # Outcome Date GA Lbr Len/2nd Weight Sex Delivery Anes PTL Lv  1 Term 08/07/21 [id number]:27 / 06:10 8 lb 13.8 oz (4.02 kg) M CS-LTranv EPI  LIV   Physical Assessment:   Vitals:   09/08/21 1121  BP: 123/65  Pulse: 86  Weight:  183 lb (83 kg)  Height: 5' 7" (1.702 m)  Body mass index is 28.66 kg/m.       Physical Examination:   General appearance: alert, well appearing, and in no distress  Mental status: alert, oriented to person, place, and time  Skin: warm & dry   Cardiovascular: normal heart rate noted   Respiratory: normal respiratory effort, no distress   Breasts: deferred, no complaints   Abdomen: soft, appropriately tender; LTCS incision healing well with steri-strips still intact- removed;   Pelvic: examination not indicated. Thin prep pap obtained: No  Rectal: not examined  Extremities: Edema: none         No results found for this or any previous visit (from the past 24 hour(s)).  Assessment & Plan:  1) Postpartum exam 2) 4.5 wks s/p primary cesarean section, low transverse incision 3) bottle feeding 4) Depression screening- negative 5) [place], and written info given; will also use condoms 6) T2DM- on Metformin 10065mbid and Lantus 8u- doing well 7) gHTN- no meds- resolved  Essential components of care per ACOG recommendations:  1.  Mood and well being:  If positive depression screen, discussed and plan developed.  If using tobacco we discussed reduction/cessation and risk of relapse If current substance abuse, we discussed and referral to local resources was offered.   2. Infant care and feeding:  If breastfeeding, discussed returning to work, pumping, breastfeeding-associated pain, guidance regarding return to fertility while lactating if not using another method. If needed, patient was provided with a letter to be allowed to pump q 2-3hrs to support lactation in a private location with access to a refrigerator to store breastmilk.   Recommended that all caregivers be immunized for flu, pertussis and other preventable communicable diseases If pt does not have material needs met for her/baby, referred to local  resources for help obtaining these.  3. Sexuality, contraception and birth spacing Provided guidance regarding sexuality, management of dyspareunia, and resumption of intercourse Discussed avoiding interpregnancy interval <31m41m and recommended birth spacing of 18 months  4. Sleep and fatigue Discussed coping options for fatigue and sleep disruption Encouraged family/partner/community support of 4 hrs of uninterrupted sleep to help with mood and fatigue  5. Physical recovery  If pt had a C/S, assessed incisional pain and providing guidance on normal vs prolonged recovery If pt had a laceration, perineal healing and pain reviewed.  If urinary or fecal incontinence, discussed management and referred to PT or uro/gyn if indicated  Patient is safe to resume physical activity. Discussed attainment of healthy weight.  6.  Chronic disease management Discussed pregnancy complications if any, and their implications for future childbearing and long-term maternal health. Review recommendations for prevention of recurrent pregnancy complications, such as 17 hydroxyprogesterone caproate to reduce risk for recurrent PTB not applicable, or aspirin to reduce risk of preeclampsia yes. Pt had GDM: no. If yes, 2hr GTT scheduled: (n/a; T2DM). Reviewed medications and non-pregnant dosing including consideration of whether pt is breastfeeding using a reliable resource such as LactMed: not applicable Referred for f/u w/ PCP or subspecialist providers as indicated: not applicable  7. Health maintenance Mammogram at 23yo or earlier if indicated Pap smears as indicated  Meds:  Meds ordered this encounter  Medications   Lactic Ac-Citric Ac-Pot Bitart (PHEXXI) 1.8-1-0.4 % GEL    Sig: Place 1 applicator vaginally as needed (up to 1 hour before sex).    Dispense:  60 g    Refill:  3    Order Specific Question:   Supervising Provider    Answer:   Florian Buff [[id number]]    Follow-up: Return in about 5 months  (around 02/15/2022) for Pap.   No orders of the defined types were placed in this encounter.   Myrtis Ser CNM 09/08/2021 11:49 AM

## 2021-09-19 ENCOUNTER — Emergency Department (HOSPITAL_COMMUNITY)
Admission: EM | Admit: 2021-09-19 | Discharge: 2021-09-19 | Disposition: A | Discharge status: Home / Self Care | Payer: Medicaid Other | Attending: Emergency Medicine | Admitting: Emergency Medicine

## 2021-09-19 ENCOUNTER — Encounter (HOSPITAL_COMMUNITY): Payer: Self-pay | Admitting: *Deleted

## 2021-09-19 ENCOUNTER — Emergency Department (HOSPITAL_COMMUNITY): Payer: Medicaid Other

## 2021-09-19 ENCOUNTER — Other Ambulatory Visit: Payer: Self-pay

## 2021-09-19 DIAGNOSIS — S92355A Nondisplaced fracture of fifth metatarsal bone, left foot, initial encounter for closed fracture: Secondary | ICD-10-CM | POA: Insufficient documentation

## 2021-09-19 DIAGNOSIS — Y92009 Unspecified place in unspecified non-institutional (private) residence as the place of occurrence of the external cause: Secondary | ICD-10-CM | POA: Insufficient documentation

## 2021-09-19 DIAGNOSIS — S99922A Unspecified injury of left foot, initial encounter: Secondary | ICD-10-CM | POA: Diagnosis present

## 2021-09-19 DIAGNOSIS — W1830XA Fall on same level, unspecified, initial encounter: Secondary | ICD-10-CM | POA: Diagnosis not present

## 2021-09-19 MED ORDER — IBUPROFEN 800 MG PO TABS: 800.0000 mg | ORAL_TABLET | Freq: Three times a day (TID) | ORAL | 0 refills | Status: DC | PRN

## 2021-09-19 NOTE — Discharge Instructions (Addendum)
Schedule to see Dr. Aline Brochure for recheck in 1 week

## 2021-09-19 NOTE — ED Triage Notes (Signed)
Fell at home last night, pain in left foot and ankle

## 2021-09-19 NOTE — ED Notes (Signed)
Pt requests crutches for assistance with walking. Pt fitted for crutches with sufficient return demonstration.

## 2021-09-19 NOTE — ED Provider Notes (Signed)
[facility] EMERGENCY DEPARTMENT Provider Note   CSN: [id number] Arrival date & time: 09/19/21  1409     History  Chief Complaint  Patient presents with   Ankle Pain   Foot Pain  Pt reports her leg fell asleep while sitting.  Pt reports she stood up and her leg was numb.  Pt reports she fell onto her foot.  Pt complains of swelling and pain to her foot  Brittany Wade is a 21 y.o. female.       Home Medications Prior to Admission medications   Medication Sig Start Date End Date Taking? Authorizing Provider  ibuprofen (ADVIL) 800 MG tablet Take 1 tablet (800 mg total) by mouth every 8 (eight) hours as needed. 09/19/21  Yes Cheron Schaumann K, PA-C  Accu-Chek Softclix Lancets lancets Use as instructed to check blood sugar 4 times daily 12/29/20   Cheral Marker, CNM  acetaminophen (TYLENOL) 325 MG tablet Take 2 tablets (650 mg total) by mouth every 6 (six) hours as needed. 08/10/21 10/09/21  Warner Mccreedy, MD  albuterol (VENTOLIN HFA) 108 (90 Base) MCG/ACT inhaler USE 2 PUFFS EVERY 6 HOURS AS NEEDED 08/30/20   Gwenlyn Fudge, FNP  atorvastatin (LIPITOR) 40 MG tablet Take 40 mg by mouth daily. 05/06/21   [provider]  Blood Pressure Monitor MISC For regular home bp monitoring during pregnancy 02/07/21   Cheral Marker, CNM  Ferrous Sulfate (IRON PO) Take by mouth.    [provider]  furosemide (LASIX) 20 MG tablet Take 1 tablet (20 mg total) by mouth 2 (two) times daily for 4 days. 08/10/21 08/14/21  Warner Mccreedy, MD  glucose blood (ACCU-CHEK GUIDE) test strip Use as instructed to check blood sugar 4 times daily 12/29/20   Cheral Marker, CNM  insulin aspart (NOVOLOG) 100 UNIT/ML injection Inject 2 Units into the skin daily with supper. 05/22/21 08/20/21  Lazaro Arms, MD  insulin glargine (LANTUS SOLOSTAR) 100 UNIT/ML Solostar Pen Inject 8 Units into the skin daily. 08/10/21   Warner Mccreedy, MD  Insulin Pen Needle (PEN NEEDLES) 32G X 4 MM MISC Use to inject insulin  daily 09/13/20   Daryll Drown, NP  Lactic Ac-Citric Ac-Pot Bitart (PHEXXI) 1.8-1-0.4 % GEL Place 1 applicator vaginally as needed (up to 1 hour before sex). 09/08/21   Cam Hai D, CNM  metFORMIN (GLUCOPHAGE) 1000 MG tablet TAKE ONE TABLET TWICE A DAY WITH MEALS. 05/09/21   Gwenlyn Fudge, FNP  NIFEdipine (ADALAT CC) 30 MG 24 hr tablet Take 1 tablet (30 mg total) by mouth daily. 08/10/21 10/09/21  Warner Mccreedy, MD  oxyCODONE (OXY IR/ROXICODONE) 5 MG immediate release tablet Take 1 tablet (5 mg total) by mouth every 4 (four) hours as needed for severe pain or breakthrough pain. 08/10/21   Warner Mccreedy, MD  polyethylene glycol powder (GLYCOLAX/MIRALAX) 17 GM/SCOOP powder Take 17 g by mouth daily. 08/10/21   Warner Mccreedy, MD  Prenatal MV & Min w/FA-DHA (PRENATAL GUMMIES PO) Take by mouth.    [provider]      Allergies    Red dye and Codeine    Review of Systems   Review of Systems  Musculoskeletal:  Positive for back pain.  All other systems reviewed and are negative.  Physical Exam Updated Vital Signs BP 121/89 (BP Location: Left Arm)    Pulse 96    Temp 98 F (36.7 C) (Oral)    Resp 14    SpO2 100%  Physical  Exam Vitals and nursing note reviewed.  Constitutional:      Appearance: Normal appearance. She is well-developed.  HENT:     Head: Normocephalic.  Cardiovascular:     Rate and Rhythm: Normal rate and regular rhythm.  Pulmonary:     Effort: Pulmonary effort is normal.  Abdominal:     General: There is no distension.  Musculoskeletal:        General: Swelling and tenderness present.     Cervical back: Normal range of motion.     Comments: Tender 5th metatarsal nv and ns intact  Skin:    General: Skin is warm.  Neurological:     General: No focal deficit present.     Mental Status: She is alert and oriented to person, place, and time.  Psychiatric:        Mood and Affect: Mood normal.        Behavior: Behavior normal.    ED Results / Procedures / Treatments    Labs (all labs ordered are listed, but only abnormal results are displayed) Labs Reviewed - No data to display  EKG None  Radiology DG Ankle Complete Left  Result Date: 09/19/2021 CLINICAL DATA:  Fall, rolled left foot and ankle EXAM: LEFT FOOT - COMPLETE 3+ VIEW; LEFT ANKLE COMPLETE - 3+ VIEW COMPARISON:  None. FINDINGS: Ankle: There is no acute fracture or dislocation. Alignment is normal. The ankle mortise is intact. There is soft tissue swelling over the lateral malleolus. Foot: There is a minimally displaced fracture of the base of the fifth metatarsal with mild surrounding soft tissue swelling. There is no other acute fracture. Alignment is normal. The Lisfranc and Chopart joints are intact. IMPRESSION: 1. Minimally displaced fracture of the base of the fifth metatarsal. 2. Soft tissue swelling over the lateral malleolus. No ankle fracture identified. Electronically Signed   By: Lesia Hausen M.D.   On: 09/19/2021 14:47   DG Foot Complete Left  Result Date: 09/19/2021 CLINICAL DATA:  Fall, rolled left foot and ankle EXAM: LEFT FOOT - COMPLETE 3+ VIEW; LEFT ANKLE COMPLETE - 3+ VIEW COMPARISON:  None. FINDINGS: Ankle: There is no acute fracture or dislocation. Alignment is normal. The ankle mortise is intact. There is soft tissue swelling over the lateral malleolus. Foot: There is a minimally displaced fracture of the base of the fifth metatarsal with mild surrounding soft tissue swelling. There is no other acute fracture. Alignment is normal. The Lisfranc and Chopart joints are intact. IMPRESSION: 1. Minimally displaced fracture of the base of the fifth metatarsal. 2. Soft tissue swelling over the lateral malleolus. No ankle fracture identified. Electronically Signed   By: Lesia Hausen M.D.   On: 09/19/2021 14:47    Procedures Procedures    Medications Ordered in ED Medications - No data to display  ED Course/ Medical Decision Making/ A&P                           Medical Decision  Making Amount and/or Complexity of Data Reviewed External Data Reviewed:     Details: primary care notes Radiology: ordered and independent interpretation performed. Decision-making details documented in ED Course.  Risk Prescription drug management.   Pt placed in an ace wrap and post op shoe.  Pt advised to see Dr. Romeo Apple for recheck in 1 week  Rx for ibuprofen An After Visit Summary was printed and given to the patient.         Final  Clinical Impression(s) / ED Diagnoses Final diagnoses:  Closed nondisplaced fracture of fifth metatarsal bone of left foot, initial encounter    Rx / DC Orders ED Discharge Orders          Ordered    ibuprofen (ADVIL) 800 MG tablet  Every 8 hours PRN        09/19/21 1651              Osie CheeksSofia, Breydan Shillingburg K, PA-C 09/19/21 2051    Gloris Manchesterixon, Ryan, MD 09/21/21 1948

## 2021-09-21 ENCOUNTER — Encounter: Payer: Self-pay | Admitting: Orthopaedic Surgery

## 2021-09-21 ENCOUNTER — Telehealth: Payer: Self-pay | Admitting: Orthopaedic Surgery

## 2021-09-21 ENCOUNTER — Other Ambulatory Visit: Payer: Self-pay

## 2021-09-21 ENCOUNTER — Ambulatory Visit (INDEPENDENT_AMBULATORY_CARE_PROVIDER_SITE_OTHER): Payer: Medicaid Other | Admitting: Orthopaedic Surgery

## 2021-09-21 VITALS — BP 143/72 | HR 101 | Ht 67.0 in | Wt 183.0 lb

## 2021-09-21 DIAGNOSIS — S92352A Displaced fracture of fifth metatarsal bone, left foot, initial encounter for closed fracture: Secondary | ICD-10-CM

## 2021-09-21 DIAGNOSIS — S92351A Displaced fracture of fifth metatarsal bone, right foot, initial encounter for closed fracture: Secondary | ICD-10-CM

## 2021-09-21 MED ORDER — HYDROCODONE-ACETAMINOPHEN 5-325 MG PO TABS
ORAL_TABLET | ORAL | 0 refills | Status: DC
Start: 2021-09-21 — End: 2021-10-03

## 2021-09-21 NOTE — Telephone Encounter (Signed)
d 

## 2021-09-21 NOTE — Progress Notes (Signed)
Subjective:    Patient ID: Brittany Wade, female    DOB: 2000-09-11, 20 y.o.   MRN: [id number]  HPI She twisted her left foot two days ago and hurt the lateral side of the foot.  She had swelling and bruising and pain.  She was seen in the ER and X-rays were done.  She was given post op shoe.  She has pain.  She has ibuprofen which does not help the pain.  She has a 46 week old son with her.  Her grandmother is helping her also.  She is on crutches as well.  There is no other injury.  X-rays showed: IMPRESSION: 1. Minimally displaced fracture of the base of the fifth metatarsal. 2. Soft tissue swelling over the lateral malleolus. No ankle fracture identified.  I have independently reviewed and interpreted x-rays of this patient done at another site by another physician or qualified health professional.  I have reviewed ER notes.   Review of Systems  Constitutional:  Positive for activity change.  Musculoskeletal:  Positive for arthralgias, gait problem and joint swelling.  All other systems reviewed and are negative. For Review of Systems, all other systems reviewed and are negative.  The following is a summary of the past history medically, past history surgically, known current medicines, social history and family history.  This information is gathered electronically by the computer from prior information and documentation.  I review this each visit and have found including this information at this point in the chart is beneficial and informative.   Past Medical History:  Diagnosis Date   Circadian rhythm disorder 04/04/2015   Constipation    Diabetes mellitus without complication (HCC)    Migraine 05/21/2018   Tremor 04/04/2015    Past Surgical History:  Procedure Laterality Date   ADENOIDECTOMY     CESAREAN SECTION N/A 08/07/2021   Procedure: CESAREAN SECTION;  Surgeon: Milas Hock, MD;  Location: [facility] LD ORS;  Service: Obstetrics;  Laterality: N/A;   MOUTH SURGERY      TONSILLECTOMY      Current Outpatient Medications on File Prior to Visit  Medication Sig Dispense Refill   Accu-Chek Softclix Lancets lancets Use as instructed to check blood sugar 4 times daily 100 each 12   acetaminophen (TYLENOL) 325 MG tablet Take 2 tablets (650 mg total) by mouth every 6 (six) hours as needed. 60 tablet 0   albuterol (VENTOLIN HFA) 108 (90 Base) MCG/ACT inhaler USE 2 PUFFS EVERY 6 HOURS AS NEEDED 8.5 g 1   atorvastatin (LIPITOR) 40 MG tablet Take 40 mg by mouth daily.     Blood Pressure Monitor MISC For regular home bp monitoring during pregnancy 1 each 0   Ferrous Sulfate (IRON PO) Take by mouth.     furosemide (LASIX) 20 MG tablet Take 1 tablet (20 mg total) by mouth 2 (two) times daily for 4 days. 8 tablet 0   glucose blood (ACCU-CHEK GUIDE) test strip Use as instructed to check blood sugar 4 times daily 50 each 12   ibuprofen (ADVIL) 800 MG tablet Take 1 tablet (800 mg total) by mouth every 8 (eight) hours as needed. 30 tablet 0   insulin aspart (NOVOLOG) 100 UNIT/ML injection Inject 2 Units into the skin daily with supper. 10 mL 0   insulin glargine (LANTUS SOLOSTAR) 100 UNIT/ML Solostar Pen Inject 8 Units into the skin daily. 15 mL 11   Insulin Pen Needle (PEN NEEDLES) 32G X 4 MM MISC Use to inject  insulin daily 100 each 11   Lactic Ac-Citric Ac-Pot Bitart (PHEXXI) 1.8-1-0.4 % GEL Place 1 applicator vaginally as needed (up to 1 hour before sex). 60 g 3   metFORMIN (GLUCOPHAGE) 1000 MG tablet TAKE ONE TABLET TWICE A DAY WITH MEALS. 180 tablet 1   NIFEdipine (ADALAT CC) 30 MG 24 hr tablet Take 1 tablet (30 mg total) by mouth daily. 60 tablet 0   oxyCODONE (OXY IR/ROXICODONE) 5 MG immediate release tablet Take 1 tablet (5 mg total) by mouth every 4 (four) hours as needed for severe pain or breakthrough pain. 15 tablet 0   polyethylene glycol powder (GLYCOLAX/MIRALAX) 17 GM/SCOOP powder Take 17 g by mouth daily. 238 g 0   Prenatal MV & Min w/FA-DHA (PRENATAL GUMMIES PO)  Take by mouth.     No current facility-administered medications on file prior to visit.    Social History   Socioeconomic History   Marital status: Significant Other    Spouse name: Not on file   Number of children: Not on file   Years of education: Not on file   Highest education level: Not on file  Occupational History   Not on file  Tobacco Use   Smoking status: Former    Packs/day: 0.50    Years: 4.00    Pack years: 2.00    Types: Cigarettes   Smokeless tobacco: Never  Vaping Use   Vaping Use: Every day  Substance and Sexual Activity   Alcohol use: No   Drug use: No   Sexual activity: Yes    Birth control/protection: None  Other Topics Concern   Not on file  Social History Narrative   Not on file   Social Determinants of Health   Financial Resource Strain: Low Risk    Difficulty of Paying Living Expenses: Not very hard  Food Insecurity: Food Insecurity Present   Worried About Running Out of Food in the Last Year: Sometimes true   Ran Out of Food in the Last Year: Never true  Transportation Needs: No Transportation Needs   Lack of Transportation (Medical): No   Lack of Transportation (Non-Medical): No  Physical Activity: Insufficiently Active   Days of Exercise per Week: 2 days   Minutes of Exercise per Session: 60 min  Stress: No Stress Concern Present   Feeling of Stress : Only a little  Social Connections: Moderately Isolated   Frequency of Communication with Friends and Family: Three times a week   Frequency of Social Gatherings with Friends and Family: Three times a week   Attends Religious Services: Never   Active Member of Clubs or Organizations: No   Attends Engineer, structural: Never   Marital Status: Living with partner  Intimate Partner Violence: Not At Risk   Fear of Current or Ex-Partner: No   Emotionally Abused: No   Physically Abused: No   Sexually Abused: No    Family History  Problem Relation Age of Onset   Migraines  Mother    Mental illness Mother    Bipolar disorder Mother    Depression Mother    Anxiety disorder Mother    Seizures Mother    Heart disease Mother    Diabetes Mother    Asperger's syndrome Sister    ADD / ADHD Sister    Migraines Maternal Grandmother    Depression Maternal Grandmother    Anxiety disorder Maternal Grandmother    Epilepsy Other        maternal aunt   Diabetes  Other    Heart disease Other    ADD / ADHD Cousin     BP (!) 143/72    Pulse (!) 101    Ht 5\' 7"  (1.702 m)    Wt 183 lb (83 kg)    Breastfeeding No    BMI 28.66 kg/m   Body mass index is 28.66 kg/m.     Objective:   Physical Exam Vitals and nursing note reviewed. Exam conducted with a chaperone present.  Constitutional:      Appearance: She is well-developed.  HENT:     Head: Normocephalic and atraumatic.  Eyes:     Conjunctiva/sclera: Conjunctivae normal.     Pupils: Pupils are equal, round, and reactive to light.  Cardiovascular:     Rate and Rhythm: Normal rate and regular rhythm.  Pulmonary:     Effort: Pulmonary effort is normal.  Abdominal:     Palpations: Abdomen is soft.  Musculoskeletal:     Cervical back: Normal range of motion and neck supple.       Feet:  Skin:    General: Skin is warm and dry.  Neurological:     Mental Status: She is alert and oriented to person, place, and time.     Cranial Nerves: No cranial nerve deficit.     Motor: No abnormal muscle tone.     Coordination: Coordination normal.     Deep Tendon Reflexes: Reflexes are normal and symmetric. Reflexes normal.  Psychiatric:        Behavior: Behavior normal.        Thought Content: Thought content normal.        Judgment: Judgment normal.          Assessment & Plan:   Encounter Diagnosis  Name Primary?   Fracture of base of fifth metatarsal bone, right, closed, initial encounter Yes   I will give CAM walker.  Instructions for contrast baths given.  Return in two weeks.  X-rays on  return.  I have [place] the [place] web site prior to prescribing narcotic medicine for this patient.  Call if any problem.  Precautions discussed.  Electronically Signed Darreld McleanWayne Avaley Coop, MD 1/19/202311:25 AM

## 2021-09-21 NOTE — Telephone Encounter (Signed)
Patient called back after today's visit to follow up as to whether medication was there something to be prescribed? States pharmacy does not have anything from our office?

## 2021-10-02 ENCOUNTER — Other Ambulatory Visit: Payer: Self-pay | Admitting: Orthopaedic Surgery

## 2021-10-03 ENCOUNTER — Other Ambulatory Visit: Payer: Self-pay | Admitting: Orthopedic Surgery

## 2021-10-03 MED ORDER — HYDROCODONE-ACETAMINOPHEN 5-325 MG PO TABS: ORAL_TABLET | ORAL | 0 refills | Status: DC

## 2021-10-03 NOTE — Progress Notes (Signed)
Meds ordered this encounter  Medications   HYDROcodone-acetaminophen (NORCO/VICODIN) 5-325 MG tablet    Sig: One tablet every four hours as needed for acute pain.  Limit of five days per [place].    Dispense:  30 tablet    Refill:  0

## 2021-10-05 ENCOUNTER — Encounter: Payer: Medicaid Other | Admitting: Orthopaedic Surgery

## 2021-10-12 ENCOUNTER — Encounter: Payer: Medicaid Other | Admitting: Orthopaedic Surgery

## 2021-10-12 ENCOUNTER — Encounter: Payer: Self-pay | Admitting: Orthopaedic Surgery

## 2021-10-17 ENCOUNTER — Encounter: Payer: Medicaid Other | Admitting: Orthopaedic Surgery

## 2021-11-08 ENCOUNTER — Other Ambulatory Visit: Payer: Self-pay | Admitting: Family Medicine

## 2021-11-08 DIAGNOSIS — E118 Type 2 diabetes mellitus with unspecified complications: Secondary | ICD-10-CM

## 2021-12-14 ENCOUNTER — Other Ambulatory Visit: Payer: Self-pay | Admitting: Family Medicine

## 2021-12-14 DIAGNOSIS — E118 Type 2 diabetes mellitus with unspecified complications: Secondary | ICD-10-CM

## 2022-02-28 ENCOUNTER — Encounter: Payer: Self-pay | Admitting: Family Medicine

## 2022-02-28 ENCOUNTER — Ambulatory Visit: Payer: Medicaid Other | Admitting: Family Medicine

## 2022-02-28 ENCOUNTER — Ambulatory Visit: Payer: Self-pay | Admitting: Family Medicine

## 2022-02-28 VITALS — BP 118/82 | HR 92 | Temp 98.0°F | Ht 67.0 in | Wt 187.0 lb

## 2022-02-28 DIAGNOSIS — R768 Other specified abnormal immunological findings in serum: Secondary | ICD-10-CM

## 2022-02-28 DIAGNOSIS — E109 Type 1 diabetes mellitus without complications: Secondary | ICD-10-CM | POA: Diagnosis not present

## 2022-02-28 LAB — BAYER DCA HB A1C WAIVED: HB A1C (BAYER DCA - WAIVED): 7.7 % — ABNORMAL HIGH (ref 4.8–5.6)

## 2022-02-28 MED ORDER — INSULIN REGULAR HUMAN 100 UNIT/ML IJ SOLN: 1.0000 [IU] | Freq: Two times a day (BID) | INTRAMUSCULAR | 11 refills | Status: DC

## 2022-02-28 MED ORDER — LISINOPRIL 5 MG PO TABS: 5.0000 mg | ORAL_TABLET | Freq: Every day | ORAL | 3 refills | Status: DC

## 2022-02-28 NOTE — Progress Notes (Signed)
Subjective:  Patient ID: Brittany Wade, female    DOB: 2001/02/27, 21 y.o.   MRN: [id number]  Patient Care Team: Loman Brooklyn, FNP as PCP - General (Family Medicine)   Chief Complaint:  Diabetes (Recent high readings)   HPI: Brittany Wade is a 21 y.o. female presenting on 02/28/2022 for Diabetes (Recent high readings)   Pt presents today with complaints of continued elevated blood sugar readings despite increasing metformin and lantus doses. She reports readings in the mid 200 - 300 range over the last several days. She denies polyuria, polyphagia, or polydipsia. Denies visual changes, weakness, confusion, diaphoresis, or fatigue. She states she was diagnosed with T2DM at age 86, then told she had T1DM at one visit. States her diagnosis has changes multiple times and most recent on chart is T2DM. Her GAD 65 was positive. She has not seen endocrinology in several years. Her OB has been managing her diabetes during pregnancy. No recent A1C noted in chart. Reports she has been dosing her nightly lantus dose based on blood sugars, taking anywhere from 8-20 units per night.       Relevant past medical, surgical, family, and social history reviewed and updated as indicated.  Allergies and medications reviewed and updated. Data reviewed: Chart in Epic.   Past Medical History:  Diagnosis Date   Circadian rhythm disorder 04/04/2015   Constipation    Diabetes mellitus without complication ([place])    Migraine 05/21/2018   Tremor 04/04/2015    Past Surgical History:  Procedure Laterality Date   ADENOIDECTOMY     CESAREAN SECTION N/A 08/07/2021   Procedure: CESAREAN SECTION;  Surgeon: Radene Gunning, MD;  Location: [facility];  Service: Obstetrics;  Laterality: N/A;   MOUTH SURGERY     TONSILLECTOMY      Social History   Socioeconomic History   Marital status: Significant Other    Spouse name: Not on file   Number of children: Not on file   Years of education: Not on file    Highest education level: Not on file  Occupational History   Not on file  Tobacco Use   Smoking status: Former    Packs/day: 0.50    Years: 4.00    Total pack years: 2.00    Types: Cigarettes   Smokeless tobacco: Never  Vaping Use   Vaping Use: Every day  Substance and Sexual Activity   Alcohol use: No   Drug use: No   Sexual activity: Yes    Birth control/protection: None  Other Topics Concern   Not on file  Social History Narrative   Not on file   Social Determinants of Health   Financial Resource Strain: Low Risk  (05/22/2021)   Overall Financial Resource Strain (CARDIA)    Difficulty of Paying Living Expenses: Not very hard  Food Insecurity: Food Insecurity Present (05/22/2021)   Hunger Vital Sign    Worried About [place] in the Last Year: Sometimes true    Ran Out of Food in the Last Year: Never true  Transportation Needs: No Transportation Needs (05/22/2021)   PRAPARE - Hydrologist (Medical): No    Lack of Transportation (Non-Medical): No  Physical Activity: Insufficiently Active (05/22/2021)   Exercise Vital Sign    Days of Exercise per Week: 2 days    Minutes of Exercise per Session: 60 min  Stress: No Stress Concern Present (05/22/2021)   [place] -  Occupational Stress Questionnaire    Feeling of Stress : Only a little  Social Connections: Moderately Isolated (05/22/2021)   Social Connection and Isolation Panel [NHANES]    Frequency of Communication with Friends and Family: Three times a week    Frequency of Social Gatherings with Friends and Family: Three times a week    Attends Religious Services: Never    Active Member of Clubs or Organizations: No    Attends Archivist Meetings: Never    Marital Status: Living with partner  Intimate Partner Violence: Not At Risk (05/22/2021)   Humiliation, Afraid, Rape, and Kick questionnaire    Fear of Current or Ex-Partner: No    Emotionally  Abused: No    Physically Abused: No    Sexually Abused: No    Outpatient Encounter Medications as of 02/28/2022  Medication Sig   Accu-Chek Softclix Lancets lancets Use as instructed to check blood sugar 4 times daily   albuterol (VENTOLIN HFA) 108 (90 Base) MCG/ACT inhaler USE 2 PUFFS EVERY 6 HOURS AS NEEDED   atorvastatin (LIPITOR) 40 MG tablet Take 40 mg by mouth daily.   Blood Pressure Monitor MISC For regular home bp monitoring during pregnancy   glucose blood (ACCU-CHEK GUIDE) test strip Use as instructed to check blood sugar 4 times daily   insulin glargine (LANTUS SOLOSTAR) 100 UNIT/ML Solostar Pen Inject 8 Units into the skin daily.   Insulin Pen Needle (PEN NEEDLES) 32G X 4 MM MISC Use to inject insulin daily   insulin regular (HUMULIN R) 100 units/mL injection Inject 0.01 mLs (1 Units total) into the skin 2 (two) times daily before a meal. Sliding scale, 30 minutes before meal: < 150: 0 units 150-199:1 unit 200-249: 2 units 250-299: 3 units 300-349: 4 units >/= 350: 5 units   lisinopril (ZESTRIL) 5 MG tablet Take 1 tablet (5 mg total) by mouth daily.   [DISCONTINUED] metFORMIN (GLUCOPHAGE) 1000 MG tablet TAKE ONE TABLET TWICE A DAY WITH MEALS.   furosemide (LASIX) 20 MG tablet Take 1 tablet (20 mg total) by mouth 2 (two) times daily for 4 days.   [DISCONTINUED] Ferrous Sulfate (IRON PO) Take by mouth.   [DISCONTINUED] HYDROcodone-acetaminophen (NORCO/VICODIN) 5-325 MG tablet One tablet every four hours as needed for acute pain.  Limit of five days per [place].   [DISCONTINUED] ibuprofen (ADVIL) 800 MG tablet Take 1 tablet (800 mg total) by mouth every 8 (eight) hours as needed.   [DISCONTINUED] insulin aspart (NOVOLOG) 100 UNIT/ML injection Inject 2 Units into the skin daily with supper.   [DISCONTINUED] Lactic Ac-Citric Ac-Pot Bitart (PHEXXI) 1.8-1-0.4 % GEL Place 1 applicator vaginally as needed (up to 1 hour before sex). (Patient not taking: Reported on 02/28/2022)    [DISCONTINUED] NIFEdipine (ADALAT CC) 30 MG 24 hr tablet Take 1 tablet (30 mg total) by mouth daily.   [DISCONTINUED] oxyCODONE (OXY IR/ROXICODONE) 5 MG immediate release tablet Take 1 tablet (5 mg total) by mouth every 4 (four) hours as needed for severe pain or breakthrough pain.   [DISCONTINUED] polyethylene glycol powder (GLYCOLAX/MIRALAX) 17 GM/SCOOP powder Take 17 g by mouth daily.   [DISCONTINUED] Prenatal MV & Min w/FA-DHA (PRENATAL GUMMIES PO) Take by mouth.   No facility-administered encounter medications on file as of 02/28/2022.    Allergies  Allergen Reactions   Red Dye Hives and Rash   Codeine Hives    Review of Systems  Constitutional:  Negative for activity change, appetite change, chills, diaphoresis, fever and unexpected weight  change.  HENT: Negative.    Eyes: Negative.  Negative for photophobia and visual disturbance.  Respiratory:  Negative for cough, chest tightness and shortness of breath.   Cardiovascular:  Negative for palpitations and leg swelling.  Gastrointestinal:  Negative for abdominal pain, blood in stool, constipation, diarrhea, nausea and vomiting.  Endocrine: Negative.   Genitourinary:  Negative for decreased urine volume, difficulty urinating, dysuria, frequency and urgency.  Musculoskeletal:  Negative for arthralgias and myalgias.  Skin: Negative.   Allergic/Immunologic: Negative.   Neurological:  Negative for syncope, facial asymmetry, light-headedness and numbness.  Hematological: Negative.   Psychiatric/Behavioral:  Negative for hallucinations, sleep disturbance and suicidal ideas.   All other systems reviewed and are negative.       Objective:  BP 118/82   Pulse 92   Temp 98 F (36.7 C)   Ht '5\' 7"'  (1.702 m)   Wt 187 lb (84.8 kg)   LMP 01/29/2022 (Exact Date)   SpO2 98%   BMI 29.29 kg/m    Wt Readings from Last 3 Encounters:  02/28/22 187 lb (84.8 kg)  09/21/21 183 lb (83 kg)  09/08/21 183 lb (83 kg)    Physical Exam Vitals  and nursing note reviewed.  Constitutional:      General: She is not in acute distress.    Appearance: Normal appearance. She is well-developed and well-groomed. She is not ill-appearing, toxic-appearing or diaphoretic.  HENT:     Head: Normocephalic and atraumatic.     Jaw: There is normal jaw occlusion.     Right Ear: Hearing normal.     Left Ear: Hearing normal.     Nose: Nose normal.     Mouth/Throat:     Lips: Pink.     Mouth: Mucous membranes are moist.     Pharynx: Oropharynx is clear. Uvula midline.  Eyes:     General: Lids are normal.     Extraocular Movements: Extraocular movements intact.     Conjunctiva/sclera: Conjunctivae normal.     Pupils: Pupils are equal, round, and reactive to light.  Neck:     Thyroid: No thyroid mass, thyromegaly or thyroid tenderness.     Vascular: No carotid bruit or JVD.     Trachea: Trachea and phonation normal.  Cardiovascular:     Rate and Rhythm: Normal rate and regular rhythm.     Chest Wall: PMI is not displaced.     Pulses: Normal pulses.     Heart sounds: Normal heart sounds. No murmur heard.    No friction rub. No gallop.  Pulmonary:     Effort: Pulmonary effort is normal. No respiratory distress.     Breath sounds: Normal breath sounds. No wheezing.  Abdominal:     General: Bowel sounds are normal. There is no distension or abdominal bruit.     Palpations: Abdomen is soft. There is no hepatomegaly or splenomegaly.     Tenderness: There is no abdominal tenderness. There is no right CVA tenderness or left CVA tenderness.     Hernia: No hernia is present.  Musculoskeletal:        General: Normal range of motion.     Cervical back: Normal range of motion and neck supple.     Right lower leg: No edema.     Left lower leg: No edema.  Lymphadenopathy:     Cervical: No cervical adenopathy.  Skin:    General: Skin is warm and dry.     Capillary Refill: Capillary refill takes less  than 2 seconds.     Coloration: Skin is not  cyanotic, jaundiced or pale.     Findings: No rash.  Neurological:     General: No focal deficit present.     Mental Status: She is alert and oriented to person, place, and time.     Sensory: Sensation is intact.     Motor: Motor function is intact.     Coordination: Coordination is intact.     Gait: Gait is intact.     Deep Tendon Reflexes: Reflexes are normal and symmetric.  Psychiatric:        Attention and Perception: Attention and perception normal.        Mood and Affect: Mood and affect normal.        Speech: Speech normal.        Behavior: Behavior normal. Behavior is cooperative.        Thought Content: Thought content normal.        Cognition and Memory: Cognition and memory normal.        Judgment: Judgment normal.     Results for orders placed or performed during the hospital encounter of 08/05/21  Resp Panel by RT-PCR (Flu A&B, Covid) Nasopharyngeal Swab   Specimen: Nasopharyngeal Swab; Nasopharyngeal(NP) swabs in vial transport medium  Result Value Ref Range   SARS Coronavirus 2 by RT PCR NEGATIVE NEGATIVE   Influenza A by PCR NEGATIVE NEGATIVE   Influenza B by PCR NEGATIVE NEGATIVE  CBC  Result Value Ref Range   WBC 20.2 (H) 4.0 - 10.5 K/uL   RBC 3.32 (L) 3.87 - 5.11 MIL/uL   Hemoglobin 7.5 (L) 12.0 - 15.0 g/dL   HCT 26.0 (L) 36.0 - 46.0 %   MCV 78.3 (L) 80.0 - 100.0 fL   MCH 22.6 (L) 26.0 - 34.0 pg   MCHC 28.8 (L) 30.0 - 36.0 g/dL   RDW 20.0 (H) 11.5 - 15.5 %   Platelets 309 150 - 400 K/uL   nRBC 0.1 0.0 - 0.2 %  RPR  Result Value Ref Range   RPR Ser Ql NON REACTIVE NON REACTIVE  Glucose, capillary  Result Value Ref Range   Glucose-Capillary 87 70 - 99 mg/dL  Glucose, capillary  Result Value Ref Range   Glucose-Capillary 97 70 - 99 mg/dL  Glucose, capillary  Result Value Ref Range   Glucose-Capillary 72 70 - 99 mg/dL  CBC  Result Value Ref Range   WBC 17.4 (H) 4.0 - 10.5 K/uL   RBC 3.28 (L) 3.87 - 5.11 MIL/uL   Hemoglobin 7.5 (L) 12.0 - 15.0  g/dL   HCT 25.7 (L) 36.0 - 46.0 %   MCV 78.4 (L) 80.0 - 100.0 fL   MCH 22.9 (L) 26.0 - 34.0 pg   MCHC 29.2 (L) 30.0 - 36.0 g/dL   RDW 20.5 (H) 11.5 - 15.5 %   Platelets 266 150 - 400 K/uL   nRBC 0.1 0.0 - 0.2 %  Comprehensive metabolic panel  Result Value Ref Range   Sodium 136 135 - 145 mmol/L   Potassium 4.0 3.5 - 5.1 mmol/L   Chloride 107 98 - 111 mmol/L   CO2 21 (L) 22 - 32 mmol/L   Glucose, Bld 87 70 - 99 mg/dL   BUN 8 6 - 20 mg/dL   Creatinine, Ser 0.63 0.44 - 1.00 mg/dL   Calcium 8.4 (L) 8.9 - 10.3 mg/dL   Total Protein 5.1 (L) 6.5 - 8.1 g/dL   Albumin 2.2 (L) 3.5 -  5.0 g/dL   AST 16 15 - 41 U/L   ALT 9 0 - 44 U/L   Alkaline Phosphatase 122 38 - 126 U/L   Total Bilirubin <0.1 (L) 0.3 - 1.2 mg/dL   GFR, Estimated >60 >60 mL/min   Anion gap 8 5 - 15  Glucose, capillary  Result Value Ref Range   Glucose-Capillary 79 70 - 99 mg/dL  Glucose, capillary  Result Value Ref Range   Glucose-Capillary 82 70 - 99 mg/dL  Glucose, capillary  Result Value Ref Range   Glucose-Capillary 74 70 - 99 mg/dL  Glucose, capillary  Result Value Ref Range   Glucose-Capillary 94 70 - 99 mg/dL  OB RESULTS CONSOLE GC/Chlamydia  Result Value Ref Range   Gonorrhea Negative   Glucose, capillary  Result Value Ref Range   Glucose-Capillary 90 70 - 99 mg/dL  Glucose, capillary  Result Value Ref Range   Glucose-Capillary 136 (H) 70 - 99 mg/dL  Glucose, capillary  Result Value Ref Range   Glucose-Capillary 137 (H) 70 - 99 mg/dL  Glucose, capillary  Result Value Ref Range   Glucose-Capillary 200 (H) 70 - 99 mg/dL  CBC  Result Value Ref Range   WBC 23.7 (H) 4.0 - 10.5 K/uL   RBC 2.29 (L) 3.87 - 5.11 MIL/uL   Hemoglobin 5.4 (LL) 12.0 - 15.0 g/dL   HCT 17.8 (L) 36.0 - 46.0 %   MCV 77.7 (L) 80.0 - 100.0 fL   MCH 23.6 (L) 26.0 - 34.0 pg   MCHC 30.3 30.0 - 36.0 g/dL   RDW 21.1 (H) 11.5 - 15.5 %   Platelets 218 150 - 400 K/uL   nRBC 0.0 0.0 - 0.2 %  Glucose, capillary  Result Value Ref  Range   Glucose-Capillary 136 (H) 70 - 99 mg/dL  Glucose, capillary  Result Value Ref Range   Glucose-Capillary 132 (H) 70 - 99 mg/dL  Glucose, capillary  Result Value Ref Range   Glucose-Capillary 109 (H) 70 - 99 mg/dL  Hemoglobin and hematocrit, blood  Result Value Ref Range   Hemoglobin 8.1 (L) 12.0 - 15.0 g/dL   HCT 25.3 (L) 36.0 - 46.0 %  Glucose, capillary  Result Value Ref Range   Glucose-Capillary 125 (H) 70 - 99 mg/dL  Glucose, capillary  Result Value Ref Range   Glucose-Capillary 180 (H) 70 - 99 mg/dL  Glucose, capillary  Result Value Ref Range   Glucose-Capillary 111 (H) 70 - 99 mg/dL  Glucose, capillary  Result Value Ref Range   Glucose-Capillary 85 70 - 99 mg/dL  Glucose, capillary  Result Value Ref Range   Glucose-Capillary 155 (H) 70 - 99 mg/dL  Type and screen [place]  Result Value Ref Range   ABO/RH(D) A POS    Antibody Screen NEG    Sample Expiration 08/08/2021,2359    Unit Number [id number]    Blood Component Type RED CELLS,LR    Unit division 00    Status of Unit ISSUED,FINAL    Transfusion Status OK TO TRANSFUSE    Crossmatch Result      Compatible Performed at [facility], [address] [place], [place] [address]    Unit Number [id number]    Blood Component Type RED CELLS,LR    Unit division 00    Status of Unit ISSUED,FINAL    Transfusion Status OK TO TRANSFUSE    Crossmatch Result Compatible   Prepare RBC (crossmatch)  Result Value Ref Range   Order Confirmation  ORDER PROCESSED BY BLOOD BANK Performed at [facility], [place] [address] [place], [place] [address]   ABO/Rh  Result Value Ref Range   ABO/RH(D)      A POS Performed at [facility] [address] [place], [place] [address]   Surgical pathology  Result Value Ref Range   SURGICAL PATHOLOGY      SURGICAL PATHOLOGY CASE: [id number] PATIENT: Brittany Wade Surgical Pathology Report     Clinical History: 39w 4d  (crm)     FINAL MICROSCOPIC DIAGNOSIS:  A. PLACENTA, SINGLETON, DELIVERY: - 509-gram singleton placenta (25-50th percentile by weight for reported gestational age). - 3-vessel umbilical cord with marginal insertion, negative for funisitis. - Membranes with acute chorioamnionitis, stage 2 of 3. - Placental disc with villous maturation appropriate for gestational age, negative for villitis. - Focal infarct, less than 5% of total area.   GROSS DESCRIPTION:  A. Specimen received: fresh and subsequently placed in formalin labeled with the patient's name and DOB is a singleton placenta. Size and shape: discoid, 16.4 x 15.9 x 2.7 cm. Umbilical cord: marginally inserted at the edge and received in two pieces - 41.6 x 1.0 cm. Slightly yellow tinged and contains 3 vessels grossly. Membranes: green-tan, semitranslucent, and slimy. Weight: 509 Fet al surface: blue-green with characteristic vasculature. Maternal surface: intact with mild cotyledons. Cut surfaces: red-brown and spongy with solid, white-tan, rubbery areas grossly consistent with infarct comprising <5% of the parenchyma. Block summary: 1: proximal and distal cord and membrane roll 2: peripheral disc near cord insertion 3: central disc  (LEF 08/08/2021)   Final Diagnosis performed by Betsy Pries, MD.   Electronically signed 08/09/2021 Technical component performed at [facility], [place] [address] [place], [place] [address].  Professional component performed at [facility]. [address], [place], [place] [address]  Immunohistochemistry Technical component (if applicable) was performed at [facility]. [address] [place], [place], [place] [address].  IMMUNOHISTOCHEMISTRY DISCLAIMER (if applicable): Some of these immunohistochemical stains may have been developed and the perfo rmance characteristics determine by [facility]. Some may  not have been cleared or approved by the U.S. Food and Drug Administration. The FDA has determined that such clearance or approval is not necessary. This test is used for clinical purposes. It should not be regarded as investigational or for research. This laboratory is certified under the [place] (CLIA-88) as qualified to perform high complexity clinical laboratory testing.  The controls stained appropriately.   BPAM RBC  Result Value Ref Range   ISSUE DATE / TIME [id number]    Blood Product Unit Number [id number]    PRODUCT CODE [id number]    Unit Type and Rh 6200    Blood Product Expiration Date 601093235573    ISSUE DATE / TIME 220254270623    Blood Product Unit Number [id number]    PRODUCT CODE [id number]    Unit Type and Rh 6200    Blood Product Expiration Date [id number]        Pertinent labs & imaging results that were available during my care of the patient were reviewed by me and considered in my medical decision making.  Assessment & Plan:  Perlie was seen today for diabetes.  Diagnoses and all orders for this visit:  Type 1 diabetes mellitus without complications ([place]) GAD 65 positive Pt has continued elevated blood sugar readings despite increasing her lantus dosing according to blood sugar levels. She has  been taking extra metformin without resolve of elevated readings. A1C 7.7 in office today. EHR review reveals a positive GAD 65. She was followed by endo in the past but does not wish to return to previous provider. Will refer to new endo today. Will start SS mealtime insulin coverage twice daily. Stop metformin. Not on ACEi or ARB, will start low dose lisinopril today. Pt to monitor blood sugar more frequently and keep log. Red flags discussed in detail. Would benefit from insulin pump and continuous blood glucose monitoring, would like to discuss this further with endo. Follow up with PCP in 3 months. Discussed  importance of endocrinology follow up.  -     CBC with Differential/Platelet -     BMP8+EGFR -     Bayer DCA Hb A1c Waived -     lisinopril (ZESTRIL) 5 MG tablet; Take 1 tablet (5 mg total) by mouth daily. -     Ambulatory referral to Endocrinology -     insulin regular (HUMULIN R) 100 units/mL injection; Inject 0.01 mLs (1 Units total) into the skin 2 (two) times daily before a meal. Sliding scale, 30 minutes before meal: < 150: 0 units 150-199:1 unit 200-249: 2 units 250-299: 3 units 300-349: 4 units >/= 350: 5 units     Continue all other maintenance medications.  Follow up plan: Return in about 3 months (around 05/31/2022), or if symptoms worsen or fail to improve, for DM.    The above assessment and management plan was discussed with the patient. The patient verbalized understanding of and has agreed to the management plan. Patient is aware to call the clinic if they develop any new symptoms or if symptoms persist or worsen. Patient is aware when to return to the clinic for a follow-up visit. Patient educated on when it is appropriate to go to the emergency department.   Monia Pouch, FNP-C [facility] [phone]

## 2022-03-01 LAB — BMP8+EGFR
BUN/Creatinine Ratio: 9 (ref 9–23)
BUN: 5 mg/dL — ABNORMAL LOW (ref 6–20)
CO2: 22 mmol/L (ref 20–29)
Calcium: 10 mg/dL (ref 8.7–10.2)
Chloride: 104 mmol/L (ref 96–106)
Creatinine, Ser: 0.57 mg/dL (ref 0.57–1.00)
Glucose: 178 mg/dL — ABNORMAL HIGH (ref 70–99)
Potassium: 4.8 mmol/L (ref 3.5–5.2)
Sodium: 140 mmol/L (ref 134–144)
eGFR: 132 mL/min/{1.73_m2} (ref >59)

## 2022-03-01 LAB — CBC WITH DIFFERENTIAL/PLATELET
Basophils Absolute: 0.1 10*3/uL (ref 0.0–0.2)
Basos: 1 %
EOS (ABSOLUTE): 0.4 10*3/uL (ref 0.0–0.4)
Eos: 3 %
Hematocrit: 37.5 % (ref 34.0–46.6)
Hemoglobin: 12.2 g/dL (ref 11.1–15.9)
Immature Grans (Abs): 0 10*3/uL (ref 0.0–0.1)
Immature Granulocytes: 0 %
Lymphocytes Absolute: 3.6 10*3/uL — ABNORMAL HIGH (ref 0.7–3.1)
Lymphs: 34 %
MCH: 26.4 pg — ABNORMAL LOW (ref 26.6–33.0)
MCHC: 32.5 g/dL (ref 31.5–35.7)
MCV: 81 fL (ref 79–97)
Monocytes Absolute: 0.7 10*3/uL (ref 0.1–0.9)
Monocytes: 7 %
Neutrophils Absolute: 5.9 10*3/uL (ref 1.4–7.0)
Neutrophils: 55 %
Platelets: 419 10*3/uL (ref 150–450)
RBC: 4.62 x10E6/uL (ref 3.77–5.28)
RDW: 14.9 % (ref 11.7–15.4)
WBC: 10.6 10*3/uL (ref 3.4–10.8)

## 2022-03-13 ENCOUNTER — Ambulatory Visit: Payer: Medicaid Other | Admitting: Obstetrics & Gynecology

## 2022-03-20 ENCOUNTER — Telehealth: Payer: Self-pay | Admitting: *Deleted

## 2022-03-20 ENCOUNTER — Encounter: Payer: Self-pay | Admitting: Family Medicine

## 2022-03-20 ENCOUNTER — Other Ambulatory Visit: Payer: Self-pay | Admitting: Family Medicine

## 2022-03-20 DIAGNOSIS — L0291 Cutaneous abscess, unspecified: Secondary | ICD-10-CM

## 2022-03-20 MED ORDER — CLINDAMYCIN PHOSPHATE 1 % EX GEL: Freq: Two times a day (BID) | CUTANEOUS | 0 refills | Status: DC

## 2022-03-20 MED ORDER — MUPIROCIN 2 % EX OINT: 1.0000 | TOPICAL_OINTMENT | Freq: Two times a day (BID) | CUTANEOUS | 0 refills | Status: DC

## 2022-03-20 NOTE — Addendum Note (Signed)
Addended by: Sonny Masters on: 03/20/2022 11:27 AM   Modules accepted: Orders

## 2022-03-20 NOTE — Telephone Encounter (Signed)
Clindamycin Phosphate Gel not covered  Preferred Topicals for Medicaid are  Clindamycin Phosphate Plegets/Solution(acne med?) Gentamicin cream/ointment Mupirocin ointment   Please advise

## 2022-04-09 ENCOUNTER — Encounter: Payer: Self-pay | Admitting: *Deleted

## 2022-05-11 ENCOUNTER — Ambulatory Visit: Payer: Medicaid Other | Admitting: Obstetrics & Gynecology

## 2022-06-05 ENCOUNTER — Ambulatory Visit: Payer: Medicaid Other | Admitting: Family Medicine

## 2022-06-05 ENCOUNTER — Encounter: Payer: Self-pay | Admitting: Family Medicine

## 2022-06-18 ENCOUNTER — Other Ambulatory Visit: Payer: Self-pay | Admitting: Family Medicine

## 2022-06-18 ENCOUNTER — Other Ambulatory Visit: Payer: Self-pay | Admitting: Women's Health

## 2022-06-18 DIAGNOSIS — E118 Type 2 diabetes mellitus with unspecified complications: Secondary | ICD-10-CM

## 2022-06-19 ENCOUNTER — Encounter: Payer: Self-pay | Admitting: Obstetrics & Gynecology

## 2022-06-19 ENCOUNTER — Ambulatory Visit: Payer: Medicaid Other | Admitting: Obstetrics & Gynecology

## 2022-07-04 ENCOUNTER — Other Ambulatory Visit: Payer: Self-pay | Admitting: Obstetrics & Gynecology

## 2022-07-04 ENCOUNTER — Encounter: Payer: Self-pay | Admitting: Family Medicine

## 2022-07-04 MED ORDER — "TRUEPLUS INSULIN SYRINGE 31G X 5/16"" 0.5 ML MISC": 3 refills | Status: DC

## 2022-07-10 ENCOUNTER — Encounter: Payer: Self-pay | Admitting: Obstetrics & Gynecology

## 2022-07-10 ENCOUNTER — Other Ambulatory Visit (HOSPITAL_COMMUNITY)
Admission: RE | Admit: 2022-07-10 | Discharge: 2022-07-10 | Disposition: A | Discharge status: Home / Self Care | Payer: Medicaid Other | Source: Ambulatory Visit | Attending: Obstetrics & Gynecology | Admitting: Obstetrics & Gynecology

## 2022-07-10 ENCOUNTER — Ambulatory Visit (INDEPENDENT_AMBULATORY_CARE_PROVIDER_SITE_OTHER): Payer: Medicaid Other | Admitting: Obstetrics & Gynecology

## 2022-07-10 VITALS — BP 119/69 | HR 102 | Ht 67.0 in | Wt 180.0 lb

## 2022-07-10 DIAGNOSIS — Z01419 Encounter for gynecological examination (general) (routine) without abnormal findings: Secondary | ICD-10-CM | POA: Diagnosis present

## 2022-07-10 DIAGNOSIS — Z0000 Encounter for general adult medical examination without abnormal findings: Secondary | ICD-10-CM

## 2022-07-10 DIAGNOSIS — B3731 Acute candidiasis of vulva and vagina: Secondary | ICD-10-CM | POA: Diagnosis not present

## 2022-07-10 MED ORDER — FLUCONAZOLE 100 MG PO TABS: 100.0000 mg | ORAL_TABLET | Freq: Every day | ORAL | 0 refills | Status: DC

## 2022-07-10 NOTE — Progress Notes (Signed)
Subjective:     Brittany Wade is a 21 y.o. female here for a routine exam.  Patient's last menstrual period was 06/18/2022. G1P1001 Birth Control Method:  condoms Menstrual Calendar(currently): regular   Current complaints: none.   Current acute medical issues:  diabetes   Recent Gynecologic History Patient's last menstrual period was 06/18/2022. Last Pap: 6/22,  LSIL Last mammogram: na,    Past Medical History:  Diagnosis Date   Circadian rhythm disorder 04/04/2015   Constipation    Diabetes mellitus without complication ([place])    Migraine 05/21/2018   Tremor 04/04/2015    Past Surgical History:  Procedure Laterality Date   ADENOIDECTOMY     CESAREAN SECTION N/A 08/07/2021   Procedure: CESAREAN SECTION;  Surgeon: Radene Gunning, MD;  Location: [facility];  Service: Obstetrics;  Laterality: N/A;   MOUTH SURGERY     TONSILLECTOMY      OB History     Gravida  1   Para  1   Term  1   Preterm  0   AB  0   Living  1      SAB  0   IAB  0   Ectopic  0   Multiple  0   Live Births  1           Social History   Socioeconomic History   Marital status: Significant Other    Spouse name: Not on file   Number of children: Not on file   Years of education: Not on file   Highest education level: Not on file  Occupational History   Not on file  Tobacco Use   Smoking status: Former    Packs/day: 0.50    Years: 4.00    Total pack years: 2.00    Types: Cigarettes   Smokeless tobacco: Never  Vaping Use   Vaping Use: Every day  Substance and Sexual Activity   Alcohol use: No   Drug use: No   Sexual activity: Yes    Birth control/protection: None, Condom  Other Topics Concern   Not on file  Social History Narrative   Not on file   Social Determinants of Health   Financial Resource Strain: Low Risk  (07/10/2022)   Overall Financial Resource Strain (CARDIA)    Difficulty of Paying Living Expenses: Not very hard  Food Insecurity: Food Insecurity Present  (07/10/2022)   Hunger Vital Sign    Worried About Running Out of Food in the Last Year: Never true    [place] in the Last Year: Sometimes true  Transportation Needs: No Transportation Needs (07/10/2022)   PRAPARE - Hydrologist (Medical): No    Lack of Transportation (Non-Medical): No  Physical Activity: Sufficiently Active (07/10/2022)   Exercise Vital Sign    Days of Exercise per Week: 3 days    Minutes of Exercise per Session: 60 min  Stress: No Stress Concern Present (07/10/2022)   [place]    Feeling of Stress : Only a little  Social Connections: Moderately Isolated (07/10/2022)   Social Connection and Isolation Panel [NHANES]    Frequency of Communication with Friends and Family: Three times a week    Frequency of Social Gatherings with Friends and Family: Three times a week    Attends Religious Services: Never    Active Member of Clubs or Organizations: No    Attends Archivist Meetings:  Never    Marital Status: Living with partner    Family History  Problem Relation Age of Onset   Migraines Mother    Mental illness Mother    Bipolar disorder Mother    Depression Mother    Anxiety disorder Mother    Seizures Mother    Heart disease Mother    Diabetes Mother    Asperger's syndrome Sister    ADD / ADHD Sister    Migraines Maternal Grandmother    Depression Maternal Grandmother    Anxiety disorder Maternal Grandmother    Epilepsy Other        maternal aunt   Diabetes Other    Heart disease Other    ADD / ADHD Cousin      Current Outpatient Medications:    Accu-Chek Softclix Lancets lancets, Use as instructed to check blood sugar 4 times daily, Disp: 100 each, Rfl: 12   albuterol (VENTOLIN HFA) 108 (90 Base) MCG/ACT inhaler, USE 2 PUFFS EVERY 6 HOURS AS NEEDED, Disp: 8.5 g, Rfl: 1   atorvastatin (LIPITOR) 40 MG tablet, Take 40 mg by mouth daily., Disp: ,  Rfl:    Blood Pressure Monitor MISC, For regular home bp monitoring during pregnancy, Disp: 1 each, Rfl: 0   fluconazole (DIFLUCAN) 100 MG tablet, Take 1 tablet (100 mg total) by mouth daily., Disp: 7 tablet, Rfl: 0   glucose blood (ACCU-CHEK GUIDE) test strip, USE AS DIRECTED TO CHECK BLOOD SUGAR 4 TIMES DAILY, Disp: 50 each, Rfl: 7   insulin glargine (LANTUS SOLOSTAR) 100 UNIT/ML Solostar Pen, Inject 8 Units into the skin daily., Disp: 15 mL, Rfl: 11   Insulin Pen Needle (PEN NEEDLES) 32G X 4 MM MISC, Use to inject insulin daily, Disp: 100 each, Rfl: 11   insulin regular (HUMULIN R) 100 units/mL injection, Inject 0.01 mLs (1 Units total) into the skin 2 (two) times daily before a meal. Sliding scale, 30 minutes before meal: < 150: 0 units 150-199:1 unit 200-249: 2 units 250-299: 3 units 300-349: 4 units >/= 350: 5 units, Disp: 10 mL, Rfl: 11   lisinopril (ZESTRIL) 5 MG tablet, Take 1 tablet (5 mg total) by mouth daily., Disp: 90 tablet, Rfl: 3   TRUEPLUS INSULIN SYRINGE 31G X 5/16" 0.5 ML MISC, Use as directed, Disp: 100 each, Rfl: 3   furosemide (LASIX) 20 MG tablet, Take 1 tablet (20 mg total) by mouth 2 (two) times daily for 4 days., Disp: 8 tablet, Rfl: 0   ibuprofen (ADVIL) 800 MG tablet, Take by mouth. (Patient not taking: Reported on 07/10/2022), Disp: , Rfl:    mupirocin ointment (BACTROBAN) 2 %, Apply 1 Application topically 2 (two) times daily. (Patient not taking: Reported on 07/10/2022), Disp: 22 g, Rfl: 0  Review of Systems  Review of Systems  Constitutional: Negative for fever, chills, weight loss, malaise/fatigue and diaphoresis.  HENT: Negative for hearing loss, ear pain, nosebleeds, congestion, sore throat, neck pain, tinnitus and ear discharge.   Eyes: Negative for blurred vision, double vision, photophobia, pain, discharge and redness.  Respiratory: Negative for cough, hemoptysis, sputum production, shortness of breath, wheezing and stridor.   Cardiovascular: Negative for chest  pain, palpitations, orthopnea, claudication, leg swelling and PND.  Gastrointestinal: negative for abdominal pain. Negative for heartburn, nausea, vomiting, diarrhea, constipation, blood in stool and melena.  Genitourinary: Negative for dysuria, urgency, frequency, hematuria and flank pain.  Musculoskeletal: Negative for myalgias, back pain, joint pain and falls.  Skin: Negative for itching and rash.  Neurological:  Negative for dizziness, tingling, tremors, sensory change, speech change, focal weakness, seizures, loss of consciousness, weakness and headaches.  Endo/Heme/Allergies: Negative for environmental allergies and polydipsia. Does not bruise/bleed easily.  Psychiatric/Behavioral: Negative for depression, suicidal ideas, hallucinations, memory loss and substance abuse. The patient is not nervous/anxious and does not have insomnia.        Objective:  Blood pressure 119/69, pulse (!) 102, height 5\' 7"  (1.702 m), weight 180 lb (81.6 kg), last menstrual period 06/18/2022, not currently breastfeeding.   Physical Exam  Vitals reviewed. Constitutional: She is oriented to person, place, and time. She appears well-developed and well-nourished.  HENT:  Head: Normocephalic and atraumatic.        Right Ear: External ear normal.  Left Ear: External ear normal.  Nose: Nose normal.  Mouth/Throat: Oropharynx is clear and moist.  Eyes: Conjunctivae and EOM are normal. Pupils are equal, round, and reactive to light. Right eye exhibits no discharge. Left eye exhibits no discharge. No scleral icterus.  Neck: Normal range of motion. Neck supple. No tracheal deviation present. No thyromegaly present.  Cardiovascular: Normal rate, regular rhythm, normal heart sounds and intact distal pulses.  Exam reveals no gallop and no friction rub.   No murmur heard. Respiratory: Effort normal and breath sounds normal. No respiratory distress. She has no wheezes. She has no rales. She exhibits no tenderness.  GI: Soft.  Bowel sounds are normal. She exhibits no distension and no mass. There is no tenderness. There is no rebound and no guarding.  Genitourinary:  Breasts no masses skin changes or nipple changes bilaterally      Vulva is normal without lesions eruthematous consistent with yeast vulvitis Vagina is pink moist without discharge Cervix normal in appearance and pap is done Uterus is normal size shape and contour Adnexa is negative with normal sized ovaries   Musculoskeletal: Normal range of motion. She exhibits no edema and no tenderness.  Neurological: She is alert and oriented to person, place, and time. She has normal reflexes. She displays normal reflexes. No cranial nerve deficit. She exhibits normal muscle tone. Coordination normal.  Skin: Skin is warm and dry. No rash noted. No erythema. No pallor.  Psychiatric: She has a normal mood and affect. Her behavior is normal. Judgment and thought content normal.       Medications Ordered at today's visit: Meds ordered this encounter  Medications   fluconazole (DIFLUCAN) 100 MG tablet    Sig: Take 1 tablet (100 mg total) by mouth daily.    Dispense:  7 tablet    Refill:  0    Other orders placed at today's visit: No orders of the defined types were placed in this encounter.     Assessment:    Normal Gyn exam.   Yeast vulvitis Plan:    Contraception: condoms. Follow up in: 3 years.     Return in about 3 years (around 07/10/2025) for yearly.

## 2022-07-13 LAB — CYTOLOGY - PAP: Diagnosis: NEGATIVE

## 2022-08-27 IMAGING — DX DG FOOT COMPLETE 3+V*L*
3 series · 3 of 3 positions shown · non-contrast
Comparison: None.

CLINICAL DATA: Fall, rolled left foot and ankle

EXAM:
LEFT FOOT - COMPLETE 3+ VIEW; LEFT ANKLE COMPLETE - 3+ VIEW

[foot obl]
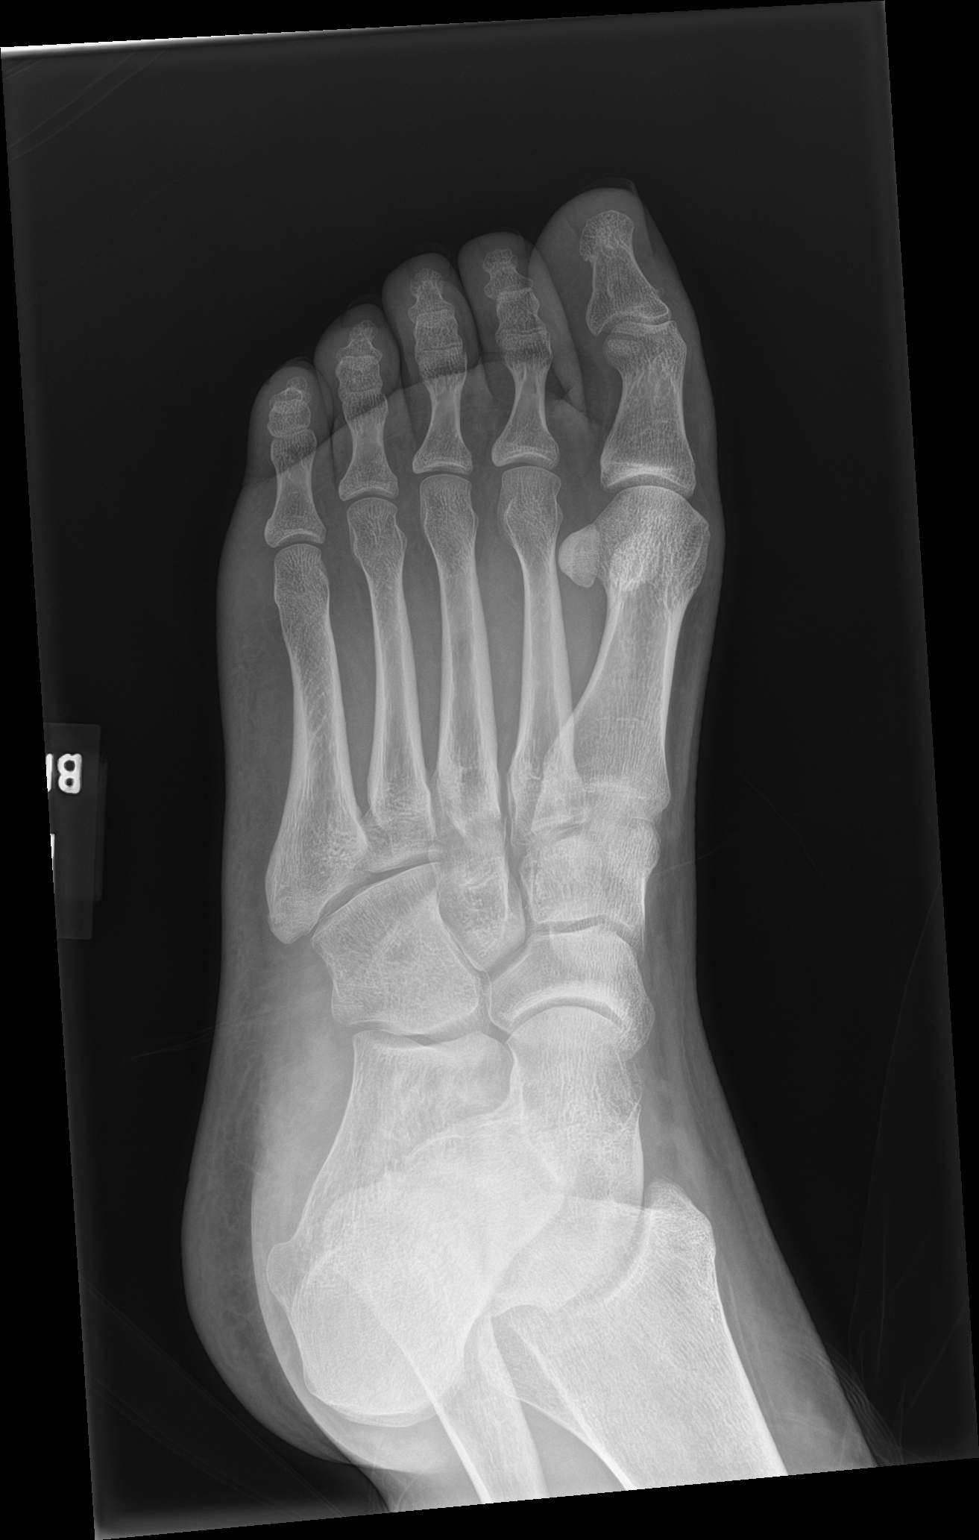

[foot ap]
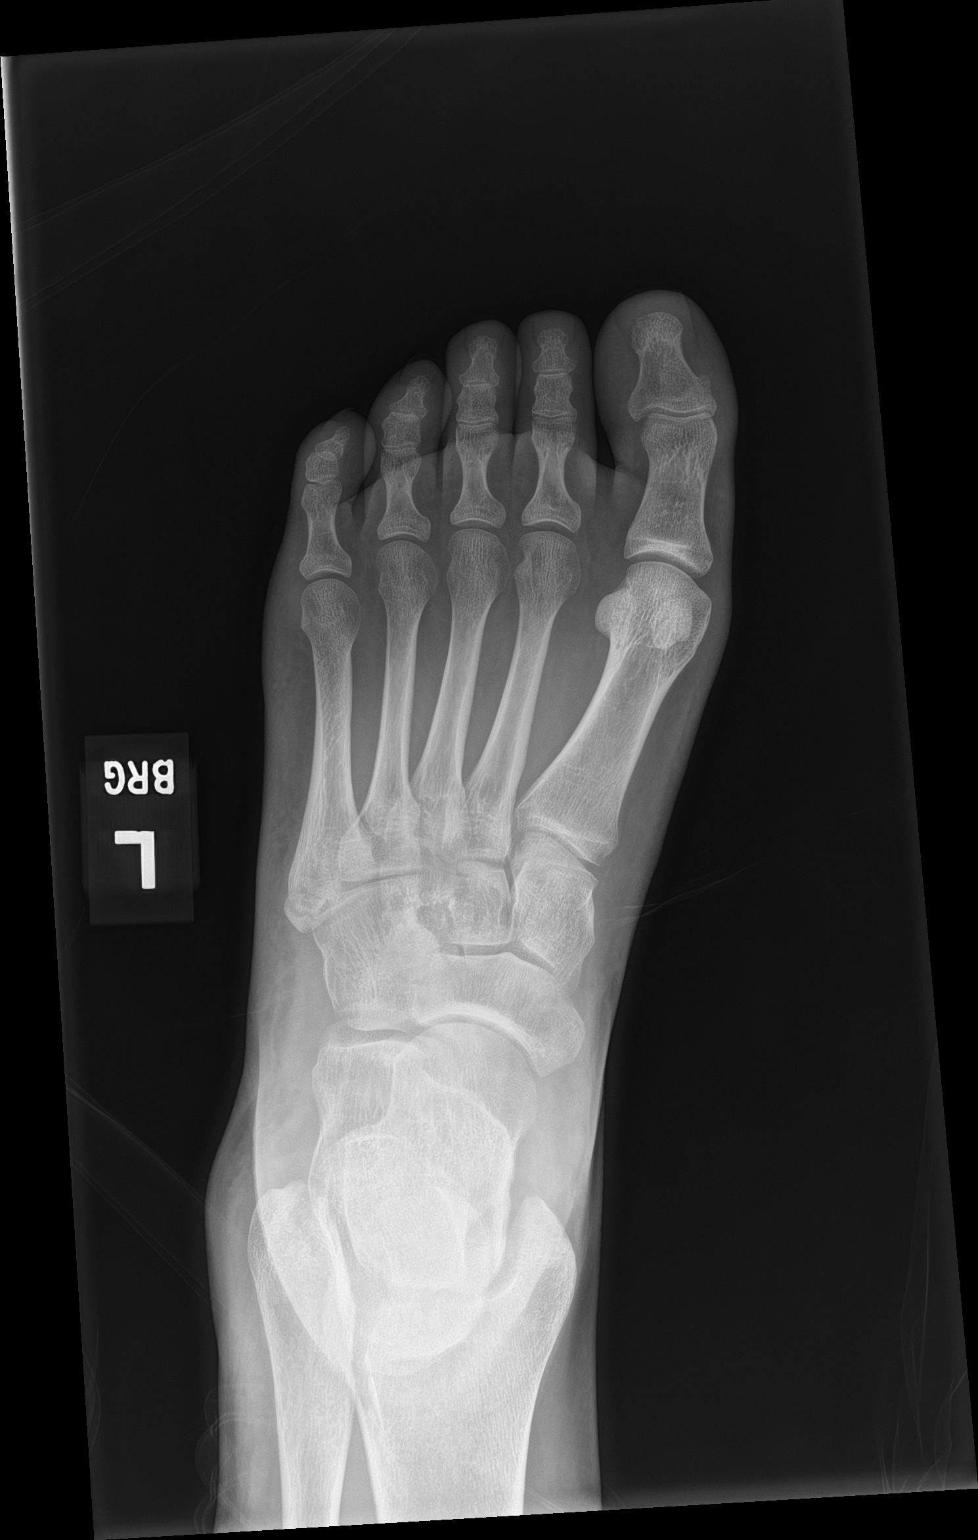

[foot lat]
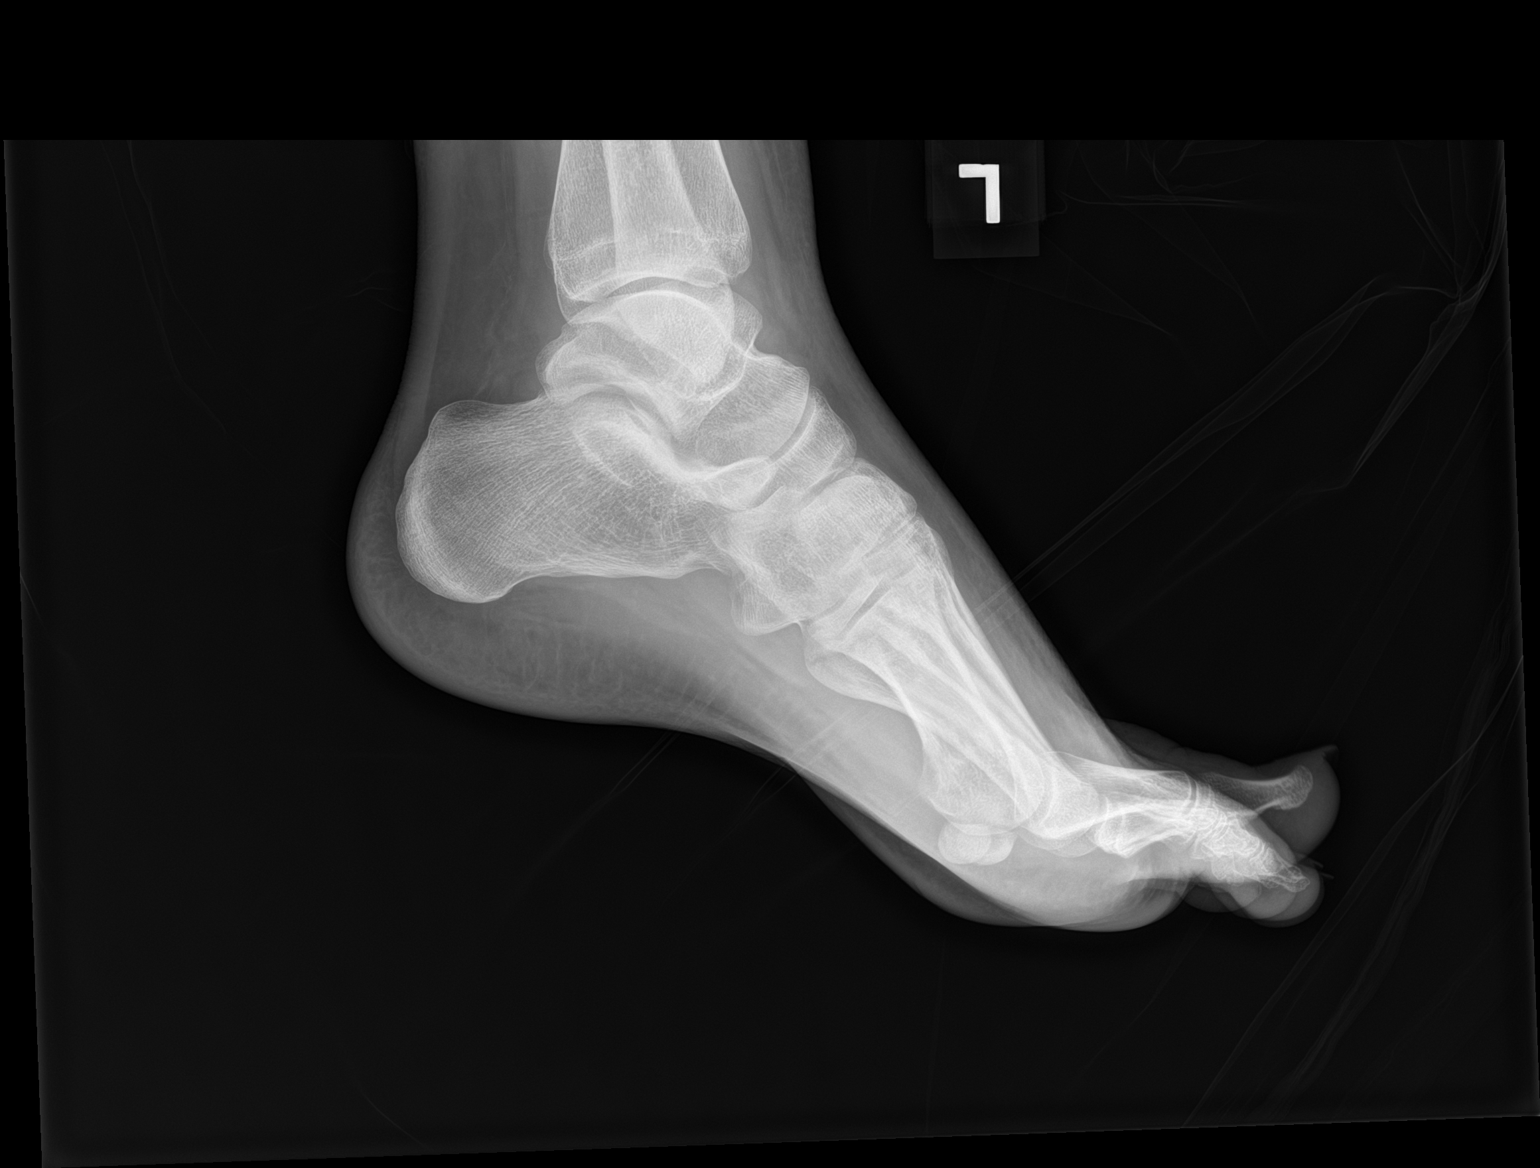

[3 of 3 positions shown; findings below may reference images not displayed]

FINDINGS: Ankle: There is no acute fracture or dislocation. Alignment is
normal. The ankle mortise is intact. There is soft tissue swelling
over the lateral malleolus.

Foot: There is a minimally displaced fracture of the base of the
fifth metatarsal with mild surrounding soft tissue swelling. There
is no other acute fracture. Alignment is normal. The Lisfranc and
Chopart joints are intact.
IMPRESSION: 1. Minimally displaced fracture of the base of the fifth metatarsal.
2. Soft tissue swelling over the lateral malleolus. No ankle
fracture identified.

## 2022-08-27 IMAGING — DX DG ANKLE COMPLETE 3+V*L*
3 series · 3 of 3 positions shown · non-contrast
Comparison: None.

CLINICAL DATA: Fall, rolled left foot and ankle

EXAM:
LEFT FOOT - COMPLETE 3+ VIEW; LEFT ANKLE COMPLETE - 3+ VIEW

[ankle ap]
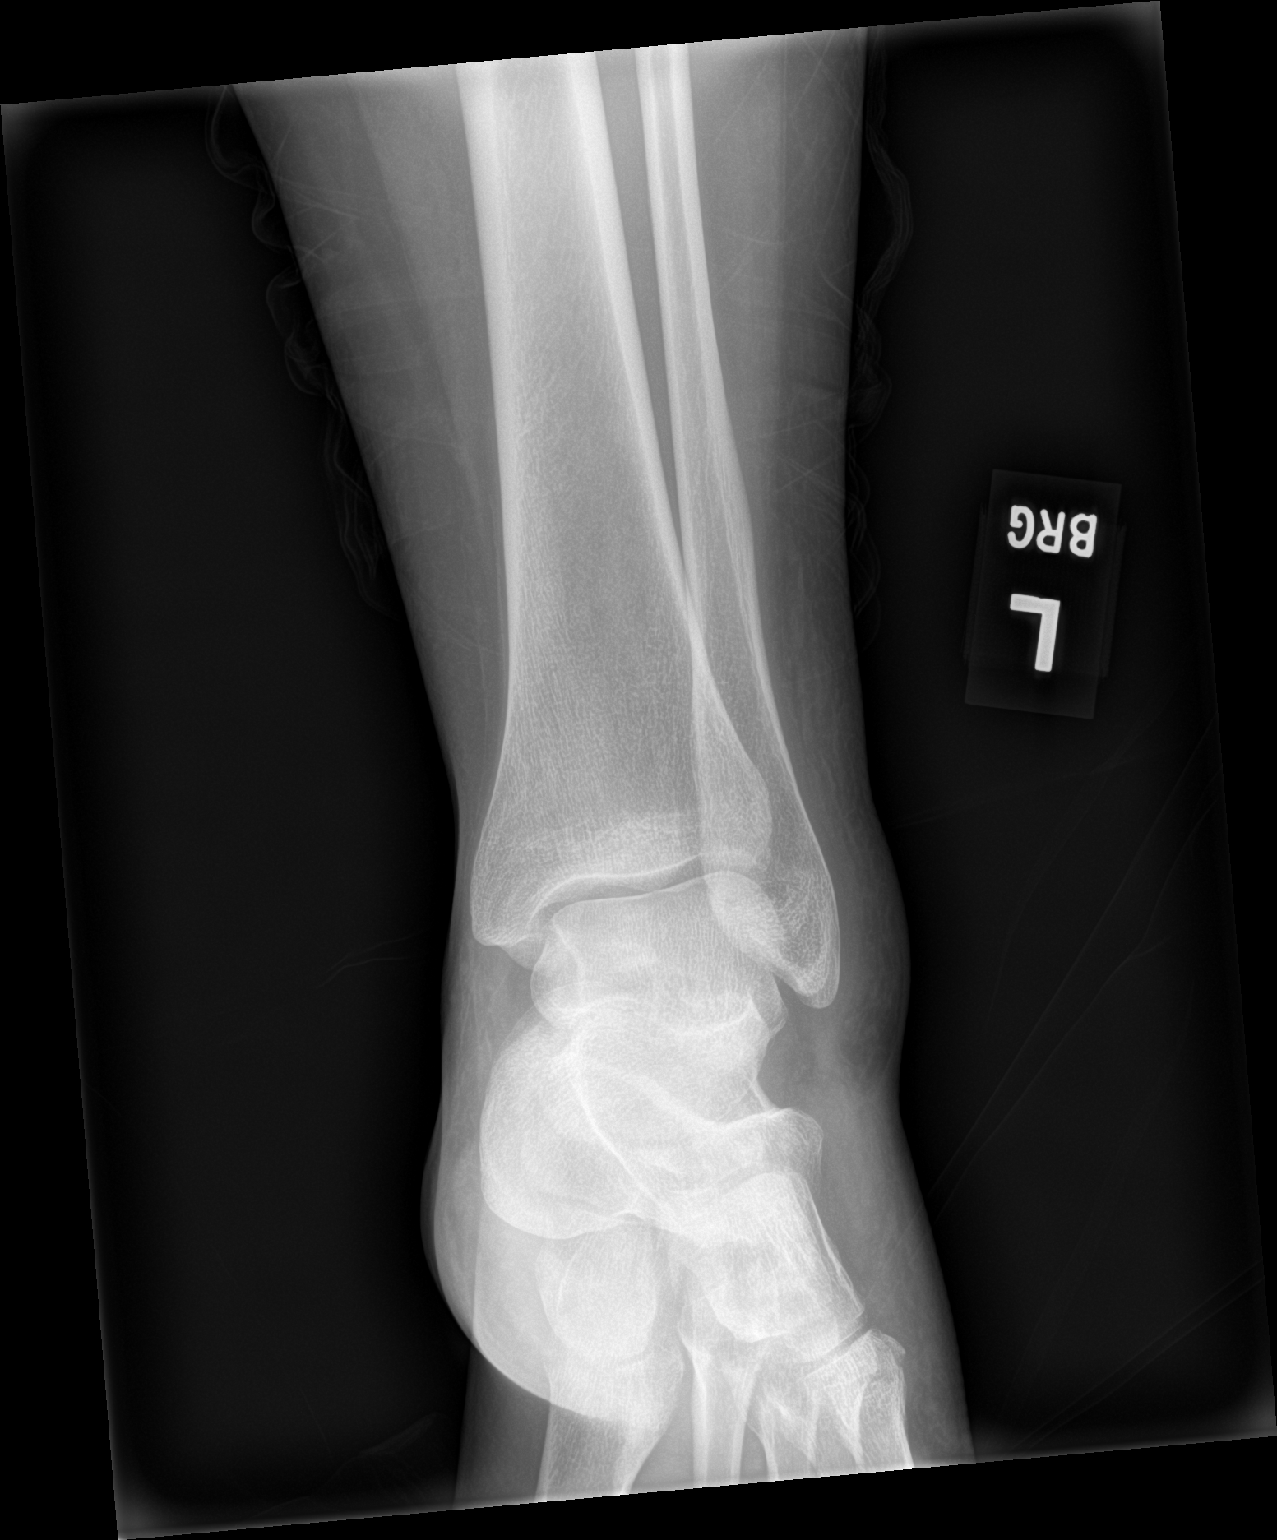

[ankle obl]
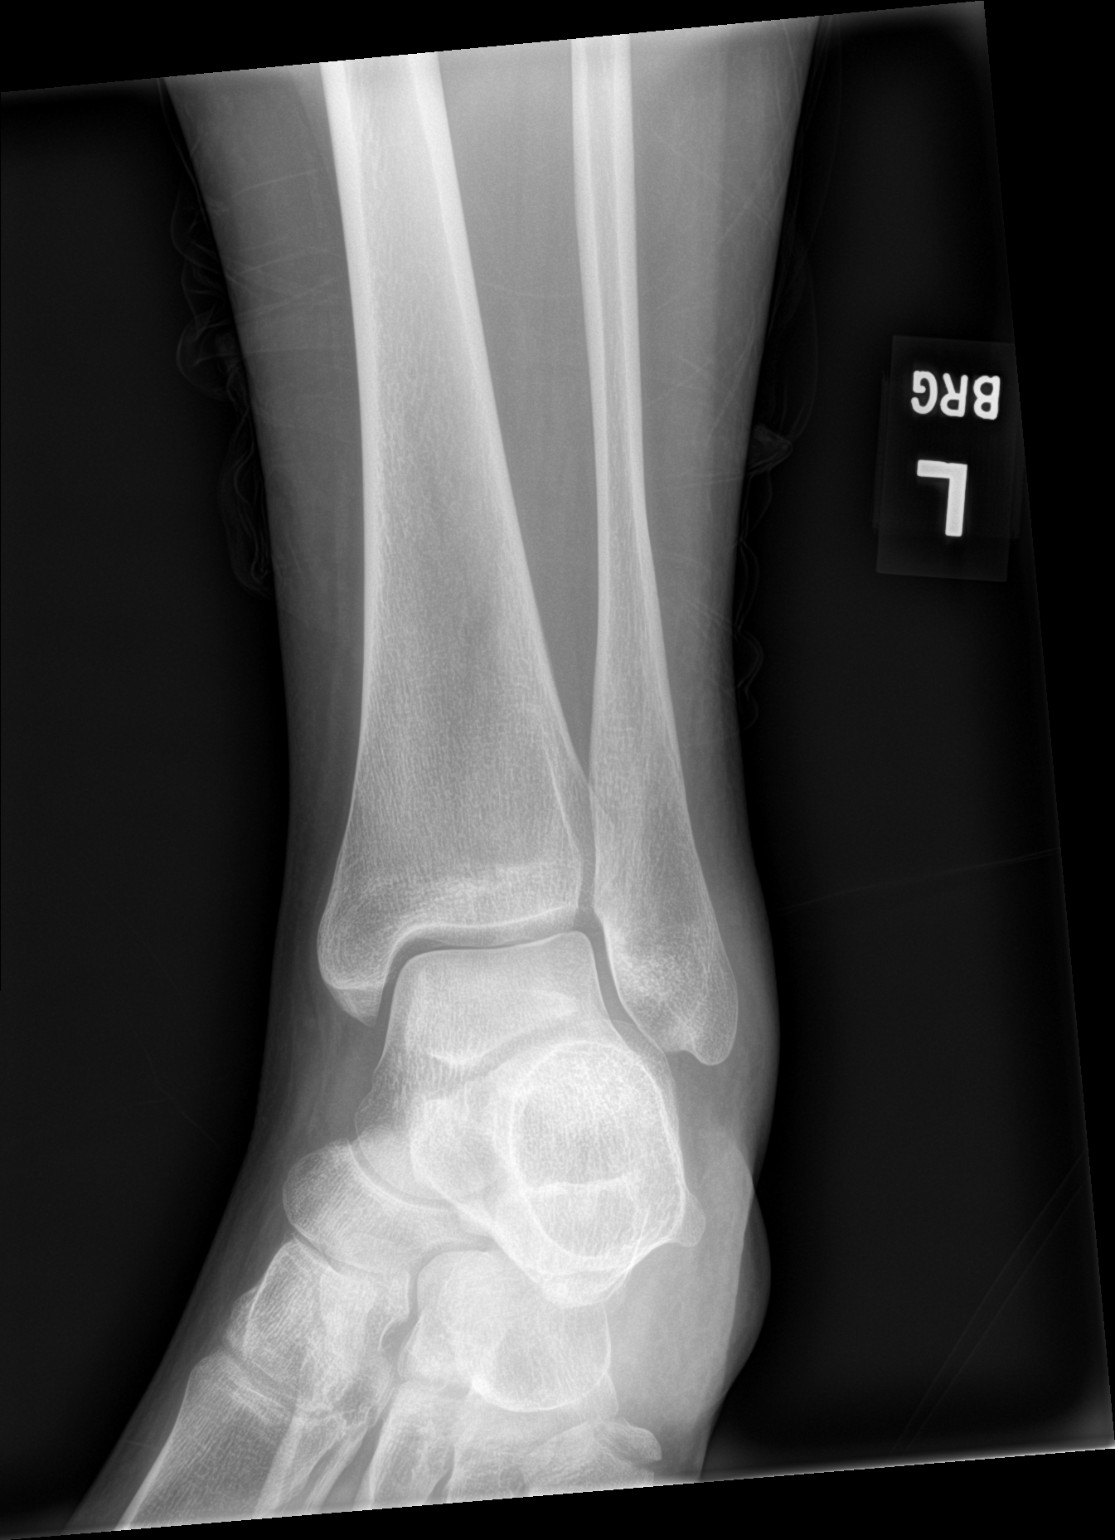

[ankle lat]
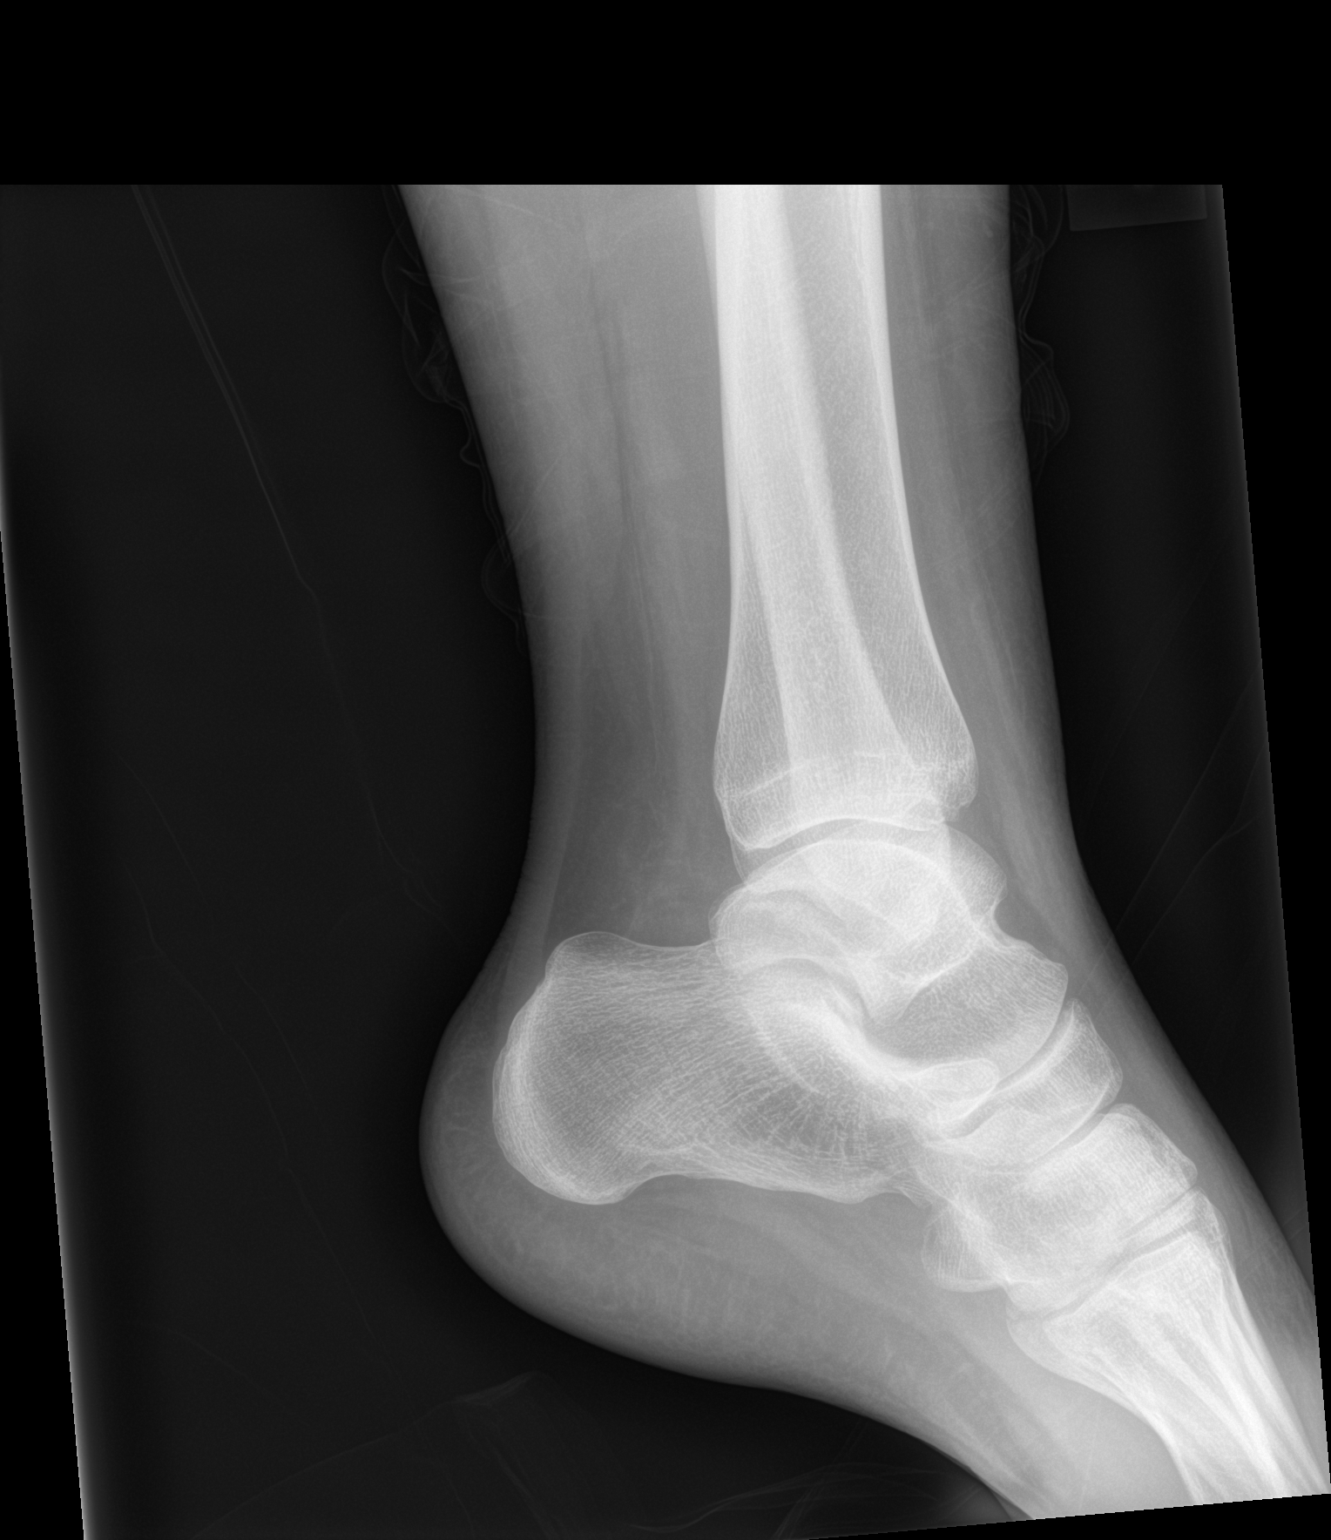

[3 of 3 positions shown; findings below may reference images not displayed]

FINDINGS: Ankle: There is no acute fracture or dislocation. Alignment is
normal. The ankle mortise is intact. There is soft tissue swelling
over the lateral malleolus.

Foot: There is a minimally displaced fracture of the base of the
fifth metatarsal with mild surrounding soft tissue swelling. There
is no other acute fracture. Alignment is normal. The Lisfranc and
Chopart joints are intact.
IMPRESSION: 1. Minimally displaced fracture of the base of the fifth metatarsal.
2. Soft tissue swelling over the lateral malleolus. No ankle
fracture identified.

## 2022-09-05 ENCOUNTER — Encounter: Payer: Self-pay | Admitting: Family Medicine

## 2022-09-19 ENCOUNTER — Encounter: Payer: Self-pay | Admitting: Family Medicine

## 2022-09-19 ENCOUNTER — Telehealth: Payer: Medicaid Other | Admitting: Physician Assistant

## 2022-09-19 DIAGNOSIS — K0889 Other specified disorders of teeth and supporting structures: Secondary | ICD-10-CM

## 2022-09-19 NOTE — Progress Notes (Signed)
Because we cannot prescribe stronger pain medications via e-visit, then you would need to contact your primary care provider, or would have to be evaluated at in-person urgent care or ER setting, if the dentist who did the procedures will not provide for you.   NOTE: There will be NO CHARGE for this eVisit   If you are having a true medical emergency please call 911.      For an urgent face to face visit, [facility] has eight urgent care centers for your convenience:   NEW!! [facility] [place] [facility] [place] Get Driving Directions [phone] [address] [place], [place] [facility] [place] [facility] [place] Get Driving Directions [phone] [place], [place] [address]   [facility]) Get Driving Directions [phone] [address], [place] [address]  [facility] [place] ([place]) Get Driving Directions [phone] [address] [place],  [place]  [address]  [facility] - at [address] [place] Get Driving Directions  [phone] [address] [place],  [place] [address]   [facility] Get Driving Directions [phone] [place], [place], [place] [address]   [facility] Get Driving Directions  [phone] [address] [place], [place] [address]   [facility] [place] Get Driving Directions [phone] [address], [place], [place] [address]  Your MyChart E-visit questionnaire answers were reviewed by a board certified advanced clinical practitioner to complete your personal care plan based on your specific symptoms.  Thank you for using e-Visits.

## 2022-09-20 ENCOUNTER — Encounter: Payer: Self-pay | Admitting: Family Medicine

## 2022-11-01 ENCOUNTER — Encounter: Payer: Self-pay | Admitting: Radiology

## 2023-04-09 ENCOUNTER — Telehealth: Payer: Self-pay | Admitting: Family Medicine

## 2023-08-21 ENCOUNTER — Other Ambulatory Visit: Payer: Self-pay | Admitting: Family Medicine

## 2023-08-21 DIAGNOSIS — E109 Type 1 diabetes mellitus without complications: Secondary | ICD-10-CM

## 2023-08-21 NOTE — Telephone Encounter (Signed)
Copied from CRM [id number]. Topic: Clinical - Medication Refill >> Aug 21, 2023 10:33 AM Antony Haste wrote: Most Recent Primary Care Visit:  Provider: Sonny Masters  Department: [facility]  Visit Type: OFFICE VISIT  Date: 02/28/2022  Medication: insulin regular (HUMULIN R) 100 units/mL injection TRUEPLUS INSULIN SYRINGE 31G X 5/16" 0.5 ML MISC   Has the patient contacted their pharmacy?  (Agent: If no, request that the patient contact the pharmacy for the refill. If patient does not wish to contact the pharmacy document the reason why and proceed with request.) (Agent: If yes, when and what did the pharmacy advise?)  Is this the correct pharmacy for this prescription?  If no, delete pharmacy and type the correct one.  This is the patient's preferred pharmacy:  [facility] [place], [place] - [address] [place] [address] Phone: [phone] Fax: [phone]  [id number] - [place], [place] - [address] [place] [address] Phone: [phone] Fax: [phone]  [facility] [address] [place] [address] Phone: [phone] Fax: [phone]   Has the prescription been filled recently?   Is the patient out of the medication?   Has the patient been seen for an appointment in the last year OR does the patient have an upcoming appointment?   Can we respond through MyChart?   Agent: Please be advised that Rx refills may take up to 3 business days. We ask that you follow-up with your pharmacy.

## 2023-09-12 ENCOUNTER — Ambulatory Visit: Payer: Medicaid Other | Admitting: Family Medicine

## 2023-09-12 ENCOUNTER — Encounter: Payer: Self-pay | Admitting: Family Medicine

## 2023-10-30 ENCOUNTER — Other Ambulatory Visit: Payer: Self-pay | Admitting: Family Medicine

## 2023-10-30 ENCOUNTER — Ambulatory Visit: Payer: Self-pay | Admitting: Family Medicine

## 2023-10-30 ENCOUNTER — Encounter: Payer: Self-pay | Admitting: Family Medicine

## 2023-10-30 DIAGNOSIS — E109 Type 1 diabetes mellitus without complications: Secondary | ICD-10-CM

## 2023-10-30 NOTE — Telephone Encounter (Signed)
NA letter mailed

## 2023-10-30 NOTE — Telephone Encounter (Signed)
 Copied from CRM [id number]. Topic: Clinical - Red Word Triage >> Oct 30, 2023  2:04 PM Franchot Heidelberg wrote: Red Word that prompted transfer to Nurse Triage: Possible broken right foot and right hand.  Chief Complaint: "I think I broke my right foot and hand".  Symptoms: swelling to hand and right foot Frequency: happened yesterday Pertinent Negatives: Patient denies deformity Disposition: [x] ED /[] Urgent Care (no appt availability in office) / [] Appointment(In office/virtual)/ []  [facility]/ [] Home Care/ [] Refused Recommended Disposition /[] [facility]/ []  Follow-up with PCP Additional Notes: Instructed to go to ER.  Patient was not giving specific information re: how injuries happened.  States she "may have dropped something on foot" and "may have hit hand against something".  Patient declined ER visit stating she doesn't have money to go to the ER or a way to get there.  Declined 911, states she has no one to watch her two children.  Cal line called and office updated.     Reason for Disposition  Serious injury with multiple fractures (broken bones)  Answer Assessment - Initial Assessment Questions 1. MECHANISM: "How did the injury happen?" (e.g., twisting injury, direct blow)      Right foot and right hand 2. ONSET: "When did the injury happen?" (Minutes or hours ago)      Thinks something was dropped on foot, and possible I could have hit something to right hand 3. LOCATION: "Where is the injury located?"      Right foot  and right hand 4. APPEARANCE of INJURY: "What does the injury look like?"      swelling 5. WEIGHT-BEARING: "Can you put weight on that foot?" "Can you walk (four steps or more)?"       no 6. SIZE: For cuts, bruises, or swelling, ask: "How large is it?" (e.g., inches or centimeters;  entire joint)      Moderate swelling 7. PAIN: "Is there pain?" If Yes, ask: "How bad is the pain?"    (e.g., Scale 1-10; or mild, moderate, severe)   - NONE (0): no  pain.   - MILD (1-3): doesn't interfere with normal activities.    - MODERATE (4-7): interferes with normal activities (e.g., work or school) or awakens from sleep, limping.    - SEVERE (8-10): excruciating pain, unable to do any normal activities, unable to walk.      10/10 foot; 8.5-9/10 8. TETANUS: For any breaks in the skin, ask: "When was the last tetanus booster?"     denies 9. OTHER SYMPTOMS: "Do you have any other symptoms?"      denies 10. PREGNANCY: "Is there any chance you are pregnant?" "When was your last menstrual period?"       NA  Protocols used: Ankle and Foot Injury-A-AH

## 2023-10-30 NOTE — Telephone Encounter (Signed)
 Marcelino Duster NTBS last OV 02/28/22 NO RF sent to pharmacy last OV greater than a year

## 2024-01-02 ENCOUNTER — Telehealth: Admitting: Family Medicine

## 2024-01-06 ENCOUNTER — Telehealth: Admitting: Physician Assistant

## 2024-01-06 DIAGNOSIS — B3731 Acute candidiasis of vulva and vagina: Secondary | ICD-10-CM

## 2024-01-06 DIAGNOSIS — K047 Periapical abscess without sinus: Secondary | ICD-10-CM

## 2024-01-06 DIAGNOSIS — N898 Other specified noninflammatory disorders of vagina: Secondary | ICD-10-CM

## 2024-01-06 MED ORDER — FLUCONAZOLE 150 MG PO TABS: 150.0000 mg | ORAL_TABLET | ORAL | 0 refills | Status: DC | PRN

## 2024-01-06 MED ORDER — AMOXICILLIN-POT CLAVULANATE 875-125 MG PO TABS: 1.0000 | ORAL_TABLET | Freq: Two times a day (BID) | ORAL | 0 refills | Status: DC

## 2024-01-06 NOTE — Progress Notes (Signed)
   Thank you for the details you included in the comment boxes. Those details are very helpful in determining the best course of treatment for you and help [place] to provide the best care. We recommend that you schedule a Virtual Urgent Care video visit in order for the provider to better assess what is going on.  The provider will be able to give you a more accurate diagnosis and treatment plan if we can more freely discuss your symptoms and with the addition of a virtual examination.   If you change your visit to a video visit, we will bill your insurance (similar to an office visit) and you will not be charged for this e-Visit. You will be able to stay at home and speak with the first available [facility] advanced practice provider. The link to do a video visit is in the drop down Menu tab of your Welcome screen in MyChart.       I have spent 5 minutes in review of e-visit questionnaire, review and updating patient chart, medical decision making and response to patient.   Margaretann Loveless, PA-C

## 2024-01-06 NOTE — Progress Notes (Signed)
 Virtual Visit Consent   Brittany Wade, you are scheduled for a virtual visit with a  provider today. Just as with appointments in the office, your consent must be obtained to participate. Your consent will be active for this visit and any virtual visit you may have with one of our providers in the next 365 days. If you have a MyChart account, a copy of this consent can be sent to you electronically.  As this is a virtual visit, video technology does not allow for your provider to perform a traditional examination. This may limit your provider's ability to fully assess your condition. If your provider identifies any concerns that need to be evaluated in person or the need to arrange testing (such as labs, EKG, etc.), we will make arrangements to do so. Although advances in technology are sophisticated, we cannot ensure that it will always work on either your end or our end. If the connection with a video visit is poor, the visit may have to be switched to a telephone visit. With either a video or telephone visit, we are not always able to ensure that we have a secure connection.  By engaging in this virtual visit, you consent to the provision of healthcare and authorize for your insurance to be billed (if applicable) for the services provided during this visit. Depending on your insurance coverage, you may receive a charge related to this service.  I need to obtain your verbal consent now. Are you willing to proceed with your visit today? Brittany Wade has provided verbal consent on 01/06/2024 for a virtual visit (video or telephone). Brittany Kelp, PA-C  Date: 01/06/2024 4:25 PM   Virtual Visit via Video Note   I, Brittany Wade, connected with  Brittany Wade  ([id number], [birth year]) on 01/06/24 at  4:15 PM EDT by a video-enabled telemedicine application and verified that I am speaking with the correct person using two identifiers.  Location: Patient: Virtual Visit Location  Patient: Home Provider: Virtual Visit Location Provider: Home Office   I discussed the limitations of evaluation and management by telemedicine and the availability of in person appointments. The patient expressed understanding and agreed to proceed.    History of Present Illness: Tuesday  Brittany Wade is a 23 y.o. who identifies as a female who was assigned female at birth, and is being seen today for dental pain and vaginal irritation.  HPI: Dental Pain  This is a recurrent problem. The current episode started more than 1 month ago. The problem occurs constantly. The problem has been gradually worsening. The pain is moderate. Associated symptoms include facial pain (under jaw). Pertinent negatives include no difficulty swallowing, fever, oral bleeding, sinus pressure or thermal sensitivity. She has tried acetaminophen  and NSAIDs for the symptoms. The treatment provided mild relief.  Vaginal Discharge The patient's primary symptoms include genital itching and vaginal discharge. This is a recurrent problem. The current episode started more than 1 month ago. The problem occurs constantly. The problem has been unchanged. The patient is experiencing no pain. Pertinent negatives include no fever. The vaginal discharge was white and thick. There has been no bleeding. She has not been passing clots. She has not been passing tissue. Nothing aggravates the symptoms. She has tried antifungals for the symptoms. The treatment provided no relief. No, her partner does not have an STD.   PMH: T1DM   Problems:  Patient Active Problem List   Diagnosis Date Noted   Type 1 diabetes mellitus  without complications (HCC) 02/28/2022   Positive GAD antibody 02/28/2022   History of primary cesarean section 08/07/2021   Type 2 diabetes mellitus without complication, with long-term current use of insulin  (HCC) 08/05/2021   Abnormal Pap smear of cervix 02/13/2021   Hyperlipidemia associated with type 2 diabetes mellitus (HCC)  10/04/2020   Hidradenitis suppurativa 09/30/2018   Gastroesophageal reflux disease without esophagitis 05/26/2018   Anxiety state 04/04/2015   Panic attack 04/04/2015    Allergies:  No Active Allergies  Medications:  Current Outpatient Medications:    amoxicillin -clavulanate (AUGMENTIN ) 875-125 MG tablet, Take 1 tablet by mouth 2 (two) times daily., Disp: 14 tablet, Rfl: 0   fluconazole  (DIFLUCAN ) 150 MG tablet, Take 1 tablet (150 mg total) by mouth every 3 (three) days as needed., Disp: 4 tablet, Rfl: 0   Accu-Chek Softclix Lancets lancets, Use as instructed to check blood sugar 4 times daily, Disp: 100 each, Rfl: 12   albuterol  (VENTOLIN  HFA) 108 (90 Base) MCG/ACT inhaler, USE 2 PUFFS EVERY 6 HOURS AS NEEDED, Disp: 8.5 g, Rfl: 1   furosemide  (LASIX ) 20 MG tablet, Take 1 tablet (20 mg total) by mouth 2 (two) times daily for 4 days., Disp: 8 tablet, Rfl: 0   glucose blood (ACCU-CHEK GUIDE) test strip, USE AS DIRECTED TO CHECK BLOOD SUGAR 4 TIMES DAILY, Disp: 50 each, Rfl: 7   insulin  regular (HUMULIN R ) 100 units/mL injection, INJECT 0.01ML (1 UNIT) INTO THE SKIN 2 TIMES DAILY BEFORE A MEAL <150(0UNITS); 150-199 (1U); 200-249(2U);250-299(3U); 300-349(4U); >350(5U), Disp: 10 mL, Rfl: 1   TRUEPLUS INSULIN  SYRINGE 31G X 5/16" 0.5 ML MISC, USE AS DIRECTED, Disp: 200 each, Rfl: 3  Observations/Objective: Patient is well-developed, well-nourished in no acute distress.  Resting comfortably at home.  Head is normocephalic, atraumatic.  No labored breathing.  Speech is clear and coherent with logical content.  Patient is alert and oriented at baseline.    Assessment and Plan: 1. Dental infection (Primary) - amoxicillin -clavulanate (AUGMENTIN ) 875-125 MG tablet; Take 1 tablet by mouth 2 (two) times daily.  Dispense: 14 tablet; Refill: 0  2. Yeast vaginitis - fluconazole  (DIFLUCAN ) 150 MG tablet; Take 1 tablet (150 mg total) by mouth every 3 (three) days as needed.  Dispense: 4 tablet; Refill:  0  - Suspected infection with broken tooth - Augmentin  prescribed - Can use ice on outside jaw/cheek for swelling - Can also take tylenol  for pain with other medications - Discussed DenTemp putty that can be used to cover a broken tooth - Diflucan  given for yeast infection symptoms - Schedule a follow with a dentist as soon as possible (Can contact [facility] if underinsured or uninsured) - Seek in person evaluation if symptoms fail to improve or if they worsen   Follow Up Instructions: I discussed the assessment and treatment plan with the patient. The patient was provided an opportunity to ask questions and all were answered. The patient agreed with the plan and demonstrated an understanding of the instructions.  A copy of instructions were sent to the patient via MyChart unless otherwise noted below.    The patient was advised to call back or seek an in-person evaluation if the symptoms worsen or if the condition fails to improve as anticipated.    Brittany Kelp, PA-C

## 2024-01-06 NOTE — Patient Instructions (Signed)
 Brittany  Hayes Wade, thank you for joining Angelia Kelp, PA-C for today's virtual visit.  While this provider is not your primary care provider (PCP), if your PCP is located in our provider database this encounter information will be shared with them immediately following your visit.   A [facility] MyChart account gives you access to today's visit and all your visits, tests, and labs performed at [facility] " click here if you don't have a [facility] MyChart account or go to [email or web address]  Consent: (Patient) Brittany Wade provided verbal consent for this virtual visit at the beginning of the encounter.  Current Medications:  Current Outpatient Medications:    amoxicillin -clavulanate (AUGMENTIN ) 875-125 MG tablet, Take 1 tablet by mouth 2 (two) times daily., Disp: 14 tablet, Rfl: 0   fluconazole  (DIFLUCAN ) 150 MG tablet, Take 1 tablet (150 mg total) by mouth every 3 (three) days as needed., Disp: 4 tablet, Rfl: 0   Accu-Chek Softclix Lancets lancets, Use as instructed to check blood sugar 4 times daily, Disp: 100 each, Rfl: 12   albuterol  (VENTOLIN  HFA) 108 (90 Base) MCG/ACT inhaler, USE 2 PUFFS EVERY 6 HOURS AS NEEDED, Disp: 8.5 g, Rfl: 1   furosemide  (LASIX ) 20 MG tablet, Take 1 tablet (20 mg total) by mouth 2 (two) times daily for 4 days., Disp: 8 tablet, Rfl: 0   glucose blood (ACCU-CHEK GUIDE) test strip, USE AS DIRECTED TO CHECK BLOOD SUGAR 4 TIMES DAILY, Disp: 50 each, Rfl: 7   insulin  regular (HUMULIN R ) 100 units/mL injection, INJECT 0.01ML (1 UNIT) INTO THE SKIN 2 TIMES DAILY BEFORE A MEAL <150(0UNITS); 150-199 (1U); 200-249(2U);250-299(3U); 300-349(4U); >350(5U), Disp: 10 mL, Rfl: 1   TRUEPLUS INSULIN  SYRINGE 31G X 5/16" 0.5 ML MISC, USE AS DIRECTED, Disp: 200 each, Rfl: 3   Medications ordered in this encounter:  Meds ordered this encounter  Medications   amoxicillin -clavulanate (AUGMENTIN ) 875-125 MG tablet    Sig: Take 1 tablet by mouth 2 (two)  times daily.    Dispense:  14 tablet    Refill:  0    Supervising Provider:   LAMPTEY, PHILIP O [[id number]]   fluconazole  (DIFLUCAN ) 150 MG tablet    Sig: Take 1 tablet (150 mg total) by mouth every 3 (three) days as needed.    Dispense:  4 tablet    Refill:  0    Supervising Provider:   LAMPTEY, PHILIP O [[id number]]     *If you need refills on other medications prior to your next appointment, please contact your pharmacy*  Follow-Up: Call back or seek an in-person evaluation if the symptoms worsen or if the condition fails to improve as anticipated.  [facility] [phone]  Other Instructions  Vaginal Yeast Infection, Adult  Vaginal yeast infection is a condition that causes vaginal discharge as well as soreness, swelling, and redness (inflammation) of the vagina. This is a common condition. Some women get this infection frequently. What are the causes? This condition is caused by a change in the normal balance of the yeast (Candida) and normal bacteria that live in the vagina. This change causes an overgrowth of yeast, which causes the inflammation. What increases the risk? The condition is more likely to develop in women who: Take antibiotic medicines. Have diabetes. Take birth control pills. Are pregnant. Douche often. Have a weak body defense system (immune system). Have been taking steroid medicines for a long time. Frequently wear tight clothing. What are the signs or symptoms? Symptoms of this  condition include: White, thick, creamy vaginal discharge. Swelling, itching, redness, and irritation of the vagina. The lips of the vagina (labia) may be affected as well. Pain or a burning feeling while urinating. Pain during sex. How is this diagnosed? This condition is diagnosed based on: Your medical history. A physical exam. A pelvic exam. Your health care provider will examine a sample of your vaginal discharge under a microscope. Your health care  provider may send this sample for testing to confirm the diagnosis. How is this treated? This condition is treated with medicine. Medicines may be over-the-counter or prescription. You may be told to use one or more of the following: Medicine that is taken by mouth (orally). Medicine that is applied as a cream (topically). Medicine that is inserted directly into the vagina (suppository). Follow these instructions at home: Take or apply over-the-counter and prescription medicines only as told by your health care provider. Do not use tampons until your health care provider approves. Do not have sex until your infection has cleared. Sex can prolong or worsen your symptoms of infection. Ask your health care provider when it is safe to resume sexual activity. Keep all follow-up visits. This is important. How is this prevented?  Do not wear tight clothes, such as pantyhose or tight pants. Wear breathable cotton underwear. Do not use douches, perfumed soap, creams, or powders. Wipe from front to back after using the toilet. If you have diabetes, keep your blood sugar levels under control. Ask your health care provider for other ways to prevent yeast infections. Contact a health care provider if: You have a fever. Your symptoms go away and then return. Your symptoms do not get better with treatment. Your symptoms get worse. You have new symptoms. You develop blisters in or around your vagina. You have blood coming from your vagina and it is not your menstrual period. You develop pain in your abdomen. Summary Vaginal yeast infection is a condition that causes discharge as well as soreness, swelling, and redness (inflammation) of the vagina. This condition is treated with medicine. Medicines may be over-the-counter or prescription. Take or apply over-the-counter and prescription medicines only as told by your health care provider. Do not douche. Resume sexual activity or use of tampons as  instructed by your health care provider. Contact a health care provider if your symptoms do not get better with treatment or your symptoms go away and then return. This information is not intended to replace advice given to you by your health care provider. Make sure you discuss any questions you have with your health care provider. Document Revised: 11/07/2020 Document Reviewed: 11/07/2020 Elsevier Patient Education  2024 Elsevier Inc.   Dental Abscess  A dental abscess is an infection around a tooth that may involve pain, swelling, and a collection of pus, as well as other symptoms. Treatment is important to help with symptoms and to prevent the infection from spreading. The general types of dental abscesses are: Pulpal abscess. This abscess may form from the inner part of the tooth (pulp). Periodontal abscess. This abscess may form from the gum. What are the causes? This condition is caused by a bacterial infection in or around the tooth. It may result from: Severe tooth decay (cavities). Trauma to the tooth, such as a broken or chipped tooth. What increases the risk? This condition is more likely to develop in males. It is also more likely to develop in people who: Have cavities. Have severe gum disease. Eat sugary snacks between meals.  Use tobacco products. Have diabetes. Have a weakened disease-fighting system (immune system). Do not brush and care for their teeth regularly. What are the signs or symptoms? Mild symptoms of this condition include: Tenderness. Bad breath. Fever. A bitter taste in the mouth. Pain in and around the infected tooth. Moderate symptoms of this condition include: Swollen neck glands. Chills. Pus drainage. Swelling and redness around the infected tooth, in the mouth, or in the face. Severe pain in and around the infected tooth. Severe symptoms of this condition include: Difficulty swallowing. Difficulty opening the  mouth. Nausea. Vomiting. How is this diagnosed? This condition is diagnosed based on: Your symptoms and your medical and dental history. An examination of the infected tooth. During the exam, your dental care provider may tap on the infected tooth. You may also need to have X-rays taken of the affected area. How is this treated? This condition is treated by getting rid of the infection. This may be done with: Antibiotic medicines. These may be used in certain situations. Antibacterial mouth rinse. Incision and drainage. This procedure is done by making an incision in the abscess to drain out the pus. Removing pus is the first priority in treating an abscess. A root canal. This may be performed to save the tooth. Your dental care provider accesses the visible part of your tooth (crown) with a drill and removes any infected pulp. Then the space is filled and sealed off. Tooth extraction. The tooth is pulled out if it cannot be saved by other treatment. You may also receive treatment for pain, such as: Acetaminophen  or NSAIDs. Gels that contain a numbing medicine. An injection to block the pain near your nerve. Follow these instructions at home: Medicines Take over-the-counter and prescription medicines only as told by your dental care provider. If you were prescribed an antibiotic, take it as told by your dental care provider. Do not stop taking the antibiotic even if you start to feel better. If you were prescribed a gel that contains a numbing medicine, use it exactly as told in the directions. Do not use these gels for children who are younger than 20 years of age. Use an antibacterial mouth rinse as told by your dental care provider. General instructions  Gargle with a mixture of salt and water  3-4 times a day or as needed. To make salt water , completely dissolve -1 tsp (3-6 g) of salt in 1 cup (237 mL) of warm water . Eat a soft diet while your abscess is healing. Drink enough fluid to  keep your urine pale yellow. Do not apply heat to the outside of your mouth. Do not use any products that contain nicotine or tobacco. These products include cigarettes, chewing tobacco, and vaping devices, such as e-cigarettes. If you need help quitting, ask your dental care provider. Keep all follow-up visits. This is important. How is this prevented?  Excellent dental home care, which includes brushing your teeth every morning and night with fluoride toothpaste. Floss one time each day. Get regularly scheduled dental cleanings. Consider having a dental sealant applied on teeth that have deep grooves to prevent cavities. Drink fluoridated water  regularly. This includes most tap water . Check the label on bottled water  to see if it contains fluoride. Reduce or eliminate sugary drinks. Eat healthy meals and snacks. Wear a mouth guard or face shield to protect your teeth while playing sports. Contact a health care provider if: Your pain is worse and is not helped by medicine. You have swelling. You see  pus around the tooth. You have a fever or chills. Get help right away if: Your symptoms suddenly get worse. You have a very bad headache. You have problems breathing or swallowing. You have trouble opening your mouth. You have swelling in your neck or around your eye. These symptoms may represent a serious problem that is an emergency. Do not wait to see if the symptoms will go away. Get medical help right away. Call your local emergency services (911 in the [place] Do not drive yourself to the hospital. Summary A dental abscess is a collection of pus in or around a tooth that results from an infection. A dental abscess may result from severe tooth decay, trauma to the tooth, or severe gum disease around a tooth. Symptoms include severe pain, swelling, redness, and drainage of pus in and around the infected tooth. The first priority in treating a dental abscess is to drain out the pus.  Treatment may also involve removing damage inside the tooth (root canal) or extracting the tooth. This information is not intended to replace advice given to you by your health care provider. Make sure you discuss any questions you have with your health care provider. Document Revised: 10/27/2020 Document Reviewed: 10/27/2020 Elsevier Patient Education  2024 Else[employer] Inc.   If you have been instructed to have an in-person evaluation today at a local Urgent Care facility, please use the link below. It will take you to a list of all of our available [facility], including address, phone number and hours of operation. Please do not delay care.  [facility]  If you or a family member do not have a primary care provider, use the link below to schedule a visit and establish care. When you choose a [facility] primary care physician or advanced practice provider, you gain a long-term partner in health. Find a Primary Care Provider  Learn more about [facility] in-office and virtual care options: [facility] - Get Care Now

## 2024-01-20 ENCOUNTER — Telehealth: Admitting: Physician Assistant

## 2024-01-20 DIAGNOSIS — J208 Acute bronchitis due to other specified organisms: Secondary | ICD-10-CM | POA: Diagnosis not present

## 2024-01-20 DIAGNOSIS — B9689 Other specified bacterial agents as the cause of diseases classified elsewhere: Secondary | ICD-10-CM | POA: Diagnosis not present

## 2024-01-20 MED ORDER — FLUCONAZOLE 150 MG PO TABS: ORAL_TABLET | ORAL | 0 refills | Status: DC

## 2024-01-20 MED ORDER — AZITHROMYCIN 250 MG PO TABS: ORAL_TABLET | ORAL | 0 refills | Status: AC

## 2024-01-20 MED ORDER — QVAR REDIHALER 40 MCG/ACT IN AERB: 2.0000 | INHALATION_SPRAY | Freq: Two times a day (BID) | RESPIRATORY_TRACT | 0 refills | Status: DC

## 2024-01-20 MED ORDER — BENZONATATE 100 MG PO CAPS
100.0000 mg | ORAL_CAPSULE | Freq: Three times a day (TID) | ORAL | 0 refills | Status: DC | PRN
Start: 2024-01-20 — End: 2024-03-29

## 2024-01-20 NOTE — Addendum Note (Signed)
 Addended by: Angelia Kelp on: 01/20/2024 04:43 PM   Modules accepted: Orders

## 2024-01-20 NOTE — Patient Instructions (Signed)
 Brittany  Hayes Wade, thank you for joining Brittany Maillard, PA-C for today's virtual visit.  While this provider is not your primary care provider (PCP), if your PCP is located in our provider database this encounter information will be shared with them immediately following your visit.   A [facility] MyChart account gives you access to today's visit and all your visits, tests, and labs performed at [facility] " click here if you don't have a [facility] MyChart account or go to [email or web address]  Consent: (Patient) Brittany  L Wade provided verbal consent for this virtual visit at the beginning of the encounter.  Current Medications:  Current Outpatient Medications:    Accu-Chek Softclix Lancets lancets, Use as instructed to check blood sugar 4 times daily, Disp: 100 each, Rfl: 12   albuterol  (VENTOLIN  HFA) 108 (90 Base) MCG/ACT inhaler, USE 2 PUFFS EVERY 6 HOURS AS NEEDED, Disp: 8.5 g, Rfl: 1   amoxicillin -clavulanate (AUGMENTIN ) 875-125 MG tablet, Take 1 tablet by mouth 2 (two) times daily., Disp: 14 tablet, Rfl: 0   fluconazole  (DIFLUCAN ) 150 MG tablet, Take 1 tablet (150 mg total) by mouth every 3 (three) days as needed., Disp: 4 tablet, Rfl: 0   furosemide  (LASIX ) 20 MG tablet, Take 1 tablet (20 mg total) by mouth 2 (two) times daily for 4 days., Disp: 8 tablet, Rfl: 0   glucose blood (ACCU-CHEK GUIDE) test strip, USE AS DIRECTED TO CHECK BLOOD SUGAR 4 TIMES DAILY, Disp: 50 each, Rfl: 7   insulin  regular (HUMULIN R ) 100 units/mL injection, INJECT 0.01ML (1 UNIT) INTO THE SKIN 2 TIMES DAILY BEFORE A MEAL <150(0UNITS); 150-199 (1U); 200-249(2U);250-299(3U); 300-349(4U); >350(5U), Disp: 10 mL, Rfl: 1   TRUEPLUS INSULIN  SYRINGE 31G X 5/16" 0.5 ML MISC, USE AS DIRECTED, Disp: 200 each, Rfl: 3   Medications ordered in this encounter:  No orders of the defined types were placed in this encounter.    *If you need refills on other medications prior to your next appointment,  please contact your pharmacy*  Follow-Up: Call back or seek an in-person evaluation if the symptoms worsen or if the condition fails to improve as anticipated.  [facility] [phone]  Other Instructions Take antibiotic (Azithromycin ) as directed.  Increase fluids.  Get plenty of rest. Use Mucinex for congestion. Use the Qvar  and cough medication as directed. Take a daily probiotic (I recommend Align or Culturelle, but even Activia Yogurt may be beneficial).  A humidifier placed in the bedroom may offer some relief for a dry, scratchy throat of nasal irritation.  Read information below on acute bronchitis. Please follow-up in person for any non-resolving, new or worsening symptoms despite treatment.   Acute Bronchitis Bronchitis is when the airways that extend from the windpipe into the lungs get red, puffy, and painful (inflamed). Bronchitis often causes thick spit (mucus) to develop. This leads to a cough. A cough is the most common symptom of bronchitis. In acute bronchitis, the condition usually begins suddenly and goes away over time (usually in 2 weeks). Smoking, allergies, and asthma can make bronchitis worse. Repeated episodes of bronchitis may cause more lung problems.  HOME CARE Rest. Drink enough fluids to keep your pee (urine) clear or pale yellow (unless you need to limit fluids as told by your doctor). Only take over-the-counter or prescription medicines as told by your doctor. Avoid smoking and secondhand smoke. These can make bronchitis worse. If you are a smoker, think about using nicotine gum or skin patches. Quitting smoking  will help your lungs heal faster. Reduce the chance of getting bronchitis again by: Washing your hands often. Avoiding people with cold symptoms. Trying not to touch your hands to your mouth, nose, or eyes. Follow up with your doctor as told.  GET HELP IF: Your symptoms do not improve after 1 week of treatment. Symptoms  include: Cough. Fever. Coughing up thick spit. Body aches. Chest congestion. Chills. Shortness of breath. Sore throat.  GET HELP RIGHT AWAY IF:  You have an increased fever. You have chills. You have severe shortness of breath. You have bloody thick spit (sputum). You throw up (vomit) often. You lose too much body fluid (dehydration). You have a severe headache. You faint.  MAKE SURE YOU:  Understand these instructions. Will watch your condition. Will get help right away if you are not doing well or get worse. Document Released: 02/06/2008 Document Revised: 04/22/2013 Document Reviewed: 02/10/2013 [facility] Patient Information 2015 [place], [place]. This information is not intended to replace advice given to you by your health care provider. Make sure you discuss any questions you have with your health care provider.    If you have been instructed to have an in-person evaluation today at a local Urgent Care facility, please use the link below. It will take you to a list of all of our available [facility], including address, phone number and hours of operation. Please do not delay care.  [facility]  If you or a family member do not have a primary care provider, use the link below to schedule a visit and establish care. When you choose a [facility] primary care physician or advanced practice provider, you gain a long-term partner in health. Find a Primary Care Provider  Learn more about [facility]'s in-office and virtual care options: [facility] - Get Care Now

## 2024-01-20 NOTE — Progress Notes (Signed)
 Virtual Visit Consent   Brittany Wade, you are scheduled for a virtual visit with a [facility] provider today. Just as with appointments in the office, your consent must be obtained to participate. Your consent will be active for this visit and any virtual visit you may have with one of our providers in the next 365 days. If you have a MyChart account, a copy of this consent can be sent to you electronically.  As this is a virtual visit, video technology does not allow for your provider to perform a traditional examination. This may limit your provider's ability to fully assess your condition. If your provider identifies any concerns that need to be evaluated in person or the need to arrange testing (such as labs, EKG, etc.), we will make arrangements to do so. Although advances in technology are sophisticated, we cannot ensure that it will always work on either your end or our end. If the connection with a video visit is poor, the visit may have to be switched to a telephone visit. With either a video or telephone visit, we are not always able to ensure that we have a secure connection.  By engaging in this virtual visit, you consent to the provision of healthcare and authorize for your insurance to be billed (if applicable) for the services provided during this visit. Depending on your insurance coverage, you may receive a charge related to this service.  I need to obtain your verbal consent now. Are you willing to proceed with your visit today? Brittany Wade has provided verbal consent on 01/20/2024 for a virtual visit (video or telephone). Hyla Maillard, [place]  Date: 01/20/2024 11:24 AM   Virtual Visit via Video Note   I, Hyla Maillard, connected with  Brittany Wade  ([id number], [birth year]) on 01/20/24 at 11:15 AM EDT by a video-enabled telemedicine application and verified that I am speaking with the correct person using two identifiers.  Location: Patient: Virtual Visit  Location Patient: Home Provider: Virtual Visit Location Provider: Home Office   I discussed the limitations of evaluation and management by telemedicine and the availability of in person appointments. The patient expressed understanding and agreed to proceed.    History of Present Illness: Brittany Wade is a 23 y.o. who identifies as a female who was assigned female at birth, and is being seen today for URI symptoms including chest congestion, cough and sore throat, that have now acutely worsened over the past few days. Cough is affecting sleep and causing chest wall tenderness. Noting some mild wheezing only somewhat alleviated with her albuterol  inhaler. Notes mucous production has changed to thick, yellow-green. Unsure of fever. Denies recent travel or known sick contact.    HPI: HPI  Problems:  Patient Active Problem List   Diagnosis Date Noted   Type 1 diabetes mellitus without complications (HCC) 02/28/2022   Positive GAD antibody 02/28/2022   History of primary cesarean section 08/07/2021   Type 2 diabetes mellitus without complication, with long-term current use of insulin  (HCC) 08/05/2021   Abnormal Pap smear of cervix 02/13/2021   Hyperlipidemia associated with type 2 diabetes mellitus (HCC) 10/04/2020   Hidradenitis suppurativa 09/30/2018   Gastroesophageal reflux disease without esophagitis 05/26/2018   Anxiety state 04/04/2015   Panic attack 04/04/2015    Allergies: No Active Allergies Medications:  Current Outpatient Medications:    azithromycin  (ZITHROMAX ) 250 MG tablet, Take 2 tablets on day 1, then 1 tablet daily on days 2 through 5, Disp:  6 tablet, Rfl: 0   beclomethasone (QVAR  REDIHALER) 40 MCG/ACT inhaler, Inhale 2 puffs into the lungs 2 (two) times daily., Disp: 1 each, Rfl: 0   benzonatate  (TESSALON ) 100 MG capsule, Take 1 capsule (100 mg total) by mouth 3 (three) times daily as needed for cough., Disp: 30 capsule, Rfl: 0   fluconazole  (DIFLUCAN ) 150 MG tablet,  Take 1 tablet PO once. Repeat in 3 days if needed., Disp: 2 tablet, Rfl: 0   Accu-Chek Softclix Lancets lancets, Use as instructed to check blood sugar 4 times daily, Disp: 100 each, Rfl: 12   albuterol  (VENTOLIN  HFA) 108 (90 Base) MCG/ACT inhaler, USE 2 PUFFS EVERY 6 HOURS AS NEEDED, Disp: 8.5 g, Rfl: 1   amoxicillin -clavulanate (AUGMENTIN ) 875-125 MG tablet, Take 1 tablet by mouth 2 (two) times daily., Disp: 14 tablet, Rfl: 0   furosemide  (LASIX ) 20 MG tablet, Take 1 tablet (20 mg total) by mouth 2 (two) times daily for 4 days., Disp: 8 tablet, Rfl: 0   glucose blood (ACCU-CHEK GUIDE) test strip, USE AS DIRECTED TO CHECK BLOOD SUGAR 4 TIMES DAILY, Disp: 50 each, Rfl: 7   insulin  regular (HUMULIN R ) 100 units/mL injection, INJECT 0.01ML (1 UNIT) INTO THE SKIN 2 TIMES DAILY BEFORE A MEAL <150(0UNITS); 150-199 (1U); 200-249(2U);250-299(3U); 300-349(4U); >350(5U), Disp: 10 mL, Rfl: 1   TRUEPLUS INSULIN  SYRINGE 31G X 5/16" 0.5 ML MISC, USE AS DIRECTED, Disp: 200 each, Rfl: 3  Observations/Objective: Patient is well-developed, well-nourished in no acute distress.  Resting comfortably  at home.  Head is normocephalic, atraumatic.  No labored breathing.  Speech is clear and coherent with logical content.  Patient is alert and oriented at baseline.   Assessment and Plan: 1. Acute bacterial bronchitis (Primary) - benzonatate  (TESSALON ) 100 MG capsule; Take 1 capsule (100 mg total) by mouth 3 (three) times daily as needed for cough.  Dispense: 30 capsule; Refill: 0 - azithromycin  (ZITHROMAX ) 250 MG tablet; Take 2 tablets on day 1, then 1 tablet daily on days 2 through 5  Dispense: 6 tablet; Refill: 0 - beclomethasone (QVAR  REDIHALER) 40 MCG/ACT inhaler; Inhale 2 puffs into the lungs 2 (two) times daily.  Dispense: 1 each; Refill: 0  Rx Azithromycin .  Increase fluids.  Rest.  Saline nasal spray.  Probiotic.  Mucinex as directed.  Humidifier in bedroom. Tessalon  per orders. Will add on Qvar  as prednisone  not good option due to uncontrolled diabetes (overdue for A1C).  Call or return to clinic if symptoms are not improving.   Follow Up Instructions: I discussed the assessment and treatment plan with the patient. The patient was provided an opportunity to ask questions and all were answered. The patient agreed with the plan and demonstrated an understanding of the instructions.  A copy of instructions were sent to the patient via MyChart unless otherwise noted below.   The patient was advised to call back or seek an in-person evaluation if the symptoms worsen or if the condition fails to improve as anticipated.    Hyla Maillard, PA-C

## 2024-01-21 ENCOUNTER — Ambulatory Visit: Payer: Self-pay

## 2024-01-21 NOTE — Telephone Encounter (Signed)
 Copied from CRM [id number]. Topic: Clinical - Medication Question >> Jan 21, 2024  4:01 PM Stanly Early wrote: Reason for CRM: patient would like to prescribe eye drops for pink eye.  Attempted to contact patient: called and no answer: left voicemail requesting pt to call back. Will route this chart to PCP office.

## 2024-01-22 NOTE — Telephone Encounter (Signed)
 Left message for patient to call back to schedule virtual visit with DOD.

## 2024-01-29 ENCOUNTER — Telehealth

## 2024-02-07 ENCOUNTER — Telehealth: Admitting: Family Medicine

## 2024-02-07 DIAGNOSIS — K047 Periapical abscess without sinus: Secondary | ICD-10-CM

## 2024-02-07 MED ORDER — PENICILLIN V POTASSIUM 500 MG PO TABS: 500.0000 mg | ORAL_TABLET | Freq: Three times a day (TID) | ORAL | 0 refills | Status: AC

## 2024-02-07 NOTE — Progress Notes (Signed)
 Virtual Visit Consent   Brittany Wade, you are scheduled for a virtual visit with a [facility] provider today. Just as with appointments in the office, your consent must be obtained to participate. Your consent will be active for this visit and any virtual visit you may have with one of our providers in the next 365 days. If you have a MyChart account, a copy of this consent can be sent to you electronically.  As this is a virtual visit, video technology does not allow for your provider to perform a traditional examination. This may limit your provider's ability to fully assess your condition. If your provider identifies any concerns that need to be evaluated in person or the need to arrange testing (such as labs, EKG, etc.), we will make arrangements to do so. Although advances in technology are sophisticated, we cannot ensure that it will always work on either your end or our end. If the connection with a video visit is poor, the visit may have to be switched to a telephone visit. With either a video or telephone visit, we are not always able to ensure that we have a secure connection.  By engaging in this virtual visit, you consent to the provision of healthcare and authorize for your insurance to be billed (if applicable) for the services provided during this visit. Depending on your insurance coverage, you may receive a charge related to this service.  I need to obtain your verbal consent now. Are you willing to proceed with your visit today? Brittany Wade has provided verbal consent on 02/07/2024 for a virtual visit (video or telephone). Brittany Huger, FNP  Date: 02/07/2024 12:33 PM   Virtual Visit via Video Note   I, Brittany Wade, connected with  Brittany Wade  ([id number], 27-Jul-2001) on 02/07/24 at 12:30 PM EDT by a video-enabled telemedicine application and verified that I am speaking with the correct person using two identifiers.  Location: Patient: Virtual Visit Location Patient:  Home Provider: Virtual Visit Location Provider: Home Office   I discussed the limitations of evaluation and management by telemedicine and the availability of in person appointments. The patient expressed understanding and agreed to proceed.    History of Present Illness: Brittany Wade is a 23 y.o. who identifies as a female who was assigned female at birth, and is being seen today for dental infection and pain lower gums and teeth with poor dentition. No fever. Aaron Aas  HPI: HPI  Problems:  Patient Active Problem List   Diagnosis Date Noted   Type 1 diabetes mellitus without complications (HCC) 02/28/2022   Positive GAD antibody 02/28/2022   History of primary cesarean section 08/07/2021   Type 2 diabetes mellitus without complication, with long-term current use of insulin  (HCC) 08/05/2021   Abnormal Pap smear of cervix 02/13/2021   Hyperlipidemia associated with type 2 diabetes mellitus (HCC) 10/04/2020   Hidradenitis suppurativa 09/30/2018   Gastroesophageal reflux disease without esophagitis 05/26/2018   Anxiety state 04/04/2015   Panic attack 04/04/2015    Allergies: No Active Allergies Medications:  Current Outpatient Medications:    Accu-Chek Softclix Lancets lancets, Use as instructed to check blood sugar 4 times daily, Disp: 100 each, Rfl: 12   albuterol  (VENTOLIN  HFA) 108 (90 Base) MCG/ACT inhaler, USE 2 PUFFS EVERY 6 HOURS AS NEEDED, Disp: 8.5 g, Rfl: 1   amoxicillin -clavulanate (AUGMENTIN ) 875-125 MG tablet, Take 1 tablet by mouth 2 (two) times daily., Disp: 14 tablet, Rfl: 0   beclomethasone (QVAR  REDIHALER)  40 MCG/ACT inhaler, Inhale 2 puffs into the lungs 2 (two) times daily., Disp: 10.6 g, Rfl: 0   benzonatate  (TESSALON ) 100 MG capsule, Take 1 capsule (100 mg total) by mouth 3 (three) times daily as needed for cough., Disp: 30 capsule, Rfl: 0   fluconazole  (DIFLUCAN ) 150 MG tablet, Take 1 tablet PO once. Repeat in 3 days if needed., Disp: 2 tablet, Rfl: 0   furosemide   (LASIX ) 20 MG tablet, Take 1 tablet (20 mg total) by mouth 2 (two) times daily for 4 days., Disp: 8 tablet, Rfl: 0   glucose blood (ACCU-CHEK GUIDE) test strip, USE AS DIRECTED TO CHECK BLOOD SUGAR 4 TIMES DAILY, Disp: 50 each, Rfl: 7   insulin  regular (HUMULIN R ) 100 units/mL injection, INJECT 0.01ML (1 UNIT) INTO THE SKIN 2 TIMES DAILY BEFORE A MEAL <150(0UNITS); 150-199 (1U); 200-249(2U);250-299(3U); 300-349(4U); >350(5U), Disp: 10 mL, Rfl: 1   TRUEPLUS INSULIN  SYRINGE 31G X 5/16" 0.5 ML MISC, USE AS DIRECTED, Disp: 200 each, Rfl: 3  Observations/Objective: Patient is well-developed, well-nourished in no acute distress.  Resting comfortably  at home.  Head is normocephalic, atraumatic.  No labored breathing.  Speech is clear and coherent with logical content.  Patient is alert and oriented at baseline.    Assessment and Plan: There are no diagnoses linked to this encounter. Warm salt water  gargles, make apptmt with dentist next week.  Follow Up Instructions: I discussed the assessment and treatment plan with the patient. The patient was provided an opportunity to ask questions and all were answered. The patient agreed with the plan and demonstrated an understanding of the instructions.  A copy of instructions were sent to the patient via MyChart unless otherwise noted below.     The patient was advised to call back or seek an in-person evaluation if the symptoms worsen or if the condition fails to improve as anticipated.    Demarqus Jocson, FNP

## 2024-02-07 NOTE — Patient Instructions (Signed)
 Dental Pain: What It Means  Dental pain is often a sign that something is wrong with your teeth or gums. You can also have pain after a dental treatment. If you have dental pain, it's important to contact your dental care provider, especially if you don't know what's causing the pain.  Dental pain may hurt a lot or a little. It can be caused by many things, including: Tooth decay. This is also called cavities or caries. Infection. An abscess. This is when the inner part of the tooth is filled with pus. An injury or a crack in the tooth. Gum disease or gums that move back and expose the root of a tooth. Abnormal grinding or clenching of teeth. Not taking good care of your teeth. Sometimes the cause of pain isn't known. You may have pain all the time, or it may come and go. It may happen only when you're: Chewing. Exposed to hot or cold temperatures. Eating or drinking foods or drinks with a lot of sugar in them, such as soda or candy. Follow these instructions at home: Medicines Take your medicines only as told. If you were given antibiotics, take them as told. Do not stop taking them even if you start to feel better. Eating and drinking Avoid foods or drinks that cause you pain. These may include: Very hot or very cold foods or drinks. Sweet or sugary foods or drinks. Managing pain and swelling  Use ice or an ice pack on the painful area of your face as told. Put ice in a plastic bag. Place a towel between your skin and the ice. Leave the ice on for 20 minutes, 2-3 times a day. Remove the ice if your skin turns bright red. This is very important. If you cannot feel pain, heat, or cold, you have a greater risk of damage to the area. If your skin turns red, take off the ice right away to prevent skin damage. The risk of damage is higher if you can't feel pain, heat, or cold. Brushing and flossing Brush your teeth twice a day using a fluoride toothpaste. Use a toothpaste made for  sensitive teeth as told by your dental care provider. Always use a soft toothbrush. This will help prevent irritation of your gums. Floss your teeth at least once a day. General instructions Do not apply heat to the outside of your face. To cleanse your mouth and help with any irritation and swelling in the painful area, you can try rinsing with salt water. Swish and gargle with salt water and then spit it out. To make salt water, add a half to a whole spoonful of salt to a glass of warm water. Mix well. You can repeat this every 2-3 hours for pain. Contact a dental care provider if: You have dental pain and you don't know why. Your pain isn't controlled with medicines. Your symptoms get worse. You have new symptoms. Get help right away if: You can't open your mouth. You're having trouble breathing or swallowing. You have a fever. Your face, neck, or jaw is swollen. These symptoms may be an emergency. Call 911 right away. Do not wait to see if the symptoms will go away. Do not drive yourself to the hospital. This information is not intended to replace advice given to you by your health care provider. Make sure you discuss any questions you have with your health care provider. Document Revised: 07/02/2023 Document Reviewed: 01/17/2023 Elsevier Patient Education  2024 [employer].

## 2024-02-25 ENCOUNTER — Other Ambulatory Visit: Payer: Self-pay | Admitting: Family Medicine

## 2024-02-25 DIAGNOSIS — E109 Type 1 diabetes mellitus without complications: Secondary | ICD-10-CM

## 2024-02-29 ENCOUNTER — Telehealth: Admitting: Family Medicine

## 2024-02-29 DIAGNOSIS — N760 Acute vaginitis: Secondary | ICD-10-CM

## 2024-02-29 MED ORDER — FLUCONAZOLE 150 MG PO TABS: ORAL_TABLET | ORAL | 0 refills | Status: DC

## 2024-02-29 NOTE — Patient Instructions (Signed)
 Brittany  LITTIE Wade, thank you for joining Roosvelt Mater, PA-C for today's virtual visit.  While this provider is not your primary care provider (PCP), if your PCP is located in our provider database this encounter information will be shared with them immediately following your visit.   A [facility] MyChart account gives you access to today's visit and all your visits, tests, and labs performed at [facility]  click here if you don't have a [facility] MyChart account or go to [email or web address]  Consent: (Patient) Brittany  L Wade provided verbal consent for this virtual visit at the beginning of the encounter.  Current Medications:  Current Outpatient Medications:    fluconazole  (DIFLUCAN ) 150 MG tablet, Take one tablet today, if symptoms persist after 72 hours, take 2nd dose., Disp: 2 tablet, Rfl: 0   Accu-Chek Softclix Lancets lancets, Use as instructed to check blood sugar 4 times daily, Disp: 100 each, Rfl: 12   albuterol  (VENTOLIN  HFA) 108 (90 Base) MCG/ACT inhaler, USE 2 PUFFS EVERY 6 HOURS AS NEEDED, Disp: 8.5 g, Rfl: 1   amoxicillin -clavulanate (AUGMENTIN ) 875-125 MG tablet, Take 1 tablet by mouth 2 (two) times daily., Disp: 14 tablet, Rfl: 0   beclomethasone (QVAR  REDIHALER) 40 MCG/ACT inhaler, Inhale 2 puffs into the lungs 2 (two) times daily., Disp: 10.6 g, Rfl: 0   benzonatate  (TESSALON ) 100 MG capsule, Take 1 capsule (100 mg total) by mouth 3 (three) times daily as needed for cough., Disp: 30 capsule, Rfl: 0   furosemide  (LASIX ) 20 MG tablet, Take 1 tablet (20 mg total) by mouth 2 (two) times daily for 4 days., Disp: 8 tablet, Rfl: 0   glucose blood (ACCU-CHEK GUIDE) test strip, USE AS DIRECTED TO CHECK BLOOD SUGAR 4 TIMES DAILY, Disp: 50 each, Rfl: 7   insulin  regular (HUMULIN R ) 100 units/mL injection, INJECT 0.01ML (1 UNIT) INTO THE SKIN 2 TIMES DAILY BEFORE A MEAL <150(0UNITS); 150-199 (1U); 200-249(2U);250-299(3U); 300-349(4U); >350(5U), Disp: 10 mL, Rfl: 0    TRUEPLUS INSULIN  SYRINGE 31G X 5/16 0.5 ML MISC, USE AS DIRECTED, Disp: 200 each, Rfl: 3   Medications ordered in this encounter:  Meds ordered this encounter  Medications   fluconazole  (DIFLUCAN ) 150 MG tablet    Sig: Take one tablet today, if symptoms persist after 72 hours, take 2nd dose.    Dispense:  2 tablet    Refill:  0    Please notify Pt not to take if pregnant or concern for pregnancy.     *If you need refills on other medications prior to your next appointment, please contact your pharmacy*  Follow-Up: Call back or seek an in-person evaluation if the symptoms worsen or if the condition fails to improve as anticipated.  [facility] [phone]  Other Instructions Vaginal Yeast Infection, Adult  Vaginal yeast infection is a condition that causes vaginal discharge as well as soreness, swelling, and redness (inflammation) of the vagina. This is a common condition. Some women get this infection frequently. What are the causes? This condition is caused by a change in the normal balance of the yeast (Candida) and normal bacteria that live in the vagina. This change causes an overgrowth of yeast, which causes the inflammation. What increases the risk? The condition is more likely to develop in women who: Take antibiotic medicines. Have diabetes. Take birth control pills. Are pregnant. Douche often. Have a weak body defense system (immune system). Have been taking steroid medicines for a long time. Frequently wear tight clothing. What are  the signs or symptoms? Symptoms of this condition include: White, thick, creamy vaginal discharge. Swelling, itching, redness, and irritation of the vagina. The lips of the vagina (labia) may be affected as well. Pain or a burning feeling while urinating. Pain during sex. How is this diagnosed? This condition is diagnosed based on: Your medical history. A physical exam. A pelvic exam. Your health care provider will  examine a sample of your vaginal discharge under a microscope. Your health care provider may send this sample for testing to confirm the diagnosis. How is this treated? This condition is treated with medicine. Medicines may be over-the-counter or prescription. You may be told to use one or more of the following: Medicine that is taken by mouth (orally). Medicine that is applied as a cream (topically). Medicine that is inserted directly into the vagina (suppository). Follow these instructions at home: Take or apply over-the-counter and prescription medicines only as told by your health care provider. Do not use tampons until your health care provider approves. Do not have sex until your infection has cleared. Sex can prolong or worsen your symptoms of infection. Ask your health care provider when it is safe to resume sexual activity. Keep all follow-up visits. This is important. How is this prevented?  Do not wear tight clothes, such as pantyhose or tight pants. Wear breathable cotton underwear. Do not use douches, perfumed soap, creams, or powders. Wipe from front to back after using the toilet. If you have diabetes, keep your blood sugar levels under control. Ask your health care provider for other ways to prevent yeast infections. Contact a health care provider if: You have a fever. Your symptoms go away and then return. Your symptoms do not get better with treatment. Your symptoms get worse. You have new symptoms. You develop blisters in or around your vagina. You have blood coming from your vagina and it is not your menstrual period. You develop pain in your abdomen. Summary Vaginal yeast infection is a condition that causes discharge as well as soreness, swelling, and redness (inflammation) of the vagina. This condition is treated with medicine. Medicines may be over-the-counter or prescription. Take or apply over-the-counter and prescription medicines only as told by your health  care provider. Do not douche. Resume sexual activity or use of tampons as instructed by your health care provider. Contact a health care provider if your symptoms do not get better with treatment or your symptoms go away and then return. This information is not intended to replace advice given to you by your health care provider. Make sure you discuss any questions you have with your health care provider. Document Revised: 11/07/2020 Document Reviewed: 11/07/2020 Elsevier Patient Education  2024 Elsevier Inc.   If you have been instructed to have an in-person evaluation today at a local Urgent Care facility, please use the link below. It will take you to a list of all of our available [facility], including address, phone number and hours of operation. Please do not delay care.  [facility]  If you or a family member do not have a primary care provider, use the link below to schedule a visit and establish care. When you choose a [facility] primary care physician or advanced practice provider, you gain a long-term partner in health. Find a Primary Care Provider  Learn more about [facility]'s in-office and virtual care options: [facility] - Get Care Now

## 2024-02-29 NOTE — Progress Notes (Signed)
 Virtual Visit Consent   Brittany Wade, you are scheduled for a virtual visit with a [facility] provider today. Just as with appointments in the office, your consent must be obtained to participate. Your consent will be active for this visit and any virtual visit you may have with one of our providers in the next 365 days. If you have a MyChart account, a copy of this consent can be sent to you electronically.  As this is a virtual visit, video technology does not allow for your provider to perform a traditional examination. This may limit your provider's ability to fully assess your condition. If your provider identifies any concerns that need to be evaluated in person or the need to arrange testing (such as labs, EKG, etc.), we will make arrangements to do so. Although advances in technology are sophisticated, we cannot ensure that it will always work on either your end or our end. If the connection with a video visit is poor, the visit may have to be switched to a telephone visit. With either a video or telephone visit, we are not always able to ensure that we have a secure connection.  By engaging in this virtual visit, you consent to the provision of healthcare and authorize for your insurance to be billed (if applicable) for the services provided during this visit. Depending on your insurance coverage, you may receive a charge related to this service.  I need to obtain your verbal consent now. Are you willing to proceed with your visit today? Brittany Wade has provided verbal consent on 02/29/2024 for a virtual visit (video or telephone). Roosvelt Mater, [place]  Date: 02/29/2024 11:55 AM   Virtual Visit via Video Note   I, Roosvelt Mater, connected with  Brittany Wade  ([id number], Apr 17, 2001) on 02/29/24 at 11:45 AM EDT by a video-enabled telemedicine application and verified that I am speaking with the correct person using two identifiers.  Location: Patient: Virtual Visit Location Patient:  Home Provider: Virtual Visit Location Provider: Home Office   I discussed the limitations of evaluation and management by telemedicine and the availability of in person appointments. The patient expressed understanding and agreed to proceed.    History of Present Illness: Brittany Wade is a 23 y.o. who identifies as a female who was assigned female at birth, and is being seen today for c/o yeast infection that started 2 weeks ago and is worsening.  Pt states she was on an antibiotics. Pt states she has vaginal discharge that is white and thick. Pt denies fever, chills, N/V.   HPI: HPI  Problems:  Patient Active Problem List   Diagnosis Date Noted   Type 1 diabetes mellitus without complications (HCC) 02/28/2022   Positive GAD antibody 02/28/2022   History of primary cesarean section 08/07/2021   Type 2 diabetes mellitus without complication, with long-term current use of insulin  (HCC) 08/05/2021   Abnormal Pap smear of cervix 02/13/2021   Hyperlipidemia associated with type 2 diabetes mellitus (HCC) 10/04/2020   Hidradenitis suppurativa 09/30/2018   Gastroesophageal reflux disease without esophagitis 05/26/2018   Anxiety state 04/04/2015   Panic attack 04/04/2015    Allergies: No Active Allergies Medications:  Current Outpatient Medications:    fluconazole  (DIFLUCAN ) 150 MG tablet, Take one tablet today, if symptoms persist after 72 hours, take 2nd dose., Disp: 2 tablet, Rfl: 0   Accu-Chek Softclix Lancets lancets, Use as instructed to check blood sugar 4 times daily, Disp: 100 each, Rfl: 12  albuterol  (VENTOLIN  HFA) 108 (90 Base) MCG/ACT inhaler, USE 2 PUFFS EVERY 6 HOURS AS NEEDED, Disp: 8.5 g, Rfl: 1   amoxicillin -clavulanate (AUGMENTIN ) 875-125 MG tablet, Take 1 tablet by mouth 2 (two) times daily., Disp: 14 tablet, Rfl: 0   beclomethasone (QVAR  REDIHALER) 40 MCG/ACT inhaler, Inhale 2 puffs into the lungs 2 (two) times daily., Disp: 10.6 g, Rfl: 0   benzonatate  (TESSALON ) 100 MG  capsule, Take 1 capsule (100 mg total) by mouth 3 (three) times daily as needed for cough., Disp: 30 capsule, Rfl: 0   furosemide  (LASIX ) 20 MG tablet, Take 1 tablet (20 mg total) by mouth 2 (two) times daily for 4 days., Disp: 8 tablet, Rfl: 0   glucose blood (ACCU-CHEK GUIDE) test strip, USE AS DIRECTED TO CHECK BLOOD SUGAR 4 TIMES DAILY, Disp: 50 each, Rfl: 7   insulin  regular (HUMULIN R ) 100 units/mL injection, INJECT 0.01ML (1 UNIT) INTO THE SKIN 2 TIMES DAILY BEFORE A MEAL <150(0UNITS); 150-199 (1U); 200-249(2U);250-299(3U); 300-349(4U); >350(5U), Disp: 10 mL, Rfl: 0   TRUEPLUS INSULIN  SYRINGE 31G X 5/16 0.5 ML MISC, USE AS DIRECTED, Disp: 200 each, Rfl: 3  Observations/Objective: Patient is well-developed, well-nourished in no acute distress.  Resting comfortably at home.  Head is normocephalic, atraumatic.  No labored breathing.  Speech is clear and coherent with logical content.  Patient is alert and oriented at baseline.    Assessment and Plan: 1. Vaginosis (Primary) - fluconazole  (DIFLUCAN ) 150 MG tablet; Take one tablet today, if symptoms persist after 72 hours, take 2nd dose.  Dispense: 2 tablet; Refill: 0  -Start Fluconazole  -Pt to F/U with PCP or urgent care for worsening symptoms  Follow Up Instructions: I discussed the assessment and treatment plan with the patient. The patient was provided an opportunity to ask questions and all were answered. The patient agreed with the plan and demonstrated an understanding of the instructions.  A copy of instructions were sent to the patient via MyChart unless otherwise noted below.    The patient was advised to call back or seek an in-person evaluation if the symptoms worsen or if the condition fails to improve as anticipated.    Roosvelt Mater, PA-C

## 2024-03-02 ENCOUNTER — Other Ambulatory Visit: Payer: Self-pay | Admitting: Family Medicine

## 2024-03-02 DIAGNOSIS — E109 Type 1 diabetes mellitus without complications: Secondary | ICD-10-CM

## 2024-03-12 ENCOUNTER — Encounter: Payer: Self-pay | Admitting: Family Medicine

## 2024-03-12 ENCOUNTER — Ambulatory Visit: Admitting: Family Medicine

## 2024-03-29 ENCOUNTER — Telehealth: Admitting: Physician Assistant

## 2024-03-29 DIAGNOSIS — K047 Periapical abscess without sinus: Secondary | ICD-10-CM | POA: Diagnosis not present

## 2024-03-29 DIAGNOSIS — B3731 Acute candidiasis of vulva and vagina: Secondary | ICD-10-CM | POA: Diagnosis not present

## 2024-03-29 MED ORDER — FLUCONAZOLE 150 MG PO TABS: ORAL_TABLET | ORAL | 1 refills | Status: DC

## 2024-03-29 MED ORDER — AMOXICILLIN-POT CLAVULANATE 875-125 MG PO TABS: 1.0000 | ORAL_TABLET | Freq: Two times a day (BID) | ORAL | 0 refills | Status: DC

## 2024-03-29 NOTE — Patient Instructions (Signed)
 Brittany  LITTIE Wade, thank you for joining Brittany Velma Lunger, PA-C for today's virtual visit.  While this provider is not your primary care provider (PCP), if your PCP is located in our provider database this encounter information will be shared with them immediately following your visit.   A [facility] MyChart account gives you access to today's visit and all your visits, tests, and labs performed at [facility]  click here if you don't have a [facility] MyChart account or go to [email or web address]  Consent: (Patient) Brittany  L Wade provided verbal consent for this virtual visit at the beginning of the encounter.  Current Medications:  Current Outpatient Medications:    amoxicillin -clavulanate (AUGMENTIN ) 875-125 MG tablet, Take 1 tablet by mouth 2 (two) times daily., Disp: 14 tablet, Rfl: 0   fluconazole  (DIFLUCAN ) 150 MG tablet, Take 1 tablet PO once. Repeat in 3 days if needed., Disp: 2 tablet, Rfl: 1   Accu-Chek Softclix Lancets lancets, Use as instructed to check blood sugar 4 times daily, Disp: 100 each, Rfl: 12   albuterol  (VENTOLIN  HFA) 108 (90 Base) MCG/ACT inhaler, USE 2 PUFFS EVERY 6 HOURS AS NEEDED, Disp: 8.5 g, Rfl: 1   furosemide  (LASIX ) 20 MG tablet, Take 1 tablet (20 mg total) by mouth 2 (two) times daily for 4 days., Disp: 8 tablet, Rfl: 0   glucose blood (ACCU-CHEK GUIDE) test strip, USE AS DIRECTED TO CHECK BLOOD SUGAR 4 TIMES DAILY, Disp: 50 each, Rfl: 7   HUMULIN R  100 UNIT/ML injection, INJECT 0.01ML (1 UNIT) INTO THE SKIN 2 TIMES DAILY BEFORE A MEAL <150(0UNITS); 150-199 (1U); 200-249(2U);250-299(3U); 300-349(4U); >350(5U), Disp: 10 mL, Rfl: 0   TRUEPLUS INSULIN  SYRINGE 31G X 5/16 0.5 ML MISC, USE AS DIRECTED, Disp: 200 each, Rfl: 3   Medications ordered in this encounter:  Meds ordered this encounter  Medications   amoxicillin -clavulanate (AUGMENTIN ) 875-125 MG tablet    Sig: Take 1 tablet by mouth 2 (two) times daily.    Dispense:  14 tablet     Refill:  0    Supervising Provider:   LAMPTEY, PHILIP O [[id number]]   fluconazole  (DIFLUCAN ) 150 MG tablet    Sig: Take 1 tablet PO once. Repeat in 3 days if needed.    Dispense:  2 tablet    Refill:  1    Supervising Provider:   LAMPTEY, PHILIP O [id number]     *If you need refills on other medications prior to your next appointment, please contact your pharmacy*  Follow-Up: Call back or seek an in-person evaluation if the symptoms worsen or if the condition fails to improve as anticipated.  [facility] [phone]  Other Instructions Please keep hydrated. Try to keep up with good oral hygiene. I recommend peroxyl mouth rinse over-the-counter twice daily. Take the antibiotic as directed. Take the yeast infection medication as directed.  As a reminder, I did place a refill of this medication on file, in case of any recurrent yeast after taking your course of antibiotics. If you note any non-resolving, new, or worsening symptoms despite treatment, please seek an in-person evaluation ASAP.    If you have been instructed to have an in-person evaluation today at a local Urgent Care facility, please use the link below. It will take you to a list of all of our available [facility], including address, phone number and hours of operation. Please do not delay care.   Urgent Cares  If you or a family member do  not have a primary care provider, use the link below to schedule a visit and establish care. When you choose a [facility] primary care physician or advanced practice provider, you gain a long-term partner in health. Find a Primary Care Provider  Learn more about [facility]'s in-office and virtual care options: [facility] - Get Care Now

## 2024-03-29 NOTE — Progress Notes (Signed)
 Virtual Visit Consent   Brittany  L Wade, you are scheduled for a virtual visit with a [facility] provider today. Just as with appointments in the office, your consent must be obtained to participate. Your consent will be active for this visit and any virtual visit you may have with one of our providers in the next 365 days. If you have a MyChart account, a copy of this consent can be sent to you electronically.  As this is a virtual visit, video technology does not allow for your provider to perform a traditional examination. This may limit your provider's ability to fully assess your condition. If your provider identifies any concerns that need to be evaluated in person or the need to arrange testing (such as labs, EKG, etc.), we will make arrangements to do so. Although advances in technology are sophisticated, we cannot ensure that it will always work on either your end or our end. If the connection with a video visit is poor, the visit may have to be switched to a telephone visit. With either a video or telephone visit, we are not always able to ensure that we have a secure connection.  By engaging in this virtual visit, you consent to the provision of healthcare and authorize for your insurance to be billed (if applicable) for the services provided during this visit. Depending on your insurance coverage, you may receive a charge related to this service.  I need to obtain your verbal consent now. Are you willing to proceed with your visit today? Brittany Wade has provided verbal consent on 03/29/2024 for a virtual visit (video or telephone). Brittany Wade, [place]  Date: 03/29/2024 10:44 AM   Virtual Visit via Video Note   I, Brittany Wade, connected with  Brittany Wade  ([id number], 2000-12-20) on 03/29/24 at 10:30 AM EDT by a video-enabled telemedicine application and verified that I am speaking with the correct person using two identifiers.  Location: Patient: Virtual Visit  Location Patient: Home Provider: Virtual Visit Location Provider: Home Office   I discussed the limitations of evaluation and management by telemedicine and the availability of in person appointments. The patient expressed understanding and agreed to proceed.    History of Present Illness: Brittany Wade is a 23 y.o. who identifies as a female who was assigned female at birth, and is being seen today for multiple concerns.  Patient endorses concern of possible vaginal yeast infection.  Notes over the past several days having Stanc[place] with itching associated with thick white vaginal discharge.  Denies fever or chills.  Denies urinary symptoms.  Denies concern for pregnancy or STI.  Patient also has concern of a recurrence of dental infection.  Notes history of very poor dentition, been trying to get dentures approved by her insurance so she can get extractions, etc.  Patient notes over the past 10 days or so have an increase in left-sided lower dental pain with swelling.  Denies fever or chills.   HPI: HPI  Problems:  Patient Active Problem List   Diagnosis Date Noted   Type 1 diabetes mellitus without complications (HCC) 02/28/2022   Positive GAD antibody 02/28/2022   History of primary cesarean section 08/07/2021   Type 2 diabetes mellitus without complication, with long-term current use of insulin  (HCC) 08/05/2021   Abnormal Pap smear of cervix 02/13/2021   Hyperlipidemia associated with type 2 diabetes mellitus (HCC) 10/04/2020   Hidradenitis suppurativa 09/30/2018   Gastroesophageal reflux disease without esophagitis 05/26/2018  Anxiety state 04/04/2015   Panic attack 04/04/2015    Allergies: No Known Allergies Medications:  Current Outpatient Medications:    amoxicillin -clavulanate (AUGMENTIN ) 875-125 MG tablet, Take 1 tablet by mouth 2 (two) times daily., Disp: 14 tablet, Rfl: 0   fluconazole  (DIFLUCAN ) 150 MG tablet, Take 1 tablet PO once. Repeat in 3 days if needed., Disp: 2  tablet, Rfl: 1   Accu-Chek Softclix Lancets lancets, Use as instructed to check blood sugar 4 times daily, Disp: 100 each, Rfl: 12   albuterol  (VENTOLIN  HFA) 108 (90 Base) MCG/ACT inhaler, USE 2 PUFFS EVERY 6 HOURS AS NEEDED, Disp: 8.5 g, Rfl: 1   furosemide  (LASIX ) 20 MG tablet, Take 1 tablet (20 mg total) by mouth 2 (two) times daily for 4 days., Disp: 8 tablet, Rfl: 0   glucose blood (ACCU-CHEK GUIDE) test strip, USE AS DIRECTED TO CHECK BLOOD SUGAR 4 TIMES DAILY, Disp: 50 each, Rfl: 7   HUMULIN R  100 UNIT/ML injection, INJECT 0.01ML (1 UNIT) INTO THE SKIN 2 TIMES DAILY BEFORE A MEAL <150(0UNITS); 150-199 (1U); 200-249(2U);250-299(3U); 300-349(4U); >350(5U), Disp: 10 mL, Rfl: 0   TRUEPLUS INSULIN  SYRINGE 31G X 5/16 0.5 ML MISC, USE AS DIRECTED, Disp: 200 each, Rfl: 3  Observations/Objective: Patient is well-developed, well-nourished in no acute distress.  Resting comfortably  at home.  Head is normocephalic, atraumatic.  No labored breathing.  Speech is clear and coherent with logical content.  Patient is alert and oriented at baseline.  Substantial tooth decay noted on visual examination.  Assessment and Plan: 1. Yeast vaginitis (Primary) - fluconazole  (DIFLUCAN ) 150 MG tablet; Take 1 tablet PO once. Repeat in 3 days if needed.  Dispense: 2 tablet; Refill: 1  Will start Diflucan  150 as 2 dose series, taking 1 tablet initially, followed by the second tablet 72 hours later.  Did put an additional refill on file in case of antibiotic induced yeast from needing to treat her dental infection (see below)  2. Dental infection - amoxicillin -clavulanate (AUGMENTIN ) 875-125 MG tablet; Take 1 tablet by mouth 2 (two) times daily.  Dispense: 14 tablet; Refill: 0  Supportive measures and OTC medications reviewed.  Augmentin  per orders.  Since she is having difficulty following up with her dental provider, awaiting some insurance coverage for procedure, she needs to follow-up with her primary care  provider for any recurring issues until that dental evaluation.  Follow Up Instructions: I discussed the assessment and treatment plan with the patient. The patient was provided an opportunity to ask questions and all were answered. The patient agreed with the plan and demonstrated an understanding of the instructions.  A copy of instructions were sent to the patient via MyChart unless otherwise noted below.   The patient was advised to call back or seek an in-person evaluation if the symptoms worsen or if the condition fails to improve as anticipated.    Brittany Velma Lunger, PA-C

## 2024-04-14 ENCOUNTER — Ambulatory Visit: Payer: Self-pay

## 2024-04-14 NOTE — Telephone Encounter (Signed)
 FYI Only or Action Required?: Action required by provider: request for appointment.  Patient was last seen in primary care on 02/07/2024 by Blair, Diane W, FNP.  Called Nurse Triage reporting Chest Pain.  Symptoms began several days ago.  Interventions attempted: Nothing.  Symptoms are: unchanged. Rib pain, no injury. Hurts with movement and deep breaths, appointment per Montie in the practice.  Triage Disposition: See Physician Within 24 Hours  Patient/caregiver understands and will follow disposition?: Yes   Copied from CRM [id number]. Topic: Clinical - Red Word Triage >> Apr 14, 2024 10:56 AM Brittany Wade wrote: Red Word that prompted transfer to Nurse Triage: The patient called in stating she has been having pain on the left side in her rib cage area for several days. She states is hurts to move, when she coughs or sneezes or takes a deep breath. She says the pain level is about an 8. I will transfer her to E2C2 NT Reason for Disposition  [1] Chest pain lasts < 5 minutes AND [2] NO chest pain or cardiac symptoms (e.g., breathing difficulty, sweating) now  (Exception: Chest pains that last only a few seconds.)  Answer Assessment - Initial Assessment Questions 1. LOCATION: Where does it hurt?       Left ribs 2. RADIATION: Does the pain go anywhere else? (e.g., into neck, jaw, arms, back)     no 3. ONSET: When did the chest pain begin? (Minutes, hours or days)      Last Friday 4. PATTERN: Does the pain come and go, or has it been constant since it started?  Does it get worse with exertion?      Comes and goes 5. DURATION: How long does it last (e.g., seconds, minutes, hours)     minutes 6. SEVERITY: How bad is the pain?  (e.g., Scale 1-10; mild, moderate, or severe)     8 7. CARDIAC RISK FACTORS: Do you have any history of heart problems or risk factors for heart disease? (e.g., angina, prior heart attack; diabetes, high blood pressure, high cholesterol, smoker, or strong  family history of heart disease)     no 8. PULMONARY RISK FACTORS: Do you have any history of lung disease?  (e.g., blood clots in lung, asthma, emphysema, birth control pills)     no 9. CAUSE: What do you think is causing the chest pain?     unsure 10. OTHER SYMPTOMS: Do you have any other symptoms? (e.g., dizziness, nausea, vomiting, sweating, fever, difficulty breathing, cough)       no 11. PREGNANCY: Is there any chance you are pregnant? When was your last menstrual period?       no  Protocols used: Chest Pain-A-AH

## 2024-04-15 ENCOUNTER — Encounter: Payer: Self-pay | Admitting: Nurse Practitioner

## 2024-04-15 ENCOUNTER — Ambulatory Visit (INDEPENDENT_AMBULATORY_CARE_PROVIDER_SITE_OTHER)

## 2024-04-15 ENCOUNTER — Ambulatory Visit: Admitting: Nurse Practitioner

## 2024-04-15 VITALS — BP 96/60 | HR 86 | Temp 98.0°F | Ht 67.0 in | Wt 147.4 lb

## 2024-04-15 DIAGNOSIS — R0781 Pleurodynia: Secondary | ICD-10-CM | POA: Diagnosis not present

## 2024-04-15 NOTE — Progress Notes (Signed)
 How can I help you today sure if microfracture  Acute Office Visit  Subjective:     Patient ID: Brittany Wade  Brittany Wade, female    DOB: 2001-04-07, 23 y.o.   MRN: [id number]  Chief Complaint  Patient presents with   Chest Pain    Left rib pain started Friday. Unsure if caused by lifting her 23 y/o    HPI Congetta  L. Pelland is a 23 year old female who presents on 04/15/2024 for an acute visit with concerns of left rib pain. She reports that the pain began over the weekend and has worsened over the past few days. She denies any history of trauma but notes, I have a two-year-old--could be something I did with him. The pain is described as 8/10 in severity, sharp and achy at times, and is aggravated by breathing or coughing. She has taken acetaminophen  and ibuprofen  without relief. She agrees to obtain a chest/rib X-ray for further evaluation.  LMP 04/03/2024  Active Ambulatory Problems    Diagnosis Date Noted   Anxiety state 04/04/2015   Panic attack 04/04/2015   Gastroesophageal reflux disease without esophagitis 05/26/2018   Hidradenitis suppurativa 09/30/2018   Hyperlipidemia associated with type 2 diabetes mellitus (HCC) 10/04/2020   Abnormal Pap smear of cervix 02/13/2021   Type 2 diabetes mellitus without complication, with long-term current use of insulin  (HCC) 08/05/2021   History of primary cesarean section 08/07/2021   Type 1 diabetes mellitus without complications (HCC) 02/28/2022   Positive GAD antibody 02/28/2022   Rib pain on left side 04/15/2024   Resolved Ambulatory Problems    Diagnosis Date Noted   Cervical paraspinal muscle spasm 04/04/2015   Moderate headache 04/04/2015   Circadian rhythm disorder 04/04/2015   Tremor 04/04/2015   Migraine 05/21/2018   Sexually transmitted disease exposure 05/26/2018   Supervision of high-risk pregnancy 02/07/2021   Marijuana use 02/08/2021   Polyhydramnios 07/03/2021   Large for gestational age fetus affecting management of mother,  antepartum, third trimester 08/05/2021   Gestational hypertension 08/06/2021   Past Medical History:  Diagnosis Date   Constipation    Diabetes mellitus without complication (HCC)     ROS Constitutional: Denies fever, chills, night sweats, or weight loss.  Respiratory: Denies shortness of breath or hemoptysis. Reports pain with deep breathing and coughing.  Cardiovascular: Denies chest pressure, palpitations, or syncope.  Musculoskeletal: Reports localized left rib pain, no swelling or visible deformity.  Skin: Denies rash, bruising, or erythema over the rib area.    Objective:    BP 96/60   Pulse 86   Temp 98 F (36.7 C) (Temporal)   Ht 5' 7 (1.702 m)   Wt 147 lb 6.4 oz (66.9 kg)   SpO2 96%   BMI 23.09 kg/m  BP Readings from Last 3 Encounters:  04/15/24 96/60  07/10/22 119/69  02/28/22 118/82   Wt Readings from Last 3 Encounters:  04/15/24 147 lb 6.4 oz (66.9 kg)  07/10/22 180 lb (81.6 kg)  02/28/22 187 lb (84.8 kg)      Physical Exam General: Alert, oriented, no acute distress.  Chest/Lungs: Symmetrical chest rise. Breath sounds clear bilaterally. Pain reproducible with palpation over the left mid-axillary rib area. No crepitus, step-off deformity, or visible swelling. Cardiovascular: Regular rate and rhythm, no murmurs, rubs, or gallops. Skin: Intact over rib area, no erythema, ecchymosis, or rash. Musculoskeletal: Tenderness to palpation over affected ribs; no deformity noted. Pertinent labs & imaging results that were available during my care of the patient were reviewed by  me and considered in my medical decision making.  No results found for any visits on 04/15/24.      Assessment & Plan:  Rib pain on left side -     DG Ribs Unilateral W/Chest Left   [name]  is a 23 year old Caucasian female seen today for left rib pain, no acute distress Left rib pain, likely musculoskeletal vs. Fracture -Order chest and rib X-ray (left side) to evaluate for  fracture or other pathology, awaiting final reading from radiology  -Continue supportive care: alternating acetaminophen  and NSAIDs as tolerated.  -Apply warm compresses to affected area.  -Avoid activities that exacerbate pain.  -call the clinic if symptoms worsen shortness of breath, fever.    The above assessment and management plan was discussed with the patient. The patient verbalized understanding of and has agreed to the management plan. Patient is aware to call the clinic if they develop any new symptoms or if symptoms persist or worsen. Patient is aware when to return to the clinic for a follow-up visit. Patient educated on when it is appropriate to go to the emergency department.  Return if symptoms worsen or fail to improve.  Asanti Craigo [place] Thompson, DNP [facility] [address] [place], [place] [address] [phone]  Note: This document was prepared by [employer] voice dictation technology and any errors that results from this process are unintentional.

## 2024-04-20 ENCOUNTER — Ambulatory Visit: Payer: Self-pay | Admitting: Nurse Practitioner

## 2024-04-20 ENCOUNTER — Ambulatory Visit: Payer: Self-pay

## 2024-04-20 ENCOUNTER — Encounter: Payer: Self-pay | Admitting: Family Medicine

## 2024-04-20 NOTE — Telephone Encounter (Signed)
 FYI noted sent to ed

## 2024-04-20 NOTE — Telephone Encounter (Signed)
 FYI Only or Action Required?: Action required by provider: Sent to ED for elevated glucose, 583.  Patient was last seen in primary care on 04/15/2024 by Brittany Morton Sebastian Nena, NP.  Called Nurse Triage reporting Blood Sugar Problem.  Symptoms began today.  Interventions attempted: Prescription medications: Humulin R  25 units this morning.  Symptoms are: gradually worsening.  Triage Disposition: Go to ED Now (Notify PCP)  Patient/caregiver understands and will follow disposition?: Yes      Copied from CRM [id number]. Topic: Clinical - Red Word Triage >> Apr 20, 2024 12:10 PM Brittany Wade wrote: [facility] that prompted transfer to Nurse Triage: PT goes into work at [employer] and needs note before work.   Normal range is 300,   last reading 485 - this morning   My chart message from pt: Brittany Wade to [facility] (supporting Brittany CHRISTELLA Bruns, FNP)     04/20/24 12:06 PM Is there any way I could possibly get a doctors note for today? As you know I'm type 1 diabetic and I have had a problem with my teeth today and sugar, is there any way you could send me back a message to show for work for a doctors note? Where I have problems with my teeth ive had badd problems since 5 this morning with swelling and pain, which ends up raising my sugar, I am suppose to go to work this afternoon and with the pain and swelling brings higher readings even with insulin , just in case is there's any way you can send me a message back to excuse me from work for today just in case I need to. It's been going on all day so far and I can't help it: Reason for Disposition  [1] Blood glucose > 240 mg/dL (86.6 mmol/L) AND [7] rapid breathing  Answer Assessment - Initial Assessment Questions Instructed patient to go to ED. Offered to call 911, patient declined. Patient states will get ride to ED, [facility].   Patient reports new elevated glucose, usually run 300s, current reading 536. Morning reading 400s  before eating Took around 1045- [place] R-; 25 units Rechecked 483 glucose, aroung 0900; last time ate Current reading 536 Tooth infection-swelling for over week, pain, throbbing; 9/10; swelling under jawline Taking Tylenol  and Ibuprofen  Fever-no fever  1. BLOOD GLUCOSE: What is your blood glucose level?      536 2. ONSET: When did you check the blood glucose?     now 3. USUAL RANGE: What is your glucose level usually? (e.g., usual fasting morning value, usual evening value)     Twice daily 4. KETONES: Do you check for ketones (urine or blood test strips)? If Yes, ask: What does the test show now?      none 5. TYPE 1 or 2:  Do you know what type of diabetes you have?  (e.g., Type 1, Type 2, Gestational; doesn't know)      Type I 6. INSULIN : Do you take insulin ? What type of insulin (s) do you use? What is the mode of delivery? (syringe, pen; injection or pump)?      injection 7. DIABETES PILLS: Do you take any pills for your diabetes? If Yes, ask: Have you missed taking any pills recently?     No;missed injection Saturday 8. OTHER SYMPTOMS: Do you have any symptoms? (e.g., fever, frequent urination, difficulty breathing, dizziness, weakness, vomiting)     no 9. PREGNANCY: Is there any chance you are pregnant? When was your last menstrual  period?     no  Protocols used: Diabetes - High Blood Sugar-A-AH

## 2024-04-20 NOTE — Telephone Encounter (Signed)
Defer to PCP return °

## 2024-04-24 ENCOUNTER — Encounter: Payer: Self-pay | Admitting: Radiology

## 2024-04-28 ENCOUNTER — Telehealth: Payer: Self-pay | Admitting: Family Medicine

## 2024-04-28 NOTE — Telephone Encounter (Signed)
 Copied from CRM [id number]. Topic: Clinical - Medication Question >> Apr 28, 2024  9:37 AM Delon DASEN wrote: Reason for CRM: Would like to go ahead with the [facility]- please call [phone]

## 2024-04-28 NOTE — Telephone Encounter (Signed)
 Patient states that no one has contacted her about this referral.

## 2024-04-29 ENCOUNTER — Other Ambulatory Visit: Payer: Self-pay | Admitting: Family Medicine

## 2024-04-29 DIAGNOSIS — E109 Type 1 diabetes mellitus without complications: Secondary | ICD-10-CM

## 2024-04-29 NOTE — Telephone Encounter (Signed)
 No Referral placed at this time.  Last Referral to Endocrinology was placed 2 years ago.

## 2024-04-29 NOTE — Telephone Encounter (Signed)
 Patient aware and verbalizes understanding.

## 2024-04-29 NOTE — Telephone Encounter (Signed)
 Patient called to f/u on Endocrinologist referral but do not see the referral. Please f/u with patient to let her know where in the process we are as she is ready to start on the Dexcom

## 2024-04-30 NOTE — Telephone Encounter (Signed)
 Referral sent to: [facility] [phone] [address] [address] [phone]  MyChart Message sent to Patient with Specialty Office contact information.

## 2024-05-11 ENCOUNTER — Other Ambulatory Visit: Payer: Self-pay | Admitting: Family Medicine

## 2024-05-11 DIAGNOSIS — E109 Type 1 diabetes mellitus without complications: Secondary | ICD-10-CM

## 2024-05-11 NOTE — Telephone Encounter (Signed)
 [facility]akes NTBS last OV 02/28/22 NO RF sent to pharmacy last OV greater than a year

## 2024-05-11 NOTE — Telephone Encounter (Signed)
 Pt called & she made an appt on 05-12-2024 w/Rakes for med refill.

## 2024-05-11 NOTE — Telephone Encounter (Signed)
 Copied from CRM [id number]. Topic: Clinical - Medication Refill >> May 11, 2024  1:23 PM Wess RAMAN wrote: Medication: HUMULIN R  100 UNIT/ML injection   Has the patient contacted their pharmacy? Yes (Agent: If no, request that the patient contact the pharmacy for the refill. If patient does not wish to contact the pharmacy document the reason why and proceed with request.) (Agent: If yes, when and what did the pharmacy advise?) They are out of insulin  and waiting for shipment  This is the patient's preferred pharmacy:   [id number] - [place], [place] - [address] [place] [address] Phone: [phone] Fax: [phone]  Is this the correct pharmacy for this prescription? Yes If no, delete pharmacy and type the correct one.   Has the prescription been filled recently? Yes  Is the patient out of the medication? Yes  Has the patient been seen for an appointment in the last year OR does the patient have an upcoming appointment? Yes  Can we respond through MyChart? Yes  Agent: Please be advised that Rx refills may take up to 3 business days. We ask that you follow-up with your pharmacy.

## 2024-05-12 ENCOUNTER — Encounter: Payer: Self-pay | Admitting: Family Medicine

## 2024-05-12 ENCOUNTER — Ambulatory Visit: Admitting: Family Medicine

## 2024-05-12 VITALS — BP 117/60 | HR 90 | Temp 97.6°F | Ht 67.0 in | Wt 160.2 lb

## 2024-05-12 DIAGNOSIS — E785 Hyperlipidemia, unspecified: Secondary | ICD-10-CM

## 2024-05-12 DIAGNOSIS — E1069 Type 1 diabetes mellitus with other specified complication: Secondary | ICD-10-CM

## 2024-05-12 DIAGNOSIS — E108 Type 1 diabetes mellitus with unspecified complications: Secondary | ICD-10-CM

## 2024-05-12 DIAGNOSIS — J4520 Mild intermittent asthma, uncomplicated: Secondary | ICD-10-CM | POA: Insufficient documentation

## 2024-05-12 LAB — BAYER DCA HB A1C WAIVED: HB A1C (BAYER DCA - WAIVED): 10.9 % — ABNORMAL HIGH (ref 4.8–5.6)

## 2024-05-12 LAB — LIPID PANEL

## 2024-05-12 MED ORDER — ALBUTEROL SULFATE HFA 108 (90 BASE) MCG/ACT IN AERS: INHALATION_SPRAY | RESPIRATORY_TRACT | 1 refills | Status: AC

## 2024-05-12 MED ORDER — INSULIN REGULAR HUMAN 100 UNIT/ML IJ SOLN: 20.0000 [IU] | Freq: Two times a day (BID) | INTRAMUSCULAR | 3 refills | Status: DC
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
## 2024-05-12 NOTE — Telephone Encounter (Signed)
 Refilled at today's visit.

## 2024-05-13 ENCOUNTER — Encounter (HOSPITAL_COMMUNITY): Payer: Self-pay

## 2024-05-13 ENCOUNTER — Ambulatory Visit: Payer: Self-pay | Admitting: Family Medicine

## 2024-05-13 ENCOUNTER — Other Ambulatory Visit: Payer: Self-pay

## 2024-05-13 ENCOUNTER — Emergency Department (HOSPITAL_COMMUNITY)
Admission: EM | Admit: 2024-05-13 | Discharge: 2024-05-13 | Disposition: A | Discharge status: Home / Self Care | Attending: Emergency Medicine | Admitting: Emergency Medicine

## 2024-05-13 ENCOUNTER — Ambulatory Visit: Payer: Self-pay

## 2024-05-13 DIAGNOSIS — N920 Excessive and frequent menstruation with regular cycle: Secondary | ICD-10-CM | POA: Diagnosis not present

## 2024-05-13 DIAGNOSIS — E119 Type 2 diabetes mellitus without complications: Secondary | ICD-10-CM | POA: Diagnosis not present

## 2024-05-13 DIAGNOSIS — D649 Anemia, unspecified: Secondary | ICD-10-CM | POA: Diagnosis not present

## 2024-05-13 DIAGNOSIS — N939 Abnormal uterine and vaginal bleeding, unspecified: Secondary | ICD-10-CM | POA: Diagnosis present

## 2024-05-13 DIAGNOSIS — Z794 Long term (current) use of insulin: Secondary | ICD-10-CM | POA: Insufficient documentation

## 2024-05-13 LAB — CBC WITH DIFFERENTIAL/PLATELET
Abs Immature Granulocytes: 0.03 K/uL (ref 0.00–0.07)
Basophils Absolute: 0.1 x10E3/uL (ref 0.0–0.2)
Basophils Absolute: 0.1 K/uL (ref 0.0–0.1)
Basophils Relative: 1 %
Basos: 1 %
EOS (ABSOLUTE): 0.1 x10E3/uL (ref 0.0–0.4)
Eos: 1 %
Eosinophils Absolute: 0.1 K/uL (ref 0.0–0.5)
Eosinophils Relative: 1 %
HCT: 31.1 % — ABNORMAL LOW (ref 36.0–46.0)
Hematocrit: 27.9 % — ABNORMAL LOW (ref 34.0–46.6)
Hemoglobin: 7.8 g/dL — CL (ref 11.1–15.9)
Hemoglobin: 8.7 g/dL — ABNORMAL LOW (ref 12.0–15.0)
Immature Grans (Abs): 0 x10E3/uL (ref 0.0–0.1)
Immature Granulocytes: 0 %
Immature Granulocytes: 0 %
Lymphocytes Absolute: 4.3 x10E3/uL — ABNORMAL HIGH (ref 0.7–3.1)
Lymphocytes Relative: 31 %
Lymphs Abs: 3 K/uL (ref 0.7–4.0)
Lymphs: 36 %
MCH: 21.2 pg — ABNORMAL LOW (ref 26.6–33.0)
MCH: 21.4 pg — ABNORMAL LOW (ref 26.0–34.0)
MCHC: 28 g/dL — ABNORMAL LOW (ref 31.5–35.7)
MCHC: 28 g/dL — ABNORMAL LOW (ref 30.0–36.0)
MCV: 76 fL — ABNORMAL LOW (ref 79–97)
MCV: 76.4 fL — ABNORMAL LOW (ref 80.0–100.0)
Monocytes Absolute: 0.8 x10E3/uL (ref 0.1–0.9)
Monocytes Absolute: 0.8 K/uL (ref 0.1–1.0)
Monocytes Relative: 9 %
Monocytes: 7 %
Neutro Abs: 5.6 K/uL (ref 1.7–7.7)
Neutrophils Absolute: 6.7 x10E3/uL (ref 1.4–7.0)
Neutrophils Relative %: 58 %
Neutrophils: 55 %
Platelets: 353 x10E3/uL (ref 150–450)
Platelets: 366 K/uL (ref 150–400)
RBC: 3.68 x10E6/uL — ABNORMAL LOW (ref 3.77–5.28)
RBC: 4.07 MIL/uL (ref 3.87–5.11)
RDW: 16.5 % — ABNORMAL HIGH (ref 11.7–15.4)
RDW: 17.5 % — ABNORMAL HIGH (ref 11.5–15.5)
WBC: 12 x10E3/uL — ABNORMAL HIGH (ref 3.4–10.8)
WBC: 9.6 K/uL (ref 4.0–10.5)
nRBC: 0 % (ref 0.0–0.2)

## 2024-05-13 LAB — CMP14+EGFR
ALT: 10 IU/L (ref 0–32)
AST: 15 IU/L (ref 0–40)
Albumin: 4.3 g/dL (ref 4.0–5.0)
Alkaline Phosphatase: 72 IU/L (ref 44–121)
BUN/Creatinine Ratio: 18 (ref 9–23)
BUN: 10 mg/dL (ref 6–20)
Bilirubin Total: <0.2 mg/dL (ref 0.0–1.2)
CO2: 22 mmol/L (ref 20–29)
Calcium: 9.2 mg/dL (ref 8.7–10.2)
Chloride: 101 mmol/L (ref 96–106)
Creatinine, Ser: 0.55 mg/dL — AB (ref 0.57–1.00)
Globulin, Total: 2.3 g/dL (ref 1.5–4.5)
Glucose: 286 mg/dL — AB (ref 70–99)
Potassium: 4.2 mmol/L (ref 3.5–5.2)
Sodium: 137 mmol/L (ref 134–144)
Total Protein: 6.6 g/dL (ref 6.0–8.5)
eGFR: 131 mL/min/1.73 (ref >59)

## 2024-05-13 LAB — BASIC METABOLIC PANEL WITH GFR
Anion gap: 11 (ref 5–15)
BUN: 11 mg/dL (ref 6–20)
CO2: 25 mmol/L (ref 22–32)
Calcium: 8.9 mg/dL (ref 8.9–10.3)
Chloride: 102 mmol/L (ref 98–111)
Creatinine, Ser: 0.54 mg/dL (ref 0.44–1.00)
GFR, Estimated: >60 mL/min (ref >60)
Glucose, Bld: 145 mg/dL — ABNORMAL HIGH (ref 70–99)
Potassium: 3.6 mmol/L (ref 3.5–5.1)
Sodium: 138 mmol/L (ref 135–145)

## 2024-05-13 LAB — LIPID PANEL
Cholesterol, Total: 185 mg/dL (ref 100–199)
HDL: 46 mg/dL (ref >39)
LDL CALC COMMENT:: 4 ratio (ref 0.0–4.4)
LDL Chol Calc (NIH): 102 mg/dL — AB (ref 0–99)
Triglycerides: 216 mg/dL — AB (ref 0–149)
VLDL Cholesterol Cal: 37 mg/dL (ref 5–40)

## 2024-05-13 LAB — MICROALBUMIN / CREATININE URINE RATIO
Creatinine, Urine: 46.9 mg/dL
Microalb/Creat Ratio: 38 mg/g{creat} — ABNORMAL HIGH (ref 0–29)
Microalbumin, Urine: 17.6 ug/mL

## 2024-05-13 LAB — HCG, QUANTITATIVE, PREGNANCY: hCG, Beta Chain, Quant, S: <1 m[IU]/mL (ref <5)

## 2024-05-13 LAB — TYPE AND SCREEN
ABO/RH(D): A POS
Antibody Screen: NEGATIVE

## 2024-05-13 NOTE — ED Provider Notes (Signed)
 [place] EMERGENCY DEPARTMENT AT [facility] Provider Note   CSN: [id number] Arrival date & time: 05/13/24  1415     History  Chief Complaint  Patient presents with   blood transfusion    Brittany Wade is a 23 y.o. female with PMH as listed below who presents with concern for anemia and vaginal bleeding.  Patient states that she normally has heavy menstrual periods.  This has been going on for a while.  She is G1, P1 and does not currently take any birth control or hormones.  She does not have any lightheadedness, shortness of breath, chest pain, or passing out.  No bleeding from anywhere else.  She reports that she is on day 4 of her normal menstrual cycle and she is bleeding consistent with her normal menstrual period which she considers to be heavy.  She had her labs drawn recently and was told to come to the emergency department because her hemoglobin was 7.8 at that time and she was told she may need a blood transfusion.  She has never had a blood transfusion before.  Patient also has a history of type 1 diabetes and no concerns with her blood sugar at this time.  She denies any nausea vomiting, abdominal pain, vaginal discharge itching or burning, vaginal trauma, hematuria or dysuria, rectal bleeding, STD exposures.  Uses condoms for birth control currently.  No history of coagulopathy.   Past Medical History:  Diagnosis Date   Circadian rhythm disorder 04/04/2015   Constipation    Diabetes mellitus without complication (HCC)    Migraine 05/21/2018   Tremor 04/04/2015       Home Medications Prior to Admission medications   Medication Sig Start Date End Date Taking? Authorizing Provider  Accu-Chek Softclix Lancets lancets Use as instructed to check blood sugar 4 times daily 12/29/20   Kizzie Suzen SAUNDERS, CNM  albuterol  (VENTOLIN  HFA) 108 (90 Base) MCG/ACT inhaler USE 2 PUFFS EVERY 6 HOURS AS NEEDED 05/12/24   Rakes, Rock HERO, FNP  glucose blood (ACCU-CHEK GUIDE) test strip  USE AS DIRECTED TO CHECK BLOOD SUGAR 4 TIMES DAILY 06/18/22   Severa Rock HERO, FNP  insulin  regular (HUMULIN R ) 100 units/mL injection Inject 0.2 mLs (20 Units total) into the skin 2 (two) times daily before a meal. 05/12/24   Severa Rock HERO, FNP  TRUEPLUS INSULIN  SYRINGE 31G X 5/16 0.5 ML MISC USE AS DIRECTED 08/21/23   Severa Rock HERO, FNP      Allergies    Codeine    Review of Systems   Review of Systems A 10 point review of systems was performed and is negative unless otherwise reported in HPI.  Physical Exam Updated Vital Signs BP 114/67   Pulse 87   Temp 98.9 F (37.2 C)   Resp 16   Ht 5' 7 (1.702 m)   Wt 72.7 kg   LMP 05/10/2024 (Approximate)   SpO2 100%   BMI 25.09 kg/m  Physical Exam General: Normal appearing female, lying in bed.  HEENT: PERRLA, Sclera anicteric, MMM, trachea midline.  Cardiology: RRR Resp: Normal respiratory rate and effort. Abd: Soft, non-tender, non-distended. No rebound tenderness or guarding.  GU: Deferred. MSK: No peripheral edema or signs of trauma. Extremities without deformity or TTP. No cyanosis or clubbing. Skin: warm, dry. N Neuro: A&Ox4, CNs II-XII grossly intact. MAEs. Sensation grossly intact.  Psych: Normal mood and affect.   ED Results / Procedures / Treatments   Labs (all labs ordered are  listed, but only abnormal results are displayed) Labs Reviewed  CBC WITH DIFFERENTIAL/PLATELET - Abnormal; Notable for the following components:      Result Value   Hemoglobin 8.7 (*)    HCT 31.1 (*)    MCV 76.4 (*)    MCH 21.4 (*)    MCHC 28.0 (*)    RDW 17.5 (*)    All other components within normal limits  BASIC METABOLIC PANEL WITH GFR - Abnormal; Notable for the following components:   Glucose, Bld 145 (*)    All other components within normal limits  HCG, QUANTITATIVE, PREGNANCY  TYPE AND SCREEN    EKG None  Radiology No results found.  Procedures Procedures    Medications Ordered in ED Medications - No data to  display  ED Course/ Medical Decision Making/ A&P                          Medical Decision Making Amount and/or Complexity of Data Reviewed Labs: ordered. Decision-making details documented in ED Course.    This patient presents to the ED for concern of anemia and vaginal bleeding, this involves an extensive number of treatment options, and is a complaint that carries with it a high risk of complications and morbidity.  I considered the following differential and admission for this acute, potentially life threatening condition.  Patient is overall very well here appearing and hemodynamically stable  MDM:    In nonpregnant females, vaginal bleeding consider:  Structural causes such as polyps, adenomyosis, leiomyomas, malignancy/hyperplasia Nonstructural causes such as coagulopathies, ovulatory dysfunction, endometrial bleeding, iatrogenic causes  Patient states that she normally has heavy menstrual periods and the bleeding she is currently experiencing is consistent with her normal menstrual cycle.  She denies any pain that would indicate endometriosis, PID/TOA.  No vaginal discharge.  No vaginal trauma.  No bleeding from anywhere else.  Her hemoglobin on that test from yesterday was 7.8 but today it is 8.7.  She has microcytic anemia.  Discussed with the patient about having her hemoglobin rechecked in 1 week and about not needing a blood transfusion today.  Performed shared decision making with the patient about a pelvic exam and given she has no abnormal symptoms, no pain, no vaginal trauma, and she is not pregnant, believe we can forego the pelvic exam today.  It is possible that she may need to be started on birth control to help control her menorrhagia or be started on iron  supplementation and consultation with her primary care physician or with a gynecologist.  Patient is very well-appearing and hemodynamically stable, no abdominal tenderness outpatient.  She is stable for outpatient  follow-up.  Clinical Course as of 05/13/24 1820  Wed May 13, 2024  1759 HCG, Beta Chain, Quant, S: <1 neg [HN]  1814 Hemoglobin(!): 8.7 [HN]  1814 Glucose(!): 145 [HN]  1814 ABO/RH(D): A POS [HN]    Clinical Course User Index [HN] Franklyn Sid SAILOR, MD    Labs: I Ordered, and personally interpreted labs.  The pertinent results include: Those listed above Additional history obtained from chart review, father at bedside.    Social Determinants of Health:  lives independently  Disposition:  DC w/ discharge instructions/return precautions. All questions answered to patient's satisfaction.    Co morbidities that complicate the patient evaluation  Past Medical History:  Diagnosis Date   Circadian rhythm disorder 04/04/2015   Constipation    Diabetes mellitus without complication (HCC)    Migraine  05/21/2018   Tremor 04/04/2015     Medicines No orders of the defined types were placed in this encounter.   I have reviewed the patients home medicines and have made adjustments as needed  Problem List / ED Course: Problem List Items Addressed This Visit   None Visit Diagnoses       Menorrhagia with regular cycle    -  Primary     Anemia, unspecified type                       This note was created using dictation software, which may contain spelling or grammatical errors.    Franklyn Sid SAILOR, MD 05/13/24 [phone]

## 2024-05-13 NOTE — Telephone Encounter (Signed)
 FYI NOTED

## 2024-05-13 NOTE — Discharge Instructions (Signed)
 Thank you for coming to [facility] Emergency Department. You were seen for heavy menstrual period and low hemoglobin (anemia).  Your hemoglobin today was 8.7.  You do not need a blood transfusion today.  Please follow-up within 1 week for recheck of your hemoglobin with either your primary care physician or your gynecologist.  You may need to start iron  supplementation or birth control to help with this.   Do not hesitate to return to the ED or call 911 if you experience: -Worsening symptoms -Saturating greater than 2 pads or tampons per hour for greater than 2 hours -Severe abdominal pain, nausea or vomiting -Lightheadedness, passing out -Fevers/chills -Anything else that concerns you

## 2024-05-13 NOTE — Telephone Encounter (Signed)
  FYI Only or Action Required?: FYI only for provider.  Patient was last seen in primary care on 05/12/2024 by Severa Rock HERO, FNP.  Called Nurse Triage reporting Results.  Symptoms began today.  Interventions attempted: Nothing.  Symptoms are: stable.  Triage Disposition: No disposition on file.  Patient/caregiver understands and will follow disposition?:     Copied from CRM [id number]. Topic: General - Other >> May 13, 2024  1:53 PM Brittany Wade wrote: Reason for CRM: Pt going to ER [facility]  at [place], [place] needs records labs hemoglobin low, warm transferred to clinic. Reason for Disposition  Nursing judgment or information in reference  Answer Assessment - Initial Assessment Questions 1. REASON FOR CALL: What is your main concern right now?     On way to [facility] and wanted lab results sent over to them.  This RN explained that it is part of the [facility] system and her results are in Epic.  Denies questions.  Protocols used: No Guideline Available-A-AH

## 2024-05-13 NOTE — ED Triage Notes (Signed)
 Pt arrived via POV from home following a phone call from her PCP office advising her to go to the hospital for a possible blood transfusion. Pt reports having lab work done yesterday and was told her Hgb was low. Pt reports currently having a large/ heavy menstrual cycle right now and is passing very large, 'palm-size' blood clot today. Pt denies SOB, denies fatigue.

## 2024-05-14 ENCOUNTER — Encounter: Payer: Self-pay | Admitting: Family Medicine

## 2024-05-27 ENCOUNTER — Telehealth: Admitting: Physician Assistant

## 2024-05-27 ENCOUNTER — Telehealth: Payer: Self-pay | Admitting: Family Medicine

## 2024-05-27 ENCOUNTER — Encounter: Payer: Self-pay | Admitting: Obstetrics & Gynecology

## 2024-05-27 ENCOUNTER — Encounter

## 2024-05-27 DIAGNOSIS — E109 Type 1 diabetes mellitus without complications: Secondary | ICD-10-CM

## 2024-05-27 DIAGNOSIS — B3731 Acute candidiasis of vulva and vagina: Secondary | ICD-10-CM | POA: Diagnosis not present

## 2024-05-27 MED ORDER — ONETOUCH VERIO VI STRP: ORAL_STRIP | 0 refills | Status: DC

## 2024-05-27 MED ORDER — LEVONORGEST-ETH ESTRAD 91-DAY 0.15-0.03 MG PO TABS: 1.0000 | ORAL_TABLET | Freq: Every day | ORAL | 4 refills | Status: AC

## 2024-05-27 MED ORDER — FLUCONAZOLE 150 MG PO TABS: 150.0000 mg | ORAL_TABLET | ORAL | 0 refills | Status: DC | PRN

## 2024-05-27 NOTE — Patient Instructions (Signed)
 Ilse  [name], thank you for joining Delon CHRISTELLA Dickinson, PA-C for today's virtual visit.  While this provider is not your primary care provider (PCP), if your PCP is located in our provider database this encounter information will be shared with them immediately following your visit.   A [facility] MyChart account gives you access to today's visit and all your visits, tests, and labs performed at [facility]  click here if you don't have a [facility] MyChart account or go to [email or web address]  Consent: (Patient) Brittany Wade provided verbal consent for this virtual visit at the beginning of the encounter.  Current Medications:  Current Outpatient Medications:    fluconazole  (DIFLUCAN ) 150 MG tablet, Take 1 tablet (150 mg total) by mouth every 3 (three) days as needed., Disp: 2 tablet, Rfl: 0   albuterol  (VENTOLIN  HFA) 108 (90 Base) MCG/ACT inhaler, USE 2 PUFFS EVERY 6 HOURS AS NEEDED, Disp: 8.5 g, Rfl: 1   glucose blood (ONETOUCH VERIO) test strip, Use to check blood sugar before each meal and at bedtime daily for T1DM, Disp: 100 each, Rfl: 0   insulin  regular (HUMULIN R ) 100 units/mL injection, Inject 0.2 mLs (20 Units total) into the skin 2 (two) times daily before a meal., Disp: 10 mL, Rfl: 3   TRUEPLUS INSULIN  SYRINGE 31G X 5/16 0.5 ML MISC, USE AS DIRECTED, Disp: 200 each, Rfl: 3   Medications ordered in this encounter:  Meds ordered this encounter  Medications   DISCONTD: glucose blood (ONETOUCH VERIO) test strip    Sig: Use as instructed    Dispense:  100 each    Refill:  0    Supervising Provider:   BLAISE ALEENE KIDD [[id number]]   fluconazole  (DIFLUCAN ) 150 MG tablet    Sig: Take 1 tablet (150 mg total) by mouth every 3 (three) days as needed.    Dispense:  2 tablet    Refill:  0    Supervising Provider:   BLAISE ALEENE KIDD [[id number]]   glucose blood (ONETOUCH VERIO) test strip    Sig: Use to check blood sugar before each meal and at bedtime daily  for T1DM    Dispense:  100 each    Refill:  0    Supervising Provider:   BLAISE ALEENE KIDD [phone]     *If you need refills on other medications prior to your next appointment, please contact your pharmacy*  Follow-Up: Call back or seek an in-person evaluation if the symptoms worsen or if the condition fails to improve as anticipated.  [facility] [phone]  Other Instructions Vaginal Yeast Infection, Adult  Vaginal yeast infection is a condition that causes vaginal discharge as well as soreness, swelling, and redness (inflammation) of the vagina. This is a common condition. Some women get this infection frequently. What are the causes? This condition is caused by a change in the normal balance of the yeast (Candida) and normal bacteria that live in the vagina. This change causes an overgrowth of yeast, which causes the inflammation. What increases the risk? The condition is more likely to develop in women who: Take antibiotic medicines. Have diabetes. Take birth control pills. Are pregnant. Douche often. Have a weak body defense system (immune system). Have been taking steroid medicines for a long time. Frequently wear tight clothing. What are the signs or symptoms? Symptoms of this condition include: White, thick, creamy vaginal discharge. Swelling, itching, redness, and irritation of the vagina. The lips of the vagina (labia) may be  affected as well. Pain or a burning feeling while urinating. Pain during sex. How is this diagnosed? This condition is diagnosed based on: Your medical history. A physical exam. A pelvic exam. Your health care provider will examine a sample of your vaginal discharge under a microscope. Your health care provider may send this sample for testing to confirm the diagnosis. How is this treated? This condition is treated with medicine. Medicines may be over-the-counter or prescription. You may be told to use one or more of the  following: Medicine that is taken by mouth (orally). Medicine that is applied as a cream (topically). Medicine that is inserted directly into the vagina (suppository). Follow these instructions at home: Take or apply over-the-counter and prescription medicines only as told by your health care provider. Do not use tampons until your health care provider approves. Do not have sex until your infection has cleared. Sex can prolong or worsen your symptoms of infection. Ask your health care provider when it is safe to resume sexual activity. Keep all follow-up visits. This is important. How is this prevented?  Do not wear tight clothes, such as pantyhose or tight pants. Wear breathable cotton underwear. Do not use douches, perfumed soap, creams, or powders. Wipe from front to back after using the toilet. If you have diabetes, keep your blood sugar levels under control. Ask your health care provider for other ways to prevent yeast infections. Contact a health care provider if: You have a fever. Your symptoms go away and then return. Your symptoms do not get better with treatment. Your symptoms get worse. You have new symptoms. You develop blisters in or around your vagina. You have blood coming from your vagina and it is not your menstrual period. You develop pain in your abdomen. Summary Vaginal yeast infection is a condition that causes discharge as well as soreness, swelling, and redness (inflammation) of the vagina. This condition is treated with medicine. Medicines may be over-the-counter or prescription. Take or apply over-the-counter and prescription medicines only as told by your health care provider. Do not douche. Resume sexual activity or use of tampons as instructed by your health care provider. Contact a health care provider if your symptoms do not get better with treatment or your symptoms go away and then return. This information is not intended to replace advice given to you  by your health care provider. Make sure you discuss any questions you have with your health care provider. Document Revised: 11/07/2020 Document Reviewed: 11/07/2020 Elsevier Patient Education  2024 Elsevier Inc.   If you have been instructed to have an in-person evaluation today at a local Urgent Care facility, please use the link below. It will take you to a list of all of our available [facility], including address, phone number and hours of operation. Please do not delay care.  [facility]  If you or a family member do not have a primary care provider, use the link below to schedule a visit and establish care. When you choose a [facility] primary care physician or advanced practice provider, you gain a long-term partner in health. Find a Primary Care Provider  Learn more about [facility] in-office and virtual care options: [facility] - Get Care Now

## 2024-05-27 NOTE — Progress Notes (Signed)
 Virtual Visit Consent   Brittany  L Wade, you are scheduled for a virtual visit with a [facility] provider today. Just as with appointments in the office, your consent must be obtained to participate. Your consent will be active for this visit and any virtual visit you may have with one of our providers in the next 365 days. If you have a MyChart account, a copy of this consent can be sent to you electronically.  As this is a virtual visit, video technology does not allow for your provider to perform a traditional examination. This may limit your provider's ability to fully assess your condition. If your provider identifies any concerns that need to be evaluated in person or the need to arrange testing (such as labs, EKG, etc.), we will make arrangements to do so. Although advances in technology are sophisticated, we cannot ensure that it will always work on either your end or our end. If the connection with a video visit is poor, the visit may have to be switched to a telephone visit. With either a video or telephone visit, we are not always able to ensure that we have a secure connection.  By engaging in this virtual visit, you consent to the provision of healthcare and authorize for your insurance to be billed (if applicable) for the services provided during this visit. Depending on your insurance coverage, you may receive a charge related to this service.  I need to obtain your verbal consent now. Are you willing to proceed with your visit today? Fawne  L Butterbaugh has provided verbal consent on 05/27/2024 for a virtual visit (video or telephone). Delon CHRISTELLA Dickinson, PA-C  Date: 05/27/2024 2:25 PM   Virtual Visit via Video Note   I, Delon CHRISTELLA Dickinson, connected with  Brittany Wade  ([id number], Sep 20, 2000) on 05/27/24 at  2:15 PM EDT by a video-enabled telemedicine application and verified that I am speaking with the correct person using two identifiers.  Location: Patient: Virtual Visit  Location Patient: Home Provider: Virtual Visit Location Provider: Home Office   I discussed the limitations of evaluation and management by telemedicine and the availability of in person appointments. The patient expressed understanding and agreed to proceed.    History of Present Illness: Lalania  L Rask is a 23 y.o. who identifies as a female who was assigned female at birth, and is being seen today for vaginal irritation.  HPI: Vaginal Itching The patient's primary symptoms include genital itching and vaginal discharge. The patient's pertinent negatives include no genital odor. This is a new problem. The current episode started in the past 7 days (after having intercourse with her boyfriend with a condom). The problem occurs constantly. The problem has been unchanged. The patient is experiencing no pain. Pertinent negatives include no abdominal pain. The vaginal discharge was white and thin. There has been no bleeding. She has not been passing clots. She has not been passing tissue. Nothing aggravates the symptoms. She has tried nothing for the symptoms. The treatment provided no relief. She is sexually active. No, her partner does not have an STD.    Needs a refill of her test strips for her glucose monitor.   Was also seen on 05/13/24 for menorrhagia with anemia. Advised to start OCP, which she is interested in. Advised we are unable to prescribe these medications at current state and that she should call her PCP office or OB/GYN office to schedule an appt to discuss her options.   Problems:  Patient Active Problem  List   Diagnosis Date Noted   Intermittent asthma without complication 05/12/2024   Rib pain on left side 04/15/2024   Type 1 diabetes mellitus without complications (HCC) 02/28/2022   Positive GAD antibody 02/28/2022   History of primary cesarean section 08/07/2021   Hyperlipidemia due to type 1 diabetes mellitus (HCC) 10/04/2020   Hidradenitis suppurativa 09/30/2018    Gastroesophageal reflux disease without esophagitis 05/26/2018   Anxiety state 04/04/2015   Panic attack 04/04/2015    Allergies:  No Active Allergies  Medications:  Current Outpatient Medications:    fluconazole  (DIFLUCAN ) 150 MG tablet, Take 1 tablet (150 mg total) by mouth every 3 (three) days as needed., Disp: 2 tablet, Rfl: 0   albuterol  (VENTOLIN  HFA) 108 (90 Base) MCG/ACT inhaler, USE 2 PUFFS EVERY 6 HOURS AS NEEDED, Disp: 8.5 g, Rfl: 1   glucose blood (ONETOUCH VERIO) test strip, Use to check blood sugar before each meal and at bedtime daily for T1DM, Disp: 100 each, Rfl: 0   insulin  regular (HUMULIN R ) 100 units/mL injection, Inject 0.2 mLs (20 Units total) into the skin 2 (two) times daily before a meal., Disp: 10 mL, Rfl: 3   TRUEPLUS INSULIN  SYRINGE 31G X 5/16 0.5 ML MISC, USE AS DIRECTED, Disp: 200 each, Rfl: 3  Observations/Objective: Patient is well-developed, well-nourished in no acute distress.  Resting comfortably at home.  Head is normocephalic, atraumatic.  No labored breathing.  Speech is clear and coherent with logical content.  Patient is alert and oriented at baseline.    Assessment and Plan: 1. Yeast vaginitis (Primary) - fluconazole  (DIFLUCAN ) 150 MG tablet; Take 1 tablet (150 mg total) by mouth every 3 (three) days as needed.  Dispense: 2 tablet; Refill: 0  2. Type 1 diabetes mellitus without complications (HCC) - glucose blood (ONETOUCH VERIO) test strip; Use to check blood sugar before each meal and at bedtime daily for T1DM  Dispense: 100 each; Refill: 0  - Symptoms consistent with yeast vaginitis - Fluconazole  prescribed - Limit bubble baths, scented lotions/soaps/detergents - Limit tight fitting clothing - Refilled test strips for one touch verio - Seek on person evaluation if not improving or if symptoms worsen   Follow Up Instructions: I discussed the assessment and treatment plan with the patient. The patient was provided an opportunity to  ask questions and all were answered. The patient agreed with the plan and demonstrated an understanding of the instructions.  A copy of instructions were sent to the patient via MyChart unless otherwise noted below.    The patient was advised to call back or seek an in-person evaluation if the symptoms worsen or if the condition fails to improve as anticipated.    Delon CHRISTELLA Dickinson, PA-C

## 2024-05-27 NOTE — Telephone Encounter (Signed)
 Copied from CRM #[id number]. Topic: Clinical - Medication Question >> May 27, 2024  2:31 PM Willma R wrote: Reason for CRM: Patient was in the ED on 09/10 and was advised she should contact her PCP to see about being prescribed birth control. Patient states due to work schedule she hasn't been able to make an appointment.   Patient can be reached at [phone]

## 2024-05-27 NOTE — Telephone Encounter (Signed)
 NA. E2c2s agent will need to schedule an apt for pt to seen.

## 2024-05-29 ENCOUNTER — Other Ambulatory Visit: Payer: Self-pay | Admitting: Family Medicine

## 2024-05-29 DIAGNOSIS — E109 Type 1 diabetes mellitus without complications: Secondary | ICD-10-CM

## 2024-05-29 NOTE — Telephone Encounter (Signed)
 ACCUCHEK METER not on current list.

## 2024-05-29 NOTE — Telephone Encounter (Signed)
 Copied from CRM #[id number]. Topic: Clinical - Medication Refill >> May 29, 2024 11:18 AM Nathanel BROCKS wrote: Medication: ACCUCHEK METER AND STRIPS HER INS WILL COVER THIS Has the patient contacted their pharmacy? Yes   This is the patient's preferred pharmacy:  [facility], [place] - [address] [place] [address] Phone: [phone] Fax: [phone]   Is this the correct pharmacy for this prescription? Yes If no, delete pharmacy and type the correct one.   Has the prescription been filled recently? No  Is the patient out of the medication? Yes  Has the patient been seen for an appointment in the last year OR does the patient have an upcoming appointment? Yes  Can we respond through MyChart? Yes  Agent: Please be advised that Rx refills may take up to 3 business days. We ask that you follow-up with your pharmacy.

## 2024-06-04 MED ORDER — ACCU-CHEK GUIDE ME W/DEVICE KIT: PACK | 0 refills | Status: AC

## 2024-06-04 NOTE — Telephone Encounter (Signed)
 Accu-Chek meter sent in, as the test strips were sent in on 05/29/24

## 2024-06-06 ENCOUNTER — Other Ambulatory Visit: Payer: Self-pay | Admitting: Family Medicine

## 2024-06-06 DIAGNOSIS — E108 Type 1 diabetes mellitus with unspecified complications: Secondary | ICD-10-CM

## 2024-06-10 ENCOUNTER — Telehealth: Admitting: Obstetrics & Gynecology

## 2024-07-01 ENCOUNTER — Telehealth

## 2024-07-06 ENCOUNTER — Encounter: Payer: Self-pay | Admitting: Radiology

## 2024-08-04 ENCOUNTER — Ambulatory Visit: Payer: Self-pay

## 2024-08-04 NOTE — Telephone Encounter (Signed)
 Tried calling patient at phone # on file [phone]) but no answer and then recording of this call cannot be completed as dialed started to play. If patient calls back, she needs an appt. Also need to make patient aware that her medical insurance may not pay for visit if seen at pcp office because most medical insurance plans look at dental pain as dental only and not medical. Just have to make patient aware in case insurance does not pay.

## 2024-08-04 NOTE — Telephone Encounter (Signed)
 FYI Only or Action Required?: Action required by provider: recommended patient for an appointment but patient refused due to no transportation. Patient is wanting pain medication-requesting a call back from the office.  Patient was last seen in primary care on 05/27/2024 by Vivienne Delon HERO, PA-C.  Called Nurse Triage reporting Dental Pain.  Symptoms began several months ago.  Interventions attempted: Rest, hydration, or home remedies.  Symptoms are: unchanged.  Triage Disposition: See HCP Within 4 Hours (Or PCP Triage)  Patient/caregiver understands and will follow disposition?:   Copied from CRM #[id number]. Topic: Clinical - Red Word Triage >> Aug 04, 2024 11:18 AM Joesph NOVAK wrote: Red Word that prompted transfer to Nurse Triage:  Severe pain with her teeth, she can not eat.. Experiencing weight loss. Requesting a 5 day supply for pain. Reason for Disposition  [1] SEVERE pain (e.g., excruciating, unable to eat, unable to do any normal activities) AND [2] not improved 2 hours after pain medicine  Answer Assessment - Initial Assessment Questions Patient with a hx of type I DM with teeth pain. Patient reports pain to the right and left lower teeth. Patient states her teeth in general are in bad shape. Was initially seen by a dentist in early 2025 but hasn't been able to be evaluated again. Discussed with patient that she would need to be evaluated in the office as she is requesting pain medication. Patient states she is unable to get to the office due to not having transportation. Patient is asking for a call back from the office.   1. LOCATION: Which tooth is hurting?  (e.g., right-side/left-side, upper/lower, front/back)     Patient reports she is having pain to both the right lower and left lower teeth. Patient endorses terrible status of her teeth due to diabetes. 2. ONSET: When did the toothache start?  (e.g., hours, days)      Started early Jan 2025 3. SEVERITY: How bad is the  toothache?  (Scale 1-10; mild, moderate or severe)     9 out of 10 4. SWELLING: Is there any visible swelling of your face?     Yes-patient states swelling has been occurring in the lower part of cheek and jaw line.  5. OTHER SYMPTOMS: Do you have any other symptoms? (e.g., fever)     No fever currently, nausea 6. PREGNANCY: Is there any chance you are pregnant? When was your last menstrual period?     no  Protocols used: Toothache-A-AH

## 2024-08-07 ENCOUNTER — Encounter: Payer: Self-pay | Admitting: Obstetrics & Gynecology

## 2024-08-19 ENCOUNTER — Telehealth

## 2024-08-19 DIAGNOSIS — K047 Periapical abscess without sinus: Secondary | ICD-10-CM | POA: Diagnosis not present

## 2024-08-19 DIAGNOSIS — B379 Candidiasis, unspecified: Secondary | ICD-10-CM

## 2024-08-19 DIAGNOSIS — T3695XA Adverse effect of unspecified systemic antibiotic, initial encounter: Secondary | ICD-10-CM | POA: Diagnosis not present

## 2024-08-19 MED ORDER — AMOXICILLIN-POT CLAVULANATE 875-125 MG PO TABS: 1.0000 | ORAL_TABLET | Freq: Two times a day (BID) | ORAL | 0 refills | Status: DC

## 2024-08-19 MED ORDER — FLUCONAZOLE 150 MG PO TABS: 150.0000 mg | ORAL_TABLET | ORAL | 0 refills | Status: DC | PRN

## 2024-08-19 NOTE — Patient Instructions (Signed)
 Ayzia  [name], thank you for joining Delon CHRISTELLA Dickinson, PA-C for today's virtual visit.  While this provider is not your primary care provider (PCP), if your PCP is located in our provider database this encounter information will be shared with them immediately following your visit.   A [facility] MyChart account gives you access to today's visit and all your visits, tests, and labs performed at [facility]  click here if you don't have a [facility] MyChart account or go to [email or web address]  Consent: (Patient) Rivers  L Benninger provided verbal consent for this virtual visit at the beginning of the encounter.  Current Medications:  Current Outpatient Medications:    amoxicillin -clavulanate (AUGMENTIN ) 875-125 MG tablet, Take 1 tablet by mouth 2 (two) times daily., Disp: 14 tablet, Rfl: 0   fluconazole  (DIFLUCAN ) 150 MG tablet, Take 1 tablet (150 mg total) by mouth every 3 (three) days as needed., Disp: 3 tablet, Rfl: 0   albuterol  (VENTOLIN  HFA) 108 (90 Base) MCG/ACT inhaler, USE 2 PUFFS EVERY 6 HOURS AS NEEDED, Disp: 8.5 g, Rfl: 1   Blood Glucose Monitoring Suppl (ACCU-CHEK GUIDE ME) w/Device KIT, Check BS QID Dx E10.8, Disp: 1 kit, Rfl: 0   glucose blood (ACCU-CHEK GUIDE TEST) test strip, Check BS QID Dx E10.8, Disp: 400 each, Rfl: 3   HUMULIN R  100 UNIT/ML injection, INJECT 0.01ML (1 UNIT) INTO THE SKIN 2 TIMES DAILY BEFORE A MEAL <150(0UNITS); 150-199 (1U); 200-249(2U);250-299(3U); 300-349(4U); >350(5U), Disp: 10 mL, Rfl: 3   levonorgestrel -ethinyl estradiol  (SEASONALE) 0.15-0.03 MG tablet, Take 1 tablet by mouth daily., Disp: 91 tablet, Rfl: 4   TRUEPLUS INSULIN  SYRINGE 31G X 5/16 0.5 ML MISC, USE AS DIRECTED, Disp: 200 each, Rfl: 3   Medications ordered in this encounter:  Meds ordered this encounter  Medications   amoxicillin -clavulanate (AUGMENTIN ) 875-125 MG tablet    Sig: Take 1 tablet by mouth 2 (two) times daily.    Dispense:  14 tablet    Refill:  0     Supervising Provider:   LAMPTEY, PHILIP O [[id number]]   fluconazole  (DIFLUCAN ) 150 MG tablet    Sig: Take 1 tablet (150 mg total) by mouth every 3 (three) days as needed.    Dispense:  3 tablet    Refill:  0    Supervising Provider:   LAMPTEY, PHILIP O [[id number]]     *If you need refills on other medications prior to your next appointment, please contact your pharmacy*  Follow-Up: Call back or seek an in-person evaluation if the symptoms worsen or if the condition fails to improve as anticipated.  [facility] [phone]  Other Instructions Dental Abscess  A dental abscess is an infection around a tooth that may involve pain, swelling, and a collection of pus, as well as other symptoms. Treatment is important to help with symptoms and to prevent the infection from spreading. The general types of dental abscesses are: Pulpal abscess. This abscess may form from the inner part of the tooth (pulp). Periodontal abscess. This abscess may form from the gum. What are the causes? This condition is caused by a bacterial infection in or around the tooth. It may result from: Severe tooth decay (cavities). Trauma to the tooth, such as a broken or chipped tooth. What increases the risk? This condition is more likely to develop in males. It is also more likely to develop in people who: Have cavities. Have severe gum disease. Eat sugary snacks between meals. Use tobacco products. Have diabetes. Have  a weakened disease-fighting system (immune system). Do not brush and care for their teeth regularly. What are the signs or symptoms? Mild symptoms of this condition include: Tenderness. Bad breath. Fever. A bitter taste in the mouth. Pain in and around the infected tooth. Moderate symptoms of this condition include: Swollen neck glands. Chills. Pus drainage. Swelling and redness around the infected tooth, in the mouth, or in the face. Severe pain in and around the infected  tooth. Severe symptoms of this condition include: Difficulty swallowing. Difficulty opening the mouth. Nausea. Vomiting. How is this diagnosed? This condition is diagnosed based on: Your symptoms and your medical and dental history. An examination of the infected tooth. During the exam, your dental care provider may tap on the infected tooth. You may also need to have X-rays taken of the affected area. How is this treated? This condition is treated by getting rid of the infection. This may be done with: Antibiotic medicines. These may be used in certain situations. Antibacterial mouth rinse. Incision and drainage. This procedure is done by making an incision in the abscess to drain out the pus. Removing pus is the first priority in treating an abscess. A root canal. This may be performed to save the tooth. Your dental care provider accesses the visible part of your tooth (crown) with a drill and removes any infected pulp. Then the space is filled and sealed off. Tooth extraction. The tooth is pulled out if it cannot be saved by other treatment. You may also receive treatment for pain, such as: Acetaminophen  or NSAIDs. Gels that contain a numbing medicine. An injection to block the pain near your nerve. Follow these instructions at home: Medicines Take over-the-counter and prescription medicines only as told by your dental care provider. If you were prescribed an antibiotic, take it as told by your dental care provider. Do not stop taking the antibiotic even if you start to feel better. If you were prescribed a gel that contains a numbing medicine, use it exactly as told in the directions. Do not use these gels for children who are younger than 21 years of age. Use an antibacterial mouth rinse as told by your dental care provider. General instructions  Gargle with a mixture of salt and water  3-4 times a day or as needed. To make salt water , completely dissolve -1 tsp (3-6 g) of salt in 1  cup (237 mL) of warm water . Eat a soft diet while your abscess is healing. Drink enough fluid to keep your urine pale yellow. Do not apply heat to the outside of your mouth. Do not use any products that contain nicotine or tobacco. These products include cigarettes, chewing tobacco, and vaping devices, such as e-cigarettes. If you need help quitting, ask your dental care provider. Keep all follow-up visits. This is important. How is this prevented?  Excellent dental home care, which includes brushing your teeth every morning and night with fluoride toothpaste. Floss one time each day. Get regularly scheduled dental cleanings. Consider having a dental sealant applied on teeth that have deep grooves to prevent cavities. Drink fluoridated water  regularly. This includes most tap water . Check the label on bottled water  to see if it contains fluoride. Reduce or eliminate sugary drinks. Eat healthy meals and snacks. Wear a mouth guard or face shield to protect your teeth while playing sports. Contact a health care provider if: Your pain is worse and is not helped by medicine. You have swelling. You see pus around the tooth. You have  a fever or chills. Get help right away if: Your symptoms suddenly get worse. You have a very bad headache. You have problems breathing or swallowing. You have trouble opening your mouth. You have swelling in your neck or around your eye. These symptoms may represent a serious problem that is an emergency. Do not wait to see if the symptoms will go away. Get medical help right away. Call your local emergency services (911 in the [place] Do not drive yourself to the hospital. Summary A dental abscess is a collection of pus in or around a tooth that results from an infection. A dental abscess may result from severe tooth decay, trauma to the tooth, or severe gum disease around a tooth. Symptoms include severe pain, swelling, redness, and drainage of pus in and around  the infected tooth. The first priority in treating a dental abscess is to drain out the pus. Treatment may also involve removing damage inside the tooth (root canal) or extracting the tooth. This information is not intended to replace advice given to you by your health care provider. Make sure you discuss any questions you have with your health care provider. Document Revised: 10/27/2020 Document Reviewed: 10/27/2020 Elsevier Patient Education  2024 Elsevier Inc.   If you have been instructed to have an in-person evaluation today at a local Urgent Care facility, please use the link below. It will take you to a list of all of our available [facility], including address, phone number and hours of operation. Please do not delay care.  [facility]  If you or a family member do not have a primary care provider, use the link below to schedule a visit and establish care. When you choose a [facility] primary care physician or advanced practice provider, you gain a long-term partner in health. Find a Primary Care Provider  Learn more about [facility] in-office and virtual care options: [facility] - Get Care Now

## 2024-08-19 NOTE — Progress Notes (Signed)
 Virtual Visit Consent   Brittany Wade  L Service, you are scheduled for a virtual visit with a [facility] provider today. Just as with appointments in the office, your consent must be obtained to participate. Your consent will be active for this visit and any virtual visit you may have with one of our providers in the next 365 days. If you have a MyChart account, a copy of this consent can be sent to you electronically.  As this is a virtual visit, video technology does not allow for your provider to perform a traditional examination. This may limit your provider's ability to fully assess your condition. If your provider identifies any concerns that need to be evaluated in person or the need to arrange testing (such as labs, EKG, etc.), we will make arrangements to do so. Although advances in technology are sophisticated, we cannot ensure that it will always work on either your end or our end. If the connection with a video visit is poor, the visit may have to be switched to a telephone visit. With either a video or telephone visit, we are not always able to ensure that we have a secure connection.  By engaging in this virtual visit, you consent to the provision of healthcare and authorize for your insurance to be billed (if applicable) for the services provided during this visit. Depending on your insurance coverage, you may receive a charge related to this service.  I need to obtain your verbal consent now. Are you willing to proceed with your visit today? Chyenne  L Golla has provided verbal consent on 08/19/2024 for a virtual visit (video or telephone). Delon CHRISTELLA Dickinson, PA-C  Date: 08/19/2024 10:42 AM   Virtual Visit via Video Note   I, Delon CHRISTELLA Dickinson, connected with  Anelia  ARMENTA ERSKIN  ([id number], 22-Jan-2001) on 08/19/2024 at 10:30 AM EST by a video-enabled telemedicine application and verified that I am speaking with the correct person using two identifiers.  Location: Patient: Virtual Visit  Location Patient: Home Provider: Virtual Visit Location Provider: Home Office   I discussed the limitations of evaluation and management by telemedicine and the availability of in person appointments. The patient expressed understanding and agreed to proceed.    History of Present Illness: Brittany Wade  L Mooradian is a 23 y.o. who identifies as a female who was assigned female at birth, and is being seen today for dental pain.  HPI: Dental Pain  This is a new problem. The current episode started 1 to 4 weeks ago (1 week). The problem has been unchanged. Associated symptoms include facial pain, oral bleeding (mild with brushing) and thermal sensitivity (cold). Pertinent negatives include no difficulty swallowing, fever or sinus pressure. Associated symptoms comments: Swelling of gums. She has tried acetaminophen  and NSAIDs (listerene) for the symptoms. The treatment provided no relief.    Problems:  Patient Active Problem List   Diagnosis Date Noted   Intermittent asthma without complication 05/12/2024   Rib pain on left side 04/15/2024   Type 1 diabetes mellitus without complications (HCC) 02/28/2022   Positive GAD antibody 02/28/2022   History of primary cesarean section 08/07/2021   Hyperlipidemia due to type 1 diabetes mellitus (HCC) 10/04/2020   Hidradenitis suppurativa 09/30/2018   Gastroesophageal reflux disease without esophagitis 05/26/2018   Anxiety state 04/04/2015   Panic attack 04/04/2015    Allergies: Allergies[1] Medications: Current Medications[2]  Observations/Objective: Patient is well-developed, well-nourished in no acute distress.  Resting comfortably at home.  Head is normocephalic, atraumatic.  No labored breathing.  Speech is clear and coherent with logical content.  Patient is alert and oriented at baseline.    Assessment and Plan: 1. Dental infection (Primary) - amoxicillin -clavulanate (AUGMENTIN ) 875-125 MG tablet; Take 1 tablet by mouth 2 (two) times daily.   Dispense: 14 tablet; Refill: 0  2. Antibiotic-induced yeast infection - fluconazole  (DIFLUCAN ) 150 MG tablet; Take 1 tablet (150 mg total) by mouth every 3 (three) days as needed.  Dispense: 3 tablet; Refill: 0  - Suspected infection  - Augmentin  prescribed - Can use ice on outside jaw/cheek for swelling - Can also take tylenol  for pain with other medications - Discussed DenTemp putty that can be used to cover a broken tooth Diflucan  given as prophylaxis as patient tends to get vaginal yeast infections with antibiotic use - Schedule a follow with a dentist as soon as possible (Can contact [facility] if underinsured or uninsured) - Seek in person evaluation if symptoms fail to improve or if they worsen   Follow Up Instructions: I discussed the assessment and treatment plan with the patient. The patient was provided an opportunity to ask questions and all were answered. The patient agreed with the plan and demonstrated an understanding of the instructions.  A copy of instructions were sent to the patient via MyChart unless otherwise noted below.    The patient was advised to call back or seek an in-person evaluation if the symptoms worsen or if the condition fails to improve as anticipated.    Delon HERO Rajan Burgard, PA-C     [1] No Active Allergies [2]  Current Outpatient Medications:    amoxicillin -clavulanate (AUGMENTIN ) 875-125 MG tablet, Take 1 tablet by mouth 2 (two) times daily., Disp: 14 tablet, Rfl: 0   fluconazole  (DIFLUCAN ) 150 MG tablet, Take 1 tablet (150 mg total) by mouth every 3 (three) days as needed., Disp: 3 tablet, Rfl: 0   albuterol  (VENTOLIN  HFA) 108 (90 Base) MCG/ACT inhaler, USE 2 PUFFS EVERY 6 HOURS AS NEEDED, Disp: 8.5 g, Rfl: 1   Blood Glucose Monitoring Suppl (ACCU-CHEK GUIDE ME) w/Device KIT, Check BS QID Dx E10.8, Disp: 1 kit, Rfl: 0   glucose blood (ACCU-CHEK GUIDE TEST) test strip, Check BS QID Dx E10.8, Disp: 400 each, Rfl: 3   HUMULIN R   100 UNIT/ML injection, INJECT 0.01ML (1 UNIT) INTO THE SKIN 2 TIMES DAILY BEFORE A MEAL <150(0UNITS); 150-199 (1U); 200-249(2U);250-299(3U); 300-349(4U); >350(5U), Disp: 10 mL, Rfl: 3   levonorgestrel -ethinyl estradiol  (SEASONALE) 0.15-0.03 MG tablet, Take 1 tablet by mouth daily., Disp: 91 tablet, Rfl: 4   TRUEPLUS INSULIN  SYRINGE 31G X 5/16 0.5 ML MISC, USE AS DIRECTED, Disp: 200 each, Rfl: 3

## 2024-08-19 NOTE — Patient Instructions (Incomplete)

## 2024-08-24 ENCOUNTER — Ambulatory Visit: Admitting: Nurse Practitioner

## 2024-08-24 NOTE — Progress Notes (Deleted)
 "                                                                        Endocrinology Consult Note       08/24/2024, 7:27 AM   Subjective:    Patient ID: Brittany  LITTIE Wade, female    DOB: [birth year].  Brittany  L Wade is being seen in consultation for management of currently uncontrolled symptomatic diabetes requested by  Severa Rock HERO, FNP.   Past Medical History:  Diagnosis Date   Circadian rhythm disorder 04/04/2015   Constipation    Diabetes mellitus without complication (HCC)    Migraine 05/21/2018   Tremor 04/04/2015    Past Surgical History:  Procedure Laterality Date   ADENOIDECTOMY     CESAREAN SECTION N/A 08/07/2021   Procedure: CESAREAN SECTION;  Surgeon: Cleatus Moccasin, MD;  Location: [facility];  Service: Obstetrics;  Laterality: N/A;   MOUTH SURGERY     TONSILLECTOMY      Social History   Socioeconomic History   Marital status: Significant Other    Spouse name: Not on file   Number of children: Not on file   Years of education: Not on file   Highest education level: Not on file  Occupational History   Not on file  Tobacco Use   Smoking status: Former    Current packs/day: 0.50    Average packs/day: 0.5 packs/day for 4.0 years (2.0 ttl pk-yrs)    Types: Cigarettes   Smokeless tobacco: Never  Vaping Use   Vaping status: Every Day  Substance and Sexual Activity   Alcohol use: No   Drug use: No   Sexual activity: Yes    Birth control/protection: None, Condom  Other Topics Concern   Not on file  Social History Narrative   Not on file   Social Drivers of Health   Tobacco Use: Medium Risk (08/19/2024)   Patient History    Smoking Tobacco Use: Former    Smokeless Tobacco Use: Never    Passive Exposure: Not on file  Financial Resource Strain: Low Risk (07/10/2022)   Overall Financial Resource Strain (CARDIA)    Difficulty of Paying Living Expenses: Not very hard  Food Insecurity: Food Insecurity Present (07/10/2022)   Hunger Vital Sign    Worried About  Running Out of Food in the Last Year: Never true    Ran Out of Food in the Last Year: Sometimes true  Transportation Needs: No Transportation Needs (07/10/2022)   PRAPARE - Administrator, Civil Service (Medical): No    Lack of Transportation (Non-Medical): No  Physical Activity: Sufficiently Active (07/10/2022)   Exercise Vital Sign    Days of Exercise per Week: 3 days    Minutes of Exercise per Session: 60 min  Stress: No Stress Concern Present (07/10/2022)   [employer] of Occupational Health - Occupational Stress Questionnaire    Feeling of Stress : Only a little  Social Connections: Moderately Isolated (07/10/2022)   Social Connection and Isolation Panel    Frequency of Communication with Friends and Family: Three times a week    Frequency of Social Gatherings with Friends and Family: Three times a week    Attends Religious Services: Never  Active Member of Clubs or Organizations: No    Attends Banker Meetings: Never    Marital Status: Living with partner  Depression (PHQ2-9): Low Risk (05/12/2024)   Depression (PHQ2-9)    PHQ-2 Score: 2  Alcohol Screen: Low Risk (07/10/2022)   Alcohol Screen    Last Alcohol Screening Score (AUDIT): 2  Housing: Low Risk (07/10/2022)   Housing    Last Housing Risk Score: 0  Utilities: Not At Risk (07/10/2022)   AHC Utilities    Threatened with loss of utilities: No  Health Literacy: Not on file    Family History  Problem Relation Age of Onset   Migraines Mother    Mental illness Mother    Bipolar disorder Mother    Depression Mother    Anxiety disorder Mother    Seizures Mother    Heart disease Mother    Diabetes Mother    Asperger's syndrome Sister    ADD / ADHD Sister    Migraines Maternal Grandmother    Depression Maternal Grandmother    Anxiety disorder Maternal Grandmother    Epilepsy Other        maternal aunt   Diabetes Other    Heart disease Other    ADD / ADHD Cousin     Outpatient  Encounter Medications as of 08/24/2024  Medication Sig   albuterol  (VENTOLIN  HFA) 108 (90 Base) MCG/ACT inhaler USE 2 PUFFS EVERY 6 HOURS AS NEEDED   amoxicillin -clavulanate (AUGMENTIN ) 875-125 MG tablet Take 1 tablet by mouth 2 (two) times daily.   Blood Glucose Monitoring Suppl (ACCU-CHEK GUIDE ME) w/Device KIT Check BS QID Dx E10.8   fluconazole  (DIFLUCAN ) 150 MG tablet Take 1 tablet (150 mg total) by mouth every 3 (three) days as needed.   glucose blood (ACCU-CHEK GUIDE TEST) test strip Check BS QID Dx E10.8   HUMULIN R  100 UNIT/ML injection INJECT 0.01ML (1 UNIT) INTO THE SKIN 2 TIMES DAILY BEFORE A MEAL <150(0UNITS); 150-199 (1U); 200-249(2U);250-299(3U); 300-349(4U); >350(5U)   levonorgestrel -ethinyl estradiol  (SEASONALE) 0.15-0.03 MG tablet Take 1 tablet by mouth daily.   TRUEPLUS INSULIN  SYRINGE 31G X 5/16 0.5 ML MISC USE AS DIRECTED   [DISCONTINUED] fluconazole  (DIFLUCAN ) 150 MG tablet Take 1 tablet (150 mg total) by mouth every 3 (three) days as needed.   No facility-administered encounter medications on file as of 08/24/2024.    ALLERGIES: Allergies[1]  VACCINATION STATUS: Immunization History  Administered Date(s) Administered   DTaP 10/10/1999, 12/05/1999, 02/13/2000, 11/25/2000   HIB (PRP-OMP) 10/10/1999, 12/05/1999, 02/13/2000, 09/04/2000   HPV Quadrivalent 03/16/2015   Hep B, Unspecified 1999[birth year]07[birth year]04, 10/10/1999, 02/13/2000   Hepatitis B 1999-03[birth year]01, 10/10/1999, 02/13/2000   Hpv-Unspecified 03/16/2015   IPV 10/10/1999, 12/05/1999, 09/04/2000   Influenza Nasal 06/28/2008   Influenza,inj,Quad PF,6+ Mos 10/21/2017, 07/14/2018   Influenza-Unspecified 06/28/2008, 07/14/2018   MMR 09/04/2000   Pneumococcal Conjugate PCV 7 11/25/2000   Pneumococcal Conjugate-13 11/25/2000   Tdap 10/04/2020, 06/19/2021   Varicella 11/25/2000    Diabetes She presents for her initial diabetic visit. She has type 1 diabetes mellitus. Onset time: diagnosed at age 23. Her disease course has  been fluctuating. Hypoglycemia symptoms include nervousness/anxiousness, sweats and tremors. There are no hypoglycemic complications. There are no diabetic complications. Risk factors for coronary artery disease include diabetes mellitus and dyslipidemia. Current diabetic treatment includes insulin  injections. She is compliant with treatment some of the time. Her weight is fluctuating minimally. She is following a generally unhealthy diet. When asked about meal planning, she reported none. She has had  a previous visit with a dietitian (in distant past). She participates in exercise intermittently. An ACE inhibitor/angiotensin II receptor blocker is not being taken. She does not see a podiatrist.Eye exam is current.     Review of systems  Constitutional: + Minimally fluctuating body weight, current There is no height or weight on file to calculate BMI., no fatigue, no subjective hyperthermia, no subjective hypothermia Eyes: no blurry vision, no xerophthalmia ENT: no sore throat, no nodules palpated in throat, no dysphagia/odynophagia, no hoarseness Cardiovascular: no chest pain, no shortness of breath, no palpitations, no leg swelling Respiratory: no cough, no shortness of breath Gastrointestinal: no nausea/vomiting/diarrhea Musculoskeletal: no muscle/joint aches Skin: no rashes, no hyperemia Neurological: no tremors, no numbness, no tingling, no dizziness Psychiatric: no depression, no anxiety  Objective:     There were no vitals taken for this visit.  Wt Readings from Last 3 Encounters:  05/13/24 160 lb 3.2 oz (72.7 kg)  05/12/24 160 lb 3.2 oz (72.7 kg)  04/15/24 147 lb 6.4 oz (66.9 kg)     BP Readings from Last 3 Encounters:  05/13/24 114/67  05/12/24 117/60  04/15/24 96/60     Physical Exam- Limited  Constitutional:  There is no height or weight on file to calculate BMI. , not in acute distress, normal state of mind Eyes:  EOMI, no exophthalmos Neck: Supple Cardiovascular:  RRR, no murmurs, rubs, or gallops, no edema Respiratory: Adequate breathing efforts, no crackles, rales, rhonchi, or wheezing Musculoskeletal: no gross deformities, strength intact in all four extremities, no gross restriction of joint movements Skin:  no rashes, no hyperemia Neurological: no tremor with outstretched hands   Diabetic Foot Exam - Simple   No data filed      CMP ( most recent) CMP     Component Value Date/Time   NA 138 05/13/2024 1538   NA 137 05/12/2024 1302   K 3.6 05/13/2024 1538   CL 102 05/13/2024 1538   CO2 25 05/13/2024 1538   GLUCOSE 145 (H) 05/13/2024 1538   BUN 11 05/13/2024 1538   BUN 10 05/12/2024 1302   CREATININE 0.54 05/13/2024 1538   CALCIUM  8.9 05/13/2024 1538   PROT 6.6 05/12/2024 1302   ALBUMIN 4.3 05/12/2024 1302   AST 15 05/12/2024 1302   ALT 10 05/12/2024 1302   ALKPHOS 72 05/12/2024 1302   BILITOT <0.2 05/12/2024 1302   EGFR 131 05/12/2024 1302   GFRNONAA >60 05/13/2024 1538     Diabetic Labs (most recent): Lab Results  Component Value Date   HGBA1C 10.9 (H) 05/12/2024   HGBA1C 7.7 (H) 02/28/2022   HGBA1C 6.3 (H) 02/07/2021     Lipid Panel ( most recent) Lipid Panel     Component Value Date/Time   CHOL 185 05/12/2024 1302   TRIG 216 (H) 05/12/2024 1302   HDL 46 05/12/2024 1302   CHOLHDL 4.0 05/12/2024 1302   LDLCALC 102 (H) 05/12/2024 1302   LABVLDL 37 05/12/2024 1302      No results found for: TSH, FREET4         Assessment & Plan:   1) Type 1 diabetes mellitus with hyperglycemia (HCC) (Primary)    - Brittany Wade has currently uncontrolled symptomatic type 1 DM since 23 years of age, with most recent A1c of *** %.   -Recent labs reviewed.  - I had a long discussion with her about the progressive nature of diabetes and the pathology behind its complications. -her diabetes is complicated by noncompliance and she  remains at a high risk for more acute and chronic complications which include CAD, CVA,  CKD, retinopathy, and neuropathy. These are all discussed in detail with her.  The following Lifestyle Medicine recommendations according to American College of Lifestyle Medicine [facility]) were discussed and offered to patient and she agrees to start the journey:  A. Whole Foods, Plant-based plate comprising of fruits and vegetables, plant-based proteins, whole-grain carbohydrates was discussed in detail with the patient.   A list for source of those nutrients were also provided to the patient.  Patient will use only water  or unsweetened tea for hydration. B.  The need to stay away from risky substances including alcohol, smoking; obtaining 7 to 9 hours of restorative sleep, at least 150 minutes of moderate intensity exercise weekly, the importance of healthy social connections,  and stress reduction techniques were discussed. C.  A full color page of  Calorie density of various food groups per pound showing examples of each food groups was provided to the patient.  - I have counseled her on diet and weight management by adopting a carbohydrate restricted/protein rich diet. Patient is encouraged to switch to unprocessed or minimally processed complex starch and increased protein intake (animal or plant source), fruits, and vegetables. -  she is advised to stick to a routine mealtimes to eat 3 meals a day and avoid unnecessary snacks (to snack only to correct hypoglycemia).   - she acknowledges that there is a room for improvement in her food and drink choices. - Suggestion is made for her to avoid simple carbohydrates from her diet including Cakes, Sweet Desserts, Ice Cream, Soda (diet and regular), Sweet Tea, Candies, Chips, Cookies, Store Bought Juices, Alcohol in Excess of 1-2 drinks a day, Artificial Sweeteners, Coffee Creamer, and Sugar-free Products. This will help patient to have more stable blood glucose profile and potentially avoid unintended weight gain.  - she will be scheduled with Santana Duke, RDN, CDE for diabetes education.  - I have approached her with the following individualized plan to manage her diabetes and patient agrees:    -she is encouraged to start/continue monitoring glucose 4 times daily, before meals and before bed, to log their readings on the clinic sheets provided, and bring them to review at follow up appointment in 3 months.  - she is warned not to take insulin  without proper monitoring per orders. - Adjustment parameters are given to her for hypo and hyperglycemia in writing. - she is encouraged to call clinic for blood glucose levels less than 70 or above 300 mg /dl.  -She is not a candidate for non insulin  therapies given type 1 diagnosis.  - Specific targets for  A1c; LDL, HDL, and Triglycerides were discussed with the patient.  2) Blood Pressure /Hypertension:  her blood pressure is controlled to target without the use of antihypertensive medications.  She will be considered for ACE/ARB if BP elevated on 3 separate occasions.  3) Lipids/Hyperlipidemia:    Review of her recent lipid panel from 05/12/24 showed uncontrolled LDL at 102 and elevated triglycerides of 216.  She is not currently on any lipid lowering medications.    4)  Weight/Diet:  her There is no height or weight on file to calculate BMI.  -  *** clearly complicating her diabetes care.   she is *** a candidate for weight loss. I discussed with her the fact that loss of 5 - 10% of her  current body weight will have the most impact on her  diabetes management.  Exercise, and detailed carbohydrates information provided  -  detailed on discharge instructions.  5) Chronic Care/Health Maintenance: -she is not on ACEI/ARB or Statin medications and is encouraged to initiate and continue to follow up with Ophthalmology, Dentist, Podiatrist at least yearly or according to recommendations, and advised to *** stay away from smoking. I have recommended yearly flu vaccine and pneumonia vaccine at  least every 5 years; moderate intensity exercise for up to 150 minutes weekly; and sleep for at least 7 hours a day.  - she is advised to maintain close follow up with Rakes, Rock HERO, FNP for primary care needs, as well as her other providers for optimal and coordinated care.   - Time spent in this patient care: 60 min, which was spent in counseling her about her diabetes and the rest reviewing her blood glucose logs, discussing her hypoglycemia and hyperglycemia episodes, reviewing her current and previous labs/studies (including abstraction from other facilities) and medications doses and developing a long term treatment plan based on the latest standards of care/guidelines; and documenting her care.    Please refer to Patient Instructions for Blood Glucose Monitoring and Insulin /Medications Dosing Guide in media tab for additional information. Please also refer to Patient Self Inventory in the Media tab for reviewed elements of pertinent patient history.  Brittany Wade participated in the discussions, expressed understanding, and voiced agreement with the above plans.  All questions were answered to her satisfaction. she is encouraged to contact clinic should she have any questions or concerns prior to her return visit.     Follow up plan: - No follow-ups on file.    Benton Rio, [facility] [address] [place], [place] [address] Phone: [phone] Fax: [phone]  08/24/2024, 7:27 AM       [1] No Active Allergies  "

## 2024-08-25 ENCOUNTER — Telehealth: Payer: Self-pay | Admitting: Nurse Practitioner

## 2024-08-25 NOTE — Telephone Encounter (Signed)
 Noted.  I simply cannot dedicate the time needed for her new patient appointment today and it would be putting her at a disservice.

## 2024-08-25 NOTE — Telephone Encounter (Signed)
 Pt lvm on after hours line stating that she would be late for her 1 or 1:30 appointment appointment.  I called pt at 12:55 to let her know that she missed appointment on 08/24/24 and that I could reschedule for next available.  Pt then stated she could not wait for another appointment and mother was yelling in the background that they were coming, but was also still currently 20  minutes away, and that we WOULD work her in(the mom yelled that in the background).  Discussed with Benton and Benton stated that she would not have time today to work it in due to being a new pt and Type 1.  Tried to ask if pt still wanted me to reschedule for next available and put pt on wait list and pt was talking to mom in the background and then hung up before able to reschedule.  Pt was sent a letter in November with appointment date and time and left a voicemail ahead of her appointment with the date and time on 08/17/24.  Pt was also active on Mychart on 08/19/24.  Pt stated multiple times that her PCP had screwed up her referral multiple times which is why she felt she was having to wait so long.

## 2024-09-07 DIAGNOSIS — R600 Localized edema: Secondary | ICD-10-CM

## 2024-09-08 ENCOUNTER — Other Ambulatory Visit: Payer: Self-pay

## 2024-09-08 ENCOUNTER — Encounter: Payer: Self-pay | Admitting: Family Medicine

## 2024-09-08 ENCOUNTER — Telehealth: Payer: Self-pay

## 2024-09-08 ENCOUNTER — Emergency Department (HOSPITAL_COMMUNITY)
Admission: EM | Admit: 2024-09-08 | Discharge: 2024-09-08 | Discharge status: Against Medical Advice | Attending: Emergency Medicine | Admitting: Emergency Medicine

## 2024-09-08 ENCOUNTER — Encounter (HOSPITAL_COMMUNITY): Payer: Self-pay

## 2024-09-08 DIAGNOSIS — R1013 Epigastric pain: Secondary | ICD-10-CM | POA: Insufficient documentation

## 2024-09-08 DIAGNOSIS — R079 Chest pain, unspecified: Secondary | ICD-10-CM | POA: Diagnosis present

## 2024-09-08 DIAGNOSIS — Z5321 Procedure and treatment not carried out due to patient leaving prior to being seen by health care provider: Secondary | ICD-10-CM | POA: Insufficient documentation

## 2024-09-08 DIAGNOSIS — R110 Nausea: Secondary | ICD-10-CM | POA: Diagnosis not present

## 2024-09-08 NOTE — ED Triage Notes (Signed)
 Pt reports CP and endocrinologist sent for further evaluation of cp; started yesterday, accompanied by epigastric pain; hx DM; denies sob, endorses nausea; denies fevers  Pt gives verbal consent for mse

## 2024-09-08 NOTE — Telephone Encounter (Signed)
 Copied from CRM [id number]. Topic: Clinical - Medication Question >> Sep 08, 2024 10:13 AM Brittany Wade wrote: Reason for CRM: the patient has a medical question about a pain in her chest that went down to her stomach and she dropped to her knees she started sweating and lost her color and she had to sit the for an hour in the store yesterday at [employer] and she wanted to inform the provider that this happened yesterday and she declined EMS but she is being cautious now she is concerned this could be related to her heart  Pt num  [phone]

## 2024-09-08 NOTE — Telephone Encounter (Signed)
 Patient responded to on Mychart. LS

## 2024-09-09 ENCOUNTER — Encounter (HOSPITAL_COMMUNITY): Payer: Self-pay

## 2024-09-09 ENCOUNTER — Telehealth: Payer: Self-pay

## 2024-09-09 ENCOUNTER — Ambulatory Visit: Payer: Self-pay

## 2024-09-09 ENCOUNTER — Other Ambulatory Visit: Payer: Self-pay

## 2024-09-09 ENCOUNTER — Emergency Department (HOSPITAL_COMMUNITY)
Admission: EM | Admit: 2024-09-09 | Discharge: 2024-09-09 | Disposition: A | Discharge status: Home / Self Care | Attending: Emergency Medicine | Admitting: Emergency Medicine

## 2024-09-09 DIAGNOSIS — R079 Chest pain, unspecified: Secondary | ICD-10-CM | POA: Diagnosis present

## 2024-09-09 DIAGNOSIS — E109 Type 1 diabetes mellitus without complications: Secondary | ICD-10-CM | POA: Diagnosis not present

## 2024-09-09 DIAGNOSIS — Z794 Long term (current) use of insulin: Secondary | ICD-10-CM | POA: Diagnosis not present

## 2024-09-09 HISTORY — DX: Gastro-esophageal reflux disease without esophagitis: K21.9

## 2024-09-09 HISTORY — DX: Depression, unspecified: F32.A

## 2024-09-09 HISTORY — DX: Anxiety disorder, unspecified: F41.9

## 2024-09-09 LAB — COMPREHENSIVE METABOLIC PANEL WITH GFR
ALT: <5 U/L (ref 0–44)
AST: 14 U/L — ABNORMAL LOW (ref 15–41)
Albumin: 4.7 g/dL (ref 3.5–5.0)
Alkaline Phosphatase: 70 U/L (ref 38–126)
Anion gap: 12 (ref 5–15)
BUN: 6 mg/dL (ref 6–20)
CO2: 25 mmol/L (ref 22–32)
Calcium: 9.4 mg/dL (ref 8.9–10.3)
Chloride: 101 mmol/L (ref 98–111)
Creatinine, Ser: 0.64 mg/dL (ref 0.44–1.00)
GFR, Estimated: >60 mL/min
Glucose, Bld: 213 mg/dL — ABNORMAL HIGH (ref 70–99)
Potassium: 4 mmol/L (ref 3.5–5.1)
Sodium: 137 mmol/L (ref 135–145)
Total Bilirubin: 0.2 mg/dL (ref 0.0–1.2)
Total Protein: 8.1 g/dL (ref 6.5–8.1)

## 2024-09-09 LAB — URINALYSIS, ROUTINE W REFLEX MICROSCOPIC
Bilirubin Urine: NEGATIVE
Glucose, UA: >=500 mg/dL — AB
Hgb urine dipstick: NEGATIVE
Ketones, ur: NEGATIVE mg/dL
Nitrite: NEGATIVE
Protein, ur: 100 mg/dL — AB
Specific Gravity, Urine: 1.028 (ref 1.005–1.030)
WBC, UA: >50 WBC/hpf (ref 0–5)
pH: 5 (ref 5.0–8.0)

## 2024-09-09 LAB — TROPONIN T, HIGH SENSITIVITY: Troponin T High Sensitivity: <15 ng/L (ref 0–19)

## 2024-09-09 LAB — CBC
HCT: 34.2 % — ABNORMAL LOW (ref 36.0–46.0)
Hemoglobin: 9.6 g/dL — ABNORMAL LOW (ref 12.0–15.0)
MCH: 20.1 pg — ABNORMAL LOW (ref 26.0–34.0)
MCHC: 28.1 g/dL — ABNORMAL LOW (ref 30.0–36.0)
MCV: 71.5 fL — ABNORMAL LOW (ref 80.0–100.0)
Platelets: 468 K/uL — ABNORMAL HIGH (ref 150–400)
RBC: 4.78 MIL/uL (ref 3.87–5.11)
RDW: 17.4 % — ABNORMAL HIGH (ref 11.5–15.5)
WBC: 14.5 K/uL — ABNORMAL HIGH (ref 4.0–10.5)
nRBC: 0 % (ref 0.0–0.2)

## 2024-09-09 LAB — PREGNANCY, URINE: Preg Test, Ur: NEGATIVE

## 2024-09-09 LAB — LIPASE, BLOOD: Lipase: 16 U/L (ref 11–51)

## 2024-09-09 NOTE — Telephone Encounter (Signed)
 Copied from CRM #[id number]. Topic: Clinical - Request for Lab/Test Order >> Sep 09, 2024  1:37 PM Brittany Wade wrote: Reason for CRM: Pt called requesting for assistance to be seen at [facility] ED. Wants to see a cardiologist there, following recommendation from triage earlier today. Please advise  Best contact: [phone]

## 2024-09-09 NOTE — Telephone Encounter (Signed)
Patient currently at [place] ED.   

## 2024-09-09 NOTE — Telephone Encounter (Signed)
"  Duplicate message.   "

## 2024-09-09 NOTE — Telephone Encounter (Signed)
ED advised

## 2024-09-09 NOTE — Telephone Encounter (Signed)
 FYI Only or Action Required?: FYI only for provider: ED advised.  Patient was last seen in primary care on 08/19/2024 by Vivienne Delon HERO, PA-C.  Called Nurse Triage reporting Chest Pain.  Symptoms began several days ago.  Interventions attempted: Nothing.  Symptoms are: gradually improving.  Triage Disposition: See HCP Within 4 Hours (Or PCP Triage)  Patient/caregiver understands and will follow disposition?: Yes  Copied from CRM #[id number]. Topic: Clinical - Red Word Triage >> Sep 09, 2024  1:39 PM Brittany Wade wrote: Red Word that prompted transfer to Nurse Triage: Upper abdominal pain, chest tightness. Wants to go to ED at [facility]. Reason for Disposition  [1] MILD-MODERATE pain AND [2] constant AND [3] present > 2 hours  Answer Assessment - Initial Assessment Questions 1. LOCATION: Where does it hurt?      Epigastric pain  2. RADIATION: Does the pain shoot anywhere else? (e.g., chest, back)     Denies  3. ONSET: When did the pain begin? (e.g., minutes, hours or days ago)      2 days  4. SUDDEN: Gradual or sudden onset?   Gradual  5. PATTERN Does the pain come and go, or is it constant?     Constant  6. SEVERITY: How bad is the pain?  (e.g., Scale 1-10; mild, moderate, or severe)     Crampy like when she was on pitocin  in labor; 6/10  7. RECURRENT SYMPTOM: Have you ever had this type of stomach pain before? If Yes, ask: When was the last time? and What happened that time?      Denies  8. CAUSE: What do you think is causing the stomach pain? (e.g., gallstones, recent abdominal surgery)     Pt reports she believes she had a heart attack at [facility] 2 days ago, was seen at the ED and eloped yesterday  9. RELIEVING/AGGRAVATING FACTORS: What makes it better or worse? (e.g., antacids, bending or twisting motion, bowel movement)     Anxiety worsens it  10. OTHER SYMPTOMS: Do you have any other symptoms? (e.g., back pain, diarrhea, fever,  urination pain, vomiting)       Anxiety, recent anxiety attack at [facility], has had chest pressure the last few weeks, none at this time  11. PREGNANCY: Is there any chance you are pregnant? When was your last menstrual period?       No; LMP December 2025, on birth control  Protocols used: Abdominal Pain - [facility]

## 2024-09-09 NOTE — ED Provider Notes (Signed)
 " [place] EMERGENCY DEPARTMENT AT [facility] Provider Note   CSN: [id number] Arrival date & time: 09/09/24  1612     Patient presents with: Abdominal Pain   Brittany Wade is a 24 y.o. female.   Patient reports that she has been having some discomfort in her chest for the past 2 days.  Patient reports that the pain starts in her left mid abdomen and goes up into her chest.  Patient reports she has taken Tylenol  ibuprofen  and Gas-X without relief.  Patient states that she is concerned that she could be having a heart attack.  Patient states she talked to her primary care doctor who advised her to come to the emergency department for evaluation.  Patient denies any fever or chills she has not had any cough or congestion.  Patient complains of soreness in her upper abdomen.  The history is provided by the patient. No language interpreter was used.  Abdominal Pain      Prior to Admission medications  Medication Sig Start Date End Date Taking? Authorizing Provider  albuterol  (VENTOLIN  HFA) 108 (90 Base) MCG/ACT inhaler USE 2 PUFFS EVERY 6 HOURS AS NEEDED 05/12/24   Rakes, Rock HERO, FNP  amoxicillin -clavulanate (AUGMENTIN ) 875-125 MG tablet Take 1 tablet by mouth 2 (two) times daily. 08/19/24   Vivienne Delon HERO, PA-C  Blood Glucose Monitoring Suppl (ACCU-CHEK GUIDE ME) w/Device KIT Check BS QID Dx E10.8 06/04/24   Severa Rock HERO, FNP  fluconazole  (DIFLUCAN ) 150 MG tablet Take 1 tablet (150 mg total) by mouth every 3 (three) days as needed. 08/19/24   Vivienne Delon HERO, PA-C  glucose blood (ACCU-CHEK GUIDE TEST) test strip Check BS QID Dx E10.8 05/29/24   Severa Rock HERO, FNP  HUMULIN R  100 UNIT/ML injection INJECT 0.01ML (1 UNIT) INTO THE SKIN 2 TIMES DAILY BEFORE A MEAL <150(0UNITS); 150-199 (1U); 200-249(2U);250-299(3U); 300-349(4U); >350(5U) 06/08/24   Rakes, Rock HERO, FNP  levonorgestrel -ethinyl estradiol  (SEASONALE) 0.15-0.03 MG tablet Take 1 tablet by mouth daily. 05/27/24    Jayne Vonn DEL, MD  TRUEPLUS INSULIN  SYRINGE 31G X 5/16 0.5 ML MISC USE AS DIRECTED 08/21/23   Severa Rock HERO, FNP    Allergies: Patient has no known allergies.    Review of Systems  Gastrointestinal:  Positive for abdominal pain.  All other systems reviewed and are negative.   Updated Vital Signs BP 122/71   Pulse 86   Temp 98.5 F (36.9 C) (Oral)   Resp 16   Ht 5' 7 (1.702 m)   Wt 72.7 kg   SpO2 100%   BMI 25.10 kg/m   Physical Exam Vitals and nursing note reviewed.  Constitutional:      Appearance: She is well-developed.  HENT:     Head: Normocephalic.     Mouth/Throat:     Mouth: Mucous membranes are moist.  Cardiovascular:     Rate and Rhythm: Normal rate.  Pulmonary:     Effort: Pulmonary effort is normal.  Abdominal:     General: Abdomen is flat. Bowel sounds are increased. There is no distension.     Palpations: Abdomen is soft.  Musculoskeletal:        General: Normal range of motion.     Cervical back: Normal range of motion.  Skin:    General: Skin is warm.  Neurological:     General: No focal deficit present.     Mental Status: She is alert and oriented to person, place, and time.     (  all labs ordered are listed, but only abnormal results are displayed) Labs Reviewed  COMPREHENSIVE METABOLIC PANEL WITH GFR - Abnormal; Notable for the following components:      Result Value   Glucose, Bld 213 (*)    AST 14 (*)    All other components within normal limits  CBC - Abnormal; Notable for the following components:   WBC 14.5 (*)    Hemoglobin 9.6 (*)    HCT 34.2 (*)    MCV 71.5 (*)    MCH 20.1 (*)    MCHC 28.1 (*)    RDW 17.4 (*)    Platelets 468 (*)    All other components within normal limits  URINALYSIS, ROUTINE W REFLEX MICROSCOPIC - Abnormal; Notable for the following components:   APPearance HAZY (*)    Glucose, UA >=500 (*)    Protein, ur 100 (*)    Leukocytes,Ua MODERATE (*)    Bacteria, UA RARE (*)    All other components within  normal limits  LIPASE, BLOOD  PREGNANCY, URINE  TROPONIN T, HIGH SENSITIVITY    EKG: None  Radiology: No results found.   Procedures   Medications Ordered in the ED - No data to display                                  Medical Decision Making Patient complains of chest pain.  Patient was concerned that she could be having a heart attack.  Patient is insulin -dependent diabetic.  Amount and/or Complexity of Data Reviewed Labs: ordered. Decision-making details documented in ED Course.    Details: Labs ordered reviewed and interpreted troponin is negative lipase is normal ECG/medicine tests: ordered and independent interpretation performed. Decision-making details documented in ED Course.    Details: EKG normal sinus, normal ekg        Final diagnoses:  Nonspecific chest pain    ED Discharge Orders     None       An After Visit Summary was printed and given to the patient.    Breyon Sigg K, PA-C 09/09/24 1916    Melvenia Motto, MD 09/09/24 2246  "

## 2024-09-09 NOTE — ED Triage Notes (Signed)
 Pt arrived via POV c/o upper abdominal pain X 2 days that radiates up to her chest. Pt reports taking her medication w/o relief. Pt reports thinking it was either her anxiety or acid reflux but it has not improved.

## 2024-09-09 NOTE — Telephone Encounter (Signed)
 FYI Only or Action Required?: FYI only for provider: ED advised.  Patient was last seen in primary care on 08/19/2024 by Vivienne Delon HERO, PA-C.  Called Nurse Triage reporting Chest Pain.  Symptoms began several days ago.  Interventions attempted: Nothing.  Symptoms are: some symptoms have improved.  Triage Disposition: Call EMS 911 Now  Patient/caregiver understands and will follow disposition?: Yes    Copied from CRM #[id number]. Topic: Clinical - Red Word Triage >> Sep 09, 2024 12:19 PM Graeme ORN wrote: Red Word that prompted transfer to Nurse Triage: Heart concerns - thinks she possible had heart attack. Went to ED and they had a 20 hr wait so she left. Saw endocrinologist today and they advised she contact PCP as soon as possible. Request EKG. Thank you Reason for Disposition  [1] Chest pain lasts > 5 minutes AND [2] described as crushing, pressure-like, or heavy  Answer Assessment - Initial Assessment Questions 2 days ago pt was in grocery store with grandmother and pt began to have what felt like an elephant sitting on her chest. She states she thinks she had a heart attack. After that feeling, she had pain that went through her chest to the top of her belly button and then got worse quickly, turned white, was dripping sweat but was ice old, indigestion, nausea, lasted an hour. Still having chest tightness and upper abdominal pain, tried to go to the Er last night and there for 5 hours and found out it was a 20 hour wait and she left. She thought she would have better luck in [place] that's why she left there. She states the tightness is more on the constant side and has lingered. RN advised that patient needs to go back to the ER. Her symptoms are concerning and RN explained why that is the most appropriate place for care. Pt states her endocrinologist told her to contact us . RN advised Er is the best place for her. Pt stated understanding.     1. LOCATION: Where does it  hurt?       Entire chest 2. RADIATION: Does the pain go anywhere else? (e.g., into neck, jaw, arms, back)     Radiates to stomach 3. ONSET: When did the chest pain begin? (Minutes, hours or days)      2 days ago 4. PATTERN: Does the pain come and go, or has it been constant since it started?  Does it get worse with exertion?      Pretty much constant 5. DURATION: How long does it last (e.g., seconds, minutes, hours)      6. SEVERITY: How bad is the pain?  (e.g., Scale 1-10; mild, moderate, or severe)     More mild now 7. CARDIAC RISK FACTORS: Do you have any history of heart problems or risk factors for heart disease? (e.g., angina, prior heart attack; diabetes, high blood pressure, high cholesterol, smoker, or strong family history of heart disease)     diabetic 8. PULMONARY RISK FACTORS: Do you have any history of lung disease?  (e.g., blood clots in lung, asthma, emphysema, birth control pills)     asthma 9. CAUSE: What do you think is causing the chest pain?     Heart attack 10. OTHER SYMPTOMS: Do you have any other symptoms? (e.g., dizziness, nausea, vomiting, sweating, fever, difficulty breathing, cough)     None now  Protocols used: Chest Pain-A-AH

## 2024-09-09 NOTE — Discharge Instructions (Signed)
 Return if any problems.

## 2024-09-10 ENCOUNTER — Encounter: Payer: Self-pay | Admitting: Family

## 2024-09-10 ENCOUNTER — Telehealth: Admitting: Family

## 2024-09-10 ENCOUNTER — Ambulatory Visit: Payer: Self-pay

## 2024-09-10 DIAGNOSIS — K047 Periapical abscess without sinus: Secondary | ICD-10-CM

## 2024-09-10 DIAGNOSIS — K0889 Other specified disorders of teeth and supporting structures: Secondary | ICD-10-CM | POA: Diagnosis not present

## 2024-09-10 MED ORDER — CLINDAMYCIN HCL 300 MG PO CAPS: 300.0000 mg | ORAL_CAPSULE | Freq: Three times a day (TID) | ORAL | 0 refills | Status: AC

## 2024-09-10 MED ORDER — IBUPROFEN 600 MG PO TABS: 600.0000 mg | ORAL_TABLET | Freq: Three times a day (TID) | ORAL | 0 refills | Status: AC | PRN

## 2024-09-10 NOTE — Progress Notes (Signed)
 " Virtual Visit Consent   [name], you are scheduled for a virtual visit with a [facility] provider today. Just as with appointments in the office, your consent must be obtained to participate. Your consent will be active for this visit and any virtual visit you may have with one of our providers in the next 365 days. If you have a MyChart account, a copy of this consent can be sent to you electronically.  As this is a virtual visit, video technology does not allow for your provider to perform a traditional examination. This may limit your provider's ability to fully assess your condition. If your provider identifies any concerns that need to be evaluated in person or the need to arrange testing (such as labs, EKG, etc.), we will make arrangements to do so. Although advances in technology are sophisticated, we cannot ensure that it will always work on either your end or our end. If the connection with a video visit is poor, the visit may have to be switched to a telephone visit. With either a video or telephone visit, we are not always able to ensure that we have a secure connection.  By engaging in this virtual visit, you consent to the provision of healthcare and authorize for your insurance to be billed (if applicable) for the services provided during this visit. Depending on your insurance coverage, you may receive a charge related to this service.  I need to obtain your verbal consent now. Are you willing to proceed with your visit today? Brittany  L Wade has provided verbal consent on 09/10/2024 for a virtual visit (video or telephone). [name], FNP  Date: 09/10/2024 4:15 PM   Virtual Visit via Video Note   I, [name], connected with  Brittany  [name]  ([id number], August 29, 2001) on 09/10/2024 at  5:45 PM EST by a video-enabled telemedicine application and verified that I am speaking with the correct person using two identifiers.  Location: Patient: Virtual Visit Location Patient:  Other: car Provider: Virtual Visit Location Provider: Home Office   I discussed the limitations of evaluation and management by telemedicine and the availability of in person appointments. The patient expressed understanding and agreed to proceed.    History of Present Illness: Brittany  L Wade is a 24 y.o. who identifies as a female who was assigned female at birth, and is being seen today for dental pain on left lower gum that started a week ago that has worsen. She is able to eat, drink, and talk.  Reports gum swelling and aching pain of 10 out 10.   She is in the process of finding a dentist.   HPI: HPI  Problems:  Patient Active Problem List   Diagnosis Date Noted   Intermittent asthma without complication 05/12/2024   Rib pain on left side 04/15/2024   Type 1 diabetes mellitus without complications (HCC) 02/28/2022   Positive GAD antibody 02/28/2022   History of primary cesarean section 08/07/2021   Hyperlipidemia due to type 1 diabetes mellitus (HCC) 10/04/2020   Hidradenitis suppurativa 09/30/2018   Gastroesophageal reflux disease without esophagitis 05/26/2018   Anxiety state 04/04/2015   Panic attack 04/04/2015    Allergies: Allergies[1] Medications: Current Medications[2]  Observations/Objective: Patient is well-developed, well-nourished in no acute distress.  Resting comfortably  at home.  Head is normocephalic, atraumatic.  No labored breathing.  Speech is clear and coherent with logical content.  Patient is alert and oriented at baseline.  Left lower gum swelling and tenderness noted  Assessment and Plan: 1. Pain, dental (Primary) - clindamycin  (CLEOCIN ) 300 MG capsule; Take 1 capsule (300 mg total) by mouth 3 (three) times daily for 10 days.  Dispense: 30 capsule; Refill: 0 - ibuprofen  (ADVIL ) 600 MG tablet; Take 1 tablet (600 mg total) by mouth every 8 (eight) hours as needed.  Dispense: 60 tablet; Refill: 0  2. Dental abscess - clindamycin  (CLEOCIN ) 300 MG  capsule; Take 1 capsule (300 mg total) by mouth 3 (three) times daily for 10 days.  Dispense: 30 capsule; Refill: 0 - ibuprofen  (ADVIL ) 600 MG tablet; Take 1 tablet (600 mg total) by mouth every 8 (eight) hours as needed.  Dispense: 60 tablet; Refill: 0  Start Clindamycin  TID  Start Motrin  TID prn, no other NSAID's  Follow up with dentist Continue to stay hydrated Follow up if symptoms worsen or do not improve   Follow Up Instructions: I discussed the assessment and treatment plan with the patient. The patient was provided an opportunity to ask questions and all were answered. The patient agreed with the plan and demonstrated an understanding of the instructions.  A copy of instructions were sent to the patient via MyChart unless otherwise noted below.     The patient was advised to call back or seek an in-person evaluation if the symptoms worsen or if the condition fails to improve as anticipated.    [name], FNP    [1] No Known Allergies [2]  Current Outpatient Medications:    clindamycin  (CLEOCIN ) 300 MG capsule, Take 1 capsule (300 mg total) by mouth 3 (three) times daily for 10 days., Disp: 30 capsule, Rfl: 0   ibuprofen  (ADVIL ) 600 MG tablet, Take 1 tablet (600 mg total) by mouth every 8 (eight) hours as needed., Disp: 60 tablet, Rfl: 0   albuterol  (VENTOLIN  HFA) 108 (90 Base) MCG/ACT inhaler, USE 2 PUFFS EVERY 6 HOURS AS NEEDED, Disp: 8.5 g, Rfl: 1   Blood Glucose Monitoring Suppl (ACCU-CHEK GUIDE ME) w/Device KIT, Check BS QID Dx E10.8, Disp: 1 kit, Rfl: 0   glucose blood (ACCU-CHEK GUIDE TEST) test strip, Check BS QID Dx E10.8, Disp: 400 each, Rfl: 3   HUMULIN R  100 UNIT/ML injection, INJECT 0.01ML (1 UNIT) INTO THE SKIN 2 TIMES DAILY BEFORE A MEAL <150(0UNITS); 150-199 (1U); 200-249(2U);250-299(3U); 300-349(4U); >350(5U), Disp: 10 mL, Rfl: 3   levonorgestrel -ethinyl estradiol  (SEASONALE) 0.15-0.03 MG tablet, Take 1 tablet by mouth daily., Disp: 91 tablet, Rfl: 4    TRUEPLUS INSULIN  SYRINGE 31G X 5/16 0.5 ML MISC, USE AS DIRECTED, Disp: 200 each, Rfl: 3  "

## 2024-09-10 NOTE — Telephone Encounter (Signed)
 FYI Only or Action Required?: Action required by provider: request for appointment.  Patient was last seen in primary care on 08/19/2024 by Vivienne Delon HERO, PA-C.  Called Nurse Triage reporting Dental Pain.  Symptoms began several weeks ago.  Interventions attempted: Prescription medications: antibiotic.  Symptoms are: gradually worsening.States she has been on antibiotic for dental pain, and is not better. Pain left front of mouth and radiates into jaw, has chest tightness. No fever.  Triage Disposition: See HCP Within 4 Hours (Or PCP Triage)  Patient/caregiver understands and will follow disposition?:     Copied from CRM [id number]. Topic: Clinical - Red Word Triage >> Sep 10, 2024 12:36 PM Edsel HERO wrote: Severe pain in mouth Reason for Disposition  [1] SEVERE pain (e.g., excruciating, unable to eat, unable to do any normal activities) AND [2] not improved 2 hours after pain medicine  Answer Assessment - Initial Assessment Questions 1. LOCATION: Which tooth is hurting?  (e.g., right-side/left-side, upper/lower, front/back)     Front left side and down to jaw 2. ONSET: When did the toothache start?  (e.g., hours, days)      08/19/24 3. SEVERITY: How bad is the toothache?  (Scale 1-10; mild, moderate or severe)     10 4. SWELLING: Is there any visible swelling of your face?     yes 5. OTHER SYMPTOMS: Do you have any other symptoms? (e.g., fever)     Chest tight 6. PREGNANCY: Is there any chance you are pregnant? When was your last menstrual period?     no  Protocols used: Toothache-A-AH

## 2024-09-11 ENCOUNTER — Ambulatory Visit: Payer: Self-pay

## 2024-09-11 ENCOUNTER — Other Ambulatory Visit: Payer: Self-pay | Admitting: Obstetrics and Gynecology

## 2024-09-11 ENCOUNTER — Encounter: Payer: Self-pay | Admitting: Obstetrics & Gynecology

## 2024-09-11 MED ORDER — FLUCONAZOLE 150 MG PO TABS: 150.0000 mg | ORAL_TABLET | Freq: Once | ORAL | 0 refills | Status: AC

## 2024-09-11 NOTE — Telephone Encounter (Signed)
 Appt made

## 2024-09-11 NOTE — Telephone Encounter (Signed)
 FYI Only or Action Required?: FYI only for provider: appointment scheduled on 1/12.  Patient was last seen in primary care on 09/10/2024 by Lavell Bari LABOR, FNP.  Called Nurse Triage reporting Leg Swelling.  Symptoms began several weeks ago.  Interventions attempted: Prescription medications: Lasix .  Symptoms are: gradually worsening.  Triage Disposition: See PCP Within 2 Weeks  Patient/caregiver understands and will follow disposition?: Yes    Hx of intermittent bilateral leg swelling for the past few years. Recurrence moderate swelling 2 weeks ago. NO worse than previous episodes of swelling. No CP or SOB currently. Went to ED on 1/7 for CP and abdominal pain. Workup negative. Able to walk around. No pain or fever or redness. No hx of CHF or blood clot. Normally takes lasix . Ran out a month ago. Asking for a refill. Advised to be seen in next 2 weeks for ED f/u and to assess current recurrence of chronic swelling. Scheduled appt with different provider at home office Monday d/t no PCP availability within timeframe. Advised UC or ED for worsening symptoms.     Copied from CRM [id number]. Topic: Clinical - Red Word Triage >> Sep 11, 2024  1:02 PM Avram MATSU wrote: Red Word that prompted transfer to Nurse Triage: pt has swelling of the legs and stated it can get worse without   urosemide (LASIX ) 20 MG tablet [[id number]]  DISCONTINUED  ----------------------------------------------------------------------- From previous Reason for Contact - Medication Question: Reason for CRM: Reason for Disposition  [1] MILD swelling of both ankles (i.e., pedal edema) AND [2] is a chronic symptom (recurrent or ongoing AND present > 4 weeks)    Moderate swelling both leg, chronic for the past few years. Comes and goes, not worse than normal.  Answer Assessment - Initial Assessment Questions 1. ONSET: When did the swelling start? (e.g., minutes, hours, days)     2 weeks ago. Has hx of fairly constant  bilateral leg swelling  2. LOCATION: What part of the leg is swollen?  Are both legs swollen or just one leg?     Both legs up to knees   3. SEVERITY: How bad is the swelling? (e.g., localized; mild, moderate, severe)     Moderate  4. REDNESS: Is there redness or signs of infection?     Denies  5. PAIN: Is the swelling painful to touch? If Yes, ask: How painful is it?   (Scale 1-10; mild, moderate or severe)     Denies  6. FEVER: Do you have a fever? If Yes, ask: What is it, how was it measured, and when did it start?      Denies  7. CAUSE: What do you think is causing the leg swelling?     Generally retaining fluid  8. MEDICAL HISTORY: Do you have a history of blood clots (e.g., DVT), cancer, heart failure, kidney disease, or liver failure?     Denies  9. RECURRENT SYMPTOM: Have you had leg swelling before? If Yes, ask: When was the last time? What happened that time?     Hx of chronic swelling in BLE  10. OTHER SYMPTOMS: Do you have any other symptoms? (e.g., chest pain, difficulty breathing)       None currently  11. PREGNANCY: Is there any chance you are pregnant? When was your last menstrual period?       Denies  Protocols used: Leg Swelling and Edema-A-AH

## 2024-09-11 NOTE — Progress Notes (Signed)
"  Rx sent for diflucan   "

## 2024-09-12 LAB — URINE CULTURE
Culture: >=100000 — AB
Special Requests: NORMAL

## 2024-09-13 ENCOUNTER — Telehealth (HOSPITAL_BASED_OUTPATIENT_CLINIC_OR_DEPARTMENT_OTHER): Payer: Self-pay | Admitting: *Deleted

## 2024-09-13 NOTE — Telephone Encounter (Signed)
 Post ED Visit - Positive Culture Follow-up  Culture report reviewed by antimicrobial stewardship pharmacist: [facility] []  Rankin Dee, Pharm.D. []  Venetia Gully, Pharm.D., BCPS AQ-ID []  Garrel Crews, Pharm.D., BCPS []  Almarie Lunger, Pharm.D., BCPS []  [place], [address].D., BCPS, AAHIVP []  Rosaline Bihari, Pharm.D., BCPS, AAHIVP []  Vernell Meier, PharmD, BCPS []  Latanya Hint, PharmD, BCPS []  Donald Medley, PharmD, BCPS []  Rocky Bold, PharmD []  Dorothyann Alert, PharmD, BCPS [x]  Dorn Poot, PharmD  [facility] []  Rosaline Edison, PharmD []  Romona Bliss, PharmD []  Dolphus Roller, PharmD []  Veva Seip, Rph []  Vernell Daunt) Leonce, PharmD []  Eva Allis, PharmD []  Rosaline Millet, PharmD []  Iantha Batch, PharmD []  Arvin Gauss, PharmD []  Wanda Hasting, PharmD []  Ronal Rav, PharmD []  Rocky Slade, PharmD []  Bard Jeans, PharmD   Positive urine culture Do not treat per Neda Mays, PA  Brittany Wade 09/13/2024, 10:31 AM

## 2024-09-14 ENCOUNTER — Ambulatory Visit: Admitting: Nurse Practitioner

## 2024-09-14 ENCOUNTER — Encounter: Payer: Self-pay | Admitting: Nurse Practitioner

## 2024-09-14 DIAGNOSIS — R079 Chest pain, unspecified: Secondary | ICD-10-CM | POA: Diagnosis not present

## 2024-09-14 DIAGNOSIS — F410 Panic disorder [episodic paroxysmal anxiety] without agoraphobia: Secondary | ICD-10-CM

## 2024-09-14 DIAGNOSIS — E109 Type 1 diabetes mellitus without complications: Secondary | ICD-10-CM

## 2024-09-14 DIAGNOSIS — F411 Generalized anxiety disorder: Secondary | ICD-10-CM

## 2024-09-14 DIAGNOSIS — Z09 Encounter for follow-up examination after completed treatment for conditions other than malignant neoplasm: Secondary | ICD-10-CM | POA: Diagnosis not present

## 2024-09-14 DIAGNOSIS — R600 Localized edema: Secondary | ICD-10-CM | POA: Insufficient documentation

## 2024-09-14 DIAGNOSIS — L732 Hidradenitis suppurativa: Secondary | ICD-10-CM | POA: Diagnosis not present

## 2024-09-14 MED ORDER — FUROSEMIDE 20 MG PO TABS: 20.0000 mg | ORAL_TABLET | Freq: Every day | ORAL | 0 refills | Status: DC | PRN

## 2024-09-14 NOTE — Progress Notes (Signed)
 "    Subjective:  Patient ID: Brittany  LITTIE Wade, female    DOB: 07-30-2001, 24 y.o.   MRN: [id number]  Patient Care Team: Severa Rock HERO, FNP as PCP - General (Family Medicine) Merlynn Niki FALCON, FNP (Family Medicine)   Chief Complaint:  Hospitalization Follow-up (No concerns at this time. )   HPI: Brittany  L Wade is a 24 y.o. female presenting on 09/14/2024 for Hospitalization Follow-up (No concerns at this time. )   Discussed the use of AI scribe software for clinical note transcription with the patient, who gave verbal consent to proceed.  History of Present Illness Brittany Wade is a 24 year old female with type 1 diabetes who presents for a follow-up regarding fluid retention and medication management.  She experiences fluid retention in her legs, starting from her feet and ankles and extending up to her knees, approximately three times a week. This was previously managed with furosemide , which was discontinued during her pregnancy. She has not needed a refill until recently when she ran out of her supply. She manages the swelling by elevating her legs when sitting.  She has type 1 diabetes with her last A1c recorded at 9.2% on September 09, 2024. She is currently using a Dexcom continuous glucose monitor and is on different insulins. She is scheduled to receive a tubeless insulin  pump on September 24, 2024. Her blood glucose reading today is 117 mg/dL. Despite having diabetes, she has not had a recent eye exam.  She has a history of anxiety and panic attacks since high school. She has tried various medications in the past, including Seroquel and Paxil, but found that coping mechanisms were more effective. Recently, she experienced a severe anxiety attack while at [facility], which prompted her to get an EKG to rule out a heart attack. The EKG results were normal.  She has a history of hidradenitis suppurativa, with flare-ups occurring around her thighs and buttocks. The last flare-up was a few  months ago.  She smokes less than half a pack of cigarettes a day. 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       09/14/2024   11:02 AM 05/12/2024    2:01 PM 07/10/2022    1:37 PM  PHQ9 SCORE ONLY  PHQ-9 Total Score 1 2 1       Data saved with a previous flowsheet row definition       09/14/2024   11:02 AM 05/12/2024    2:02 PM 07/10/2022    1:37 PM 02/28/2022    3:32 PM  GAD 7 : Generalized Anxiety Score  Nervous, Anxious, on Edge 1 1 1  0  Control/stop worrying 0 0 0 0  Worry too much - different things 0 1 1 0  Trouble relaxing 0 0 0 0  Restless 0 0 0 0  Easily annoyed or irritable 0 0 0 0  Afraid - awful might happen 0 0 0 0  Total GAD 7 Score 1 2 2  0  Anxiety Difficulty Not difficult at all Somewhat difficult  Not difficult at all      Relevant past medical, surgical, family, and social history reviewed and updated as indicated.  Allergies and medications reviewed and updated. Data reviewed: Chart in Epic.   Past Medical History:  Diagnosis Date   Anxiety    Circadian rhythm disorder 04/04/2015   Constipation  Depression    Diabetes mellitus without complication (HCC)    GERD (gastroesophageal reflux disease)    Migraine 05/21/2018   Tremor 04/04/2015    Past Surgical History:  Procedure Laterality Date   ADENOIDECTOMY     CESAREAN SECTION N/A 08/07/2021   Procedure: CESAREAN SECTION;  Surgeon: Cleatus Moccasin, MD;  Location: [facility] LD ORS;  Service: Obstetrics;  Laterality: N/A;   MOUTH SURGERY     TONSILLECTOMY      Social History   Socioeconomic History   Marital status: Significant Other    Spouse name: Not on file   Number of children: Not on file   Years of education: Not on file   Highest education level: Not on file  Occupational History   Not on file  Tobacco Use   Smoking status: Former    Current packs/day: 0.50     Average packs/day: 0.5 packs/day for 4.0 years (2.0 ttl pk-yrs)    Types: Cigarettes    Passive exposure: Past   Smokeless tobacco: Never  Vaping Use   Vaping status: Every Day  Substance and Sexual Activity   Alcohol use: No   Drug use: No   Sexual activity: Yes    Birth control/protection: None, Condom  Other Topics Concern   Not on file  Social History Narrative   Not on file   Social Drivers of Health   Tobacco Use: Medium Risk (09/14/2024)   Patient History    Smoking Tobacco Use: Former    Smokeless Tobacco Use: Never    Passive Exposure: Past  Physicist, Medical Strain: Low Risk (07/10/2022)   Overall Financial Resource Strain (CARDIA)    Difficulty of Paying Living Expenses: Not very hard  Food Insecurity: Food Insecurity Present (07/10/2022)   Hunger Vital Sign    Worried About Running Out of Food in the Last Year: Never true    Ran Out of Food in the Last Year: Sometimes true  Transportation Needs: No Transportation Needs (07/10/2022)   PRAPARE - Administrator, Civil Service (Medical): No    Lack of Transportation (Non-Medical): No  Physical Activity: Sufficiently Active (07/10/2022)   Exercise Vital Sign    Days of Exercise per Week: 3 days    Minutes of Exercise per Session: 60 min  Stress: No Stress Concern Present (07/10/2022)   [employer] of Occupational Health - Occupational Stress Questionnaire    Feeling of Stress : Only a little  Social Connections: Moderately Isolated (07/10/2022)   Social Connection and Isolation Panel    Frequency of Communication with Friends and Family: Three times a week    Frequency of Social Gatherings with Friends and Family: Three times a week    Attends Religious Services: Never    Active Member of Clubs or Organizations: No    Attends Banker Meetings: Never    Marital Status: Living with partner  Intimate Partner Violence: Not At Risk (07/10/2022)   Humiliation, Afraid, Rape, and Kick  questionnaire    Fear of Current or Ex-Partner: No    Emotionally Abused: No    Physically Abused: No    Sexually Abused: No  Depression (PHQ2-9): Low Risk (09/14/2024)   Depression (PHQ2-9)    PHQ-2 Score: 1  Alcohol Screen: Low Risk (07/10/2022)   Alcohol Screen    Last Alcohol Screening Score (AUDIT): 2  Housing: Low Risk (07/10/2022)   Housing    Last Housing Risk Score: 0  Utilities: Not At Risk (07/10/2022)   AHC  Utilities    Threatened with loss of utilities: No  Health Literacy: Not on file    Outpatient Encounter Medications as of 09/14/2024  Medication Sig   albuterol  (VENTOLIN  HFA) 108 (90 Base) MCG/ACT inhaler USE 2 PUFFS EVERY 6 HOURS AS NEEDED   Blood Glucose Monitoring Suppl (ACCU-CHEK GUIDE ME) w/Device KIT Check BS QID Dx E10.8   clindamycin  (CLEOCIN ) 300 MG capsule Take 1 capsule (300 mg total) by mouth 3 (three) times daily for 10 days.   fluconazole  (DIFLUCAN ) 150 MG tablet Take 150 mg by mouth every 3 (three) days.   furosemide  (LASIX ) 20 MG tablet Take 1 tablet (20 mg total) by mouth daily as needed.   glucose blood (ACCU-CHEK GUIDE TEST) test strip Check BS QID Dx E10.8   ibuprofen  (ADVIL ) 600 MG tablet Take 1 tablet (600 mg total) by mouth every 8 (eight) hours as needed.   Insulin  Aspart FlexPen (NOVOLOG ) 100 UNIT/ML Inject 10 units before meals plus sliding scale 1:50 >150. Max TDD 55   Insulin  Glargine (BASAGLAR  KWIKPEN) 100 UNIT/ML Inject 30 Units into the skin.   levonorgestrel -ethinyl estradiol  (SEASONALE) 0.15-0.03 MG tablet Take 1 tablet by mouth daily.   TRUEPLUS INSULIN  SYRINGE 31G X 5/16 0.5 ML MISC USE AS DIRECTED   HUMULIN R  100 UNIT/ML injection INJECT 0.01ML (1 UNIT) INTO THE SKIN 2 TIMES DAILY BEFORE A MEAL <150(0UNITS); 150-199 (1U); 200-249(2U);250-299(3U); 300-349(4U); >350(5U) (Patient not taking: Reported on 09/14/2024)   No facility-administered encounter medications on file as of 09/14/2024.    Allergies[1]  Pertinent ROS per HPI,  otherwise unremarkable      Objective:  BP 102/65   Pulse (!) 58   Temp (!) 97.1 F (36.2 C)   Ht 5' 7 (1.702 m)   Wt 175 lb (79.4 kg)   SpO2 96%   BMI 27.41 kg/m    Wt Readings from Last 3 Encounters:  09/14/24 175 lb (79.4 kg)  09/09/24 160 lb 4.4 oz (72.7 kg)  05/13/24 160 lb 3.2 oz (72.7 kg)    Physical Exam Vitals and nursing note reviewed.  Constitutional:      General: She is not in acute distress. HENT:     Head: Normocephalic and atraumatic.     Right Ear: Tympanic membrane, ear canal and external ear normal. There is no impacted cerumen.     Left Ear: Tympanic membrane, ear canal and external ear normal. There is no impacted cerumen.     Nose: Nose normal.     Mouth/Throat:     Mouth: Mucous membranes are moist.  Eyes:     General: No scleral icterus.    Extraocular Movements: Extraocular movements intact.     Conjunctiva/sclera: Conjunctivae normal.     Pupils: Pupils are equal, round, and reactive to light.  Neck:     Vascular: No carotid bruit.  Pulmonary:     Effort: Pulmonary effort is normal.     Breath sounds: Normal breath sounds.  Musculoskeletal:        General: Normal range of motion.     Cervical back: Normal range of motion and neck supple. No rigidity or tenderness.     Right lower leg: No edema.     Left lower leg: No edema.  Lymphadenopathy:     Cervical: No cervical adenopathy.  Skin:    General: Skin is warm and dry.  Neurological:     Mental Status: She is alert and oriented to person, place, and time.  Psychiatric:  Mood and Affect: Mood normal.        Thought Content: Thought content normal.        Judgment: Judgment normal.    Physical Exam      Results for orders placed or performed during the hospital encounter of 09/09/24  Urine Culture   Collection Time: 09/09/24  4:28 PM   Specimen: Urine, Clean Catch  Result Value Ref Range   Specimen Description      URINE, CLEAN CATCH Performed at [facility], [address], [place], [place] [address]    Special Requests      Normal Performed at [facility], [address] [place], [place] [address]    Culture >=100,000 COLONIES/mL KLEBSIELLA PNEUMONIAE (A)    Report Status 09/12/2024 FINAL    Organism ID, Bacteria KLEBSIELLA PNEUMONIAE (A)       Susceptibility   Klebsiella pneumoniae - MIC*    AMPICILLIN >=32 RESISTANT Resistant     CEFAZOLIN  (URINE) Value in next row Sensitive      4 SENSITIVEThis is a modified FDA-approved test that has been validated and its performance characteristics determined by the reporting laboratory.  This laboratory is certified under the Clinical Laboratory Improvement Amendments CLIA as qualified to perform high complexity clinical laboratory testing.    CEFEPIME Value in next row Sensitive      4 SENSITIVEThis is a modified FDA-approved test that has been validated and its performance characteristics determined by the reporting laboratory.  This laboratory is certified under the Clinical Laboratory Improvement Amendments CLIA as qualified to perform high complexity clinical laboratory testing.    ERTAPENEM Value in next row Sensitive      4 SENSITIVEThis is a modified FDA-approved test that has been validated and its performance characteristics determined by the reporting laboratory.  This laboratory is certified under the Clinical Laboratory Improvement Amendments CLIA as qualified to perform high complexity clinical laboratory testing.    CEFTRIAXONE  Value in next row Sensitive      4 SENSITIVEThis is a modified FDA-approved test that has been validated and its performance characteristics determined by the reporting laboratory.  This laboratory is certified under the Clinical Laboratory Improvement Amendments CLIA as qualified to perform high complexity clinical laboratory testing.    CIPROFLOXACIN Value in next row Sensitive      4 SENSITIVEThis is a modified FDA-approved test that has been validated and its  performance characteristics determined by the reporting laboratory.  This laboratory is certified under the Clinical Laboratory Improvement Amendments CLIA as qualified to perform high complexity clinical laboratory testing.    GENTAMICIN Value in next row Sensitive      4 SENSITIVEThis is a modified FDA-approved test that has been validated and its performance characteristics determined by the reporting laboratory.  This laboratory is certified under the Clinical Laboratory Improvement Amendments CLIA as qualified to perform high complexity clinical laboratory testing.    NITROFURANTOIN Value in next row Intermediate      4 SENSITIVEThis is a modified FDA-approved test that has been validated and its performance characteristics determined by the reporting laboratory.  This laboratory is certified under the Clinical Laboratory Improvement Amendments CLIA as qualified to perform high complexity clinical laboratory testing.    TRIMETH/SULFA Value in next row Sensitive      4 SENSITIVEThis is a modified FDA-approved test that has been validated and its performance characteristics determined by the reporting laboratory.  This laboratory is certified under the Clinical Laboratory Improvement Amendments CLIA as qualified to perform  high complexity clinical laboratory testing.    AMPICILLIN/SULBACTAM Value in next row Sensitive      4 SENSITIVEThis is a modified FDA-approved test that has been validated and its performance characteristics determined by the reporting laboratory.  This laboratory is certified under the Clinical Laboratory Improvement Amendments CLIA as qualified to perform high complexity clinical laboratory testing.    PIP/TAZO Value in next row Sensitive      <=4 SENSITIVEThis is a modified FDA-approved test that has been validated and its performance characteristics determined by the reporting laboratory.  This laboratory is certified under the Clinical Laboratory Improvement Amendments CLIA as  qualified to perform high complexity clinical laboratory testing.    MEROPENEM Value in next row Sensitive      <=4 SENSITIVEThis is a modified FDA-approved test that has been validated and its performance characteristics determined by the reporting laboratory.  This laboratory is certified under the Clinical Laboratory Improvement Amendments CLIA as qualified to perform high complexity clinical laboratory testing.    * >=100,000 COLONIES/mL KLEBSIELLA PNEUMONIAE  Urinalysis, Routine w reflex microscopic -Urine, Clean Catch   Collection Time: 09/09/24  4:28 PM  Result Value Ref Range   Color, Urine YELLOW YELLOW   APPearance HAZY (A) CLEAR   Specific Gravity, Urine 1.028 1.005 - 1.030   pH 5.0 5.0 - 8.0   Glucose, UA >=500 (A) NEGATIVE mg/dL   Hgb urine dipstick NEGATIVE NEGATIVE   Bilirubin Urine NEGATIVE NEGATIVE   Ketones, ur NEGATIVE NEGATIVE mg/dL   Protein, ur 899 (A) NEGATIVE mg/dL   Nitrite NEGATIVE NEGATIVE   Leukocytes,Ua MODERATE (A) NEGATIVE   RBC / HPF 0-5 0 - 5 RBC/hpf   WBC, UA >50 0 - 5 WBC/hpf   Bacteria, UA RARE (A) NONE SEEN   Squamous Epithelial / HPF 0-5 0 - 5 /HPF   Mucus PRESENT   Pregnancy, urine   Collection Time: 09/09/24  4:28 PM  Result Value Ref Range   Preg Test, Ur NEGATIVE NEGATIVE  Lipase, blood   Collection Time: 09/09/24  4:37 PM  Result Value Ref Range   Lipase 16 11 - 51 U/L  Comprehensive metabolic panel   Collection Time: 09/09/24  4:37 PM  Result Value Ref Range   Sodium 137 135 - 145 mmol/L   Potassium 4.0 3.5 - 5.1 mmol/L   Chloride 101 98 - 111 mmol/L   CO2 25 22 - 32 mmol/L   Glucose, Bld 213 (H) 70 - 99 mg/dL   BUN 6 6 - 20 mg/dL   Creatinine, Ser 9.35 0.44 - 1.00 mg/dL   Calcium  9.4 8.9 - 10.3 mg/dL   Total Protein 8.1 6.5 - 8.1 g/dL   Albumin 4.7 3.5 - 5.0 g/dL   AST 14 (L) 15 - 41 U/L   ALT <5 0 - 44 U/L   Alkaline Phosphatase 70 38 - 126 U/L   Total Bilirubin 0.2 0.0 - 1.2 mg/dL   GFR, Estimated >39 >39 mL/min   Anion  gap 12 5 - 15  CBC   Collection Time: 09/09/24  4:37 PM  Result Value Ref Range   WBC 14.5 (H) 4.0 - 10.5 K/uL   RBC 4.78 3.87 - 5.11 MIL/uL   Hemoglobin 9.6 (L) 12.0 - 15.0 g/dL   HCT 65.7 (L) 63.9 - 53.9 %   MCV 71.5 (L) 80.0 - 100.0 fL   MCH 20.1 (L) 26.0 - 34.0 pg   MCHC 28.1 (L) 30.0 - 36.0 g/dL   RDW 82.5 (H)  11.5 - 15.5 %   Platelets 468 (H) 150 - 400 K/uL   nRBC 0.0 0.0 - 0.2 %  Troponin T, High Sensitivity   Collection Time: 09/09/24  4:37 PM  Result Value Ref Range   Troponin T High Sensitivity <15 0 - 19 ng/L       Pertinent labs & imaging results that were available during my care of the patient were reviewed by me and considered in my medical decision making.  Assessment & Plan:  Brittany Wade  was seen today for hospitalization follow-up.  Diagnoses and all orders for this visit:  Hospital discharge follow-up  Chest pain, unspecified type  Peripheral edema -     furosemide  (LASIX ) 20 MG tablet; Take 1 tablet (20 mg total) by mouth daily as needed.  Type 1 diabetes mellitus without complications (HCC) -     Ambulatory referral to Optometry  Panic attack  Anxiety state  Hidradenitis suppurativa     Assessment and Plan Brittany Wade is a 24 year old Caucasian female seen today for hospital discharge follow-up, no acute distress Assessment & Plan Peripheral edema Chronic peripheral edema primarily in legs, previously managed with furosemide , discontinued during pregnancy. - Refilled furosemide  20 mg as needed. - Advised leg elevation when sitting.  Type 1 diabetes mellitus Type 1 diabetes with A1c of 9.2 in January 2026. Under endocrinologist care, using Dexcom and multiple insulins. Scheduled for insulin  pump initiation. - Continue endocrinologist follow-up. - Proceed with insulin  pump initiation on January 22nd, 2026.  Hidradenitis suppurativa Chronic hidradenitis suppurativa with flare-ups around thighs and buttocks.  Anxiety and panic disorder Anxiety and  panic attacks with recent exacerbation. Prefers coping strategies over medication. - Advised follow-up with mental health provider, or PCP for potential medication management.      Continue all other maintenance medications.  Follow up plan: Return if symptoms worsen or fail to improve.   Continue healthy lifestyle choices, including diet (rich in fruits, vegetables, and lean proteins, and low in salt and simple carbohydrates) and exercise (at least 30 minutes of moderate physical activity daily).  Educational handout given for   Clinical References  Managing Anxiety, Adult After being diagnosed with anxiety, you may be relieved to know why you have felt or behaved a certain way. You may also feel overwhelmed about the treatment ahead and what it will mean for your life. With care and support, you can manage your anxiety. How to manage lifestyle changes Understanding the difference between stress and anxiety Although stress can play a role in anxiety, it is not the same as anxiety. Stress is your body's reaction to life changes and events, both good and bad. Stress is often caused by something external, such as a deadline, test, or competition. It normally goes away after the event has ended and will last just a few hours. But, stress can be ongoing and can lead to more than just stress. Anxiety is caused by something internal, such as imagining a terrible outcome or worrying that something will go wrong that will greatly upset you. Anxiety often does not go away even after the event is over, and it can become a long-term (chronic) worry. Lowering stress and anxiety Talk with your health care provider or a counselor to learn more about lowering anxiety and stress. They may suggest tension-reduction techniques, such as: Music. Spend time creating or listening to music that you enjoy and that inspires you. Mindfulness-based meditation. Practice being aware of your normal breaths while not  trying to control your breathing.  It can be done while sitting or walking. Centering prayer. Focus on a word, phrase, or sacred image that means something to you and brings you peace. Deep breathing. Expand your stomach and inhale slowly through your nose. Hold your breath for 3-5 seconds. Then breathe out slowly, letting your stomach muscles relax. Self-talk. Learn to notice and spot thought patterns that lead to anxiety reactions. Change those patterns to thoughts that feel peaceful. Muscle relaxation. Take time to tense muscles and then relax them. Choose a tension-reduction technique that fits your lifestyle and personality. These techniques take time and practice. Set aside 5-15 minutes a day to do them. Specialized therapists can offer counseling and training in these techniques. The training to help with anxiety may be covered by some insurance plans. Other things you can do to manage stress and anxiety include: Keeping a stress diary. This can help you learn what triggers your reaction and then learn ways to manage your response. Thinking about how you react to certain situations. You may not be able to control everything, but you can control your response. Making time for activities that help you relax and not feeling guilty about spending your time in this way. Doing visual imagery. This involves imagining or creating mental pictures to help you relax. Practicing yoga. Through yoga poses, you can lower tension and relax.   Medicines Medicines for anxiety include: Antidepressant medicines. These are usually prescribed for long-term daily control. Anti-anxiety medicines. These may be added in severe cases, especially when panic attacks occur. When used together, medicines, psychotherapy, and tension-reduction techniques may be the most effective treatment. Relationships Relationships can play a big part in helping you recover. Spend more time connecting with trusted friends and family  members. Think about going to couples counseling if you have a partner, taking family education classes, or going to family therapy. Therapy can help you and others better understand your anxiety. How to recognize changes in your anxiety Everyone responds differently to treatment for anxiety. Recovery from anxiety happens when symptoms lessen and stop interfering with your daily life at home or work. This may mean that you will start to: Have better concentration and focus. Worry will interfere less in your daily thinking. Sleep better. Be less irritable. Have more energy. Have improved memory. Try to recognize when your condition is getting worse. Contact your provider if your symptoms interfere with home or work and you feel like your condition is not improving. Follow these instructions at home: Activity Exercise. Adults should: Exercise for at least 150 minutes each week. The exercise should increase your heart rate and make you sweat (moderate-intensity exercise). Do strengthening exercises at least twice a week. Get the right amount and quality of sleep. Most adults need 7-9 hours of sleep each night. Lifestyle  Eat a healthy diet that includes plenty of vegetables, fruits, whole grains, low-fat dairy products, and lean protein. Do not eat a lot of foods that are high in fats, added sugars, or salt (sodium). Make choices that simplify your life. Do not use any products that contain nicotine or tobacco. These products include cigarettes, chewing tobacco, and vaping devices, such as e-cigarettes. If you need help quitting, ask your provider. Avoid caffeine, alcohol, and certain over-the-counter cold medicines. These may make you feel worse. Ask your pharmacist which medicines to avoid. General instructions Take over-the-counter and prescription medicines only as told by your provider. Keep all follow-up visits. This is to make sure you are managing your anxiety well or if  you need more  support. Where to find support You can get help and support from: Self-help groups. Online and [employer]. A trusted spiritual leader. Couples counseling. Family education classes. Family therapy. Where to find more information You may find that joining a support group helps you deal with your anxiety. The following sources can help you find counselors or support groups near you: Mental Health America: mentalhealthamerica.net Anxiety and Depression Association of America (ADAA): adaa.org The [employer] on Mental Illness (NAMI): nami.org Contact a health care provider if: You have a hard time staying focused or finishing tasks. You spend many hours a day feeling worried about everyday life. You are very tired because you cannot stop worrying. You start to have headaches or often feel tense. You have chronic nausea or diarrhea. Get help right away if: Your heart feels like it is racing. You have shortness of breath. You have thoughts of hurting yourself or others. Get help right away if you feel like you may hurt yourself or others, or have thoughts about taking your own life. Go to your nearest emergency room or: Call 911. Call the National Suicide Prevention Lifeline at 865-582-0347 or 988. This is open 24 hours a day. Text the Crisis Text Line at 901-151-7582. This information is not intended to replace advice given to you by your health care provider. Make sure you discuss any questions you have with your health care provider. Document Revised: 05/29/2022 Document Reviewed: 12/11/2020 Elsevier Patient Education  2024 Elsevier Inc. Mindfulness-Based Stress Reduction: What to Know Mindfulness-based stress reduction (MBSR) is a mindfulness meditation program that normally takes place over 8 weeks. It usually includes weekly group classes and daily exercises to do at home. What are the benefits of MBSR? Mindfulness meditation therapies, like MBSR, can change a person's  brain and body in good ways, and make them healthier. MBSR can have many benefits, such as: Helping to lower stress hormones. Decreasing symptoms or helping to deal with symptoms of different conditions, like: Anxiety, which is feeling worried or nervous. Long-lasting pain. This is pain that lasts more than 3 months. Stress and worry. Trouble sleeping. Headaches, like migraines and tension headaches. Irritable bowel syndrome. Helping to handle stress from things you can't control, like: Long-term illnesses, especially if you have a lot of pain or other difficult symptoms. Big life events. Stress at work. Stress from taking care of someone else. Types of MBSR exercises Mindfulness. This is a common type of meditation. Meditation. It helps you focus your mind to feel calm and happy. It has two main parts: paying attention and accepting. Paying attention means focusing on what is happening right now. This usually means noticing your breathing, your thoughts, how your body feels, and your emotions. Accepting means noticing these feelings and sensations without judging them. Instead of reacting to these thoughts or feelings, you just observe them and let them pass. MSBR exercises include: Body scanning. This is a mindfulness exercise where you pay attention to how different parts of your body feel. You can do this while lying down or sitting up. Sitting meditations. In this exercise, you focus on something like your breathing. When your mind starts to wander, gently bring it back to your breath. Keep doing this every time you notice your mind wandering. Mindful movements. This exercise involves moving and stretching your body slowly while paying attention to how it feels. Mindful Tasks. This means paying attention to how your body feels while doing things like walking or eating. Follow these  instructions at home:  Find an in-person MBSR program or find a program that is online. Find a  podcast or recording that provides guidance for MSBR exercises. Look for a therapist who knows how to use MBSR. Follow your treatment plan as told by your health care provider. This may include taking regular medicines and making changes to your diet or lifestyle. Where to find more information You can find more information about MBSR from: Your provider. Community-based meditation centers or programs. American Psychological Association at http://forbes-duran.com/. This information is not intended to replace advice given to you by your health care provider. Make sure you discuss any questions you have with your health care provider. Document Revised: 10/24/2023 Document Reviewed: 10/24/2023 Elsevier Patient Education  2025 Elsevier Inc. Hidradenitis Suppurativa Hidradenitis suppurativa is a long-term (chronic) skin disease. It is similar to a severe form of acne, but it affects areas of the body where acne would be unusual, especially areas of the body where skin rubs against skin and becomes moist. These include: Underarms. Groin. Genital area. Buttocks. Upper thighs. Breasts. Hidradenitis suppurativa may start out as small lumps or pimples caused by blocked skin pores, sweat glands, or hair follicles. Pimples may develop into deep sores that break open (rupture) and drain pus. Over time, affected areas of skin may thicken and become scarred. This condition is rare and does not spread from person to person (non-contagious). What are the causes? The exact cause of this condition is not known. It may be related to: Female and female hormones. An overactive disease-fighting system (immune system). The immune system may over-react to blocked hair follicles or sweat glands and cause swelling and pus-filled sores. What increases the risk? You are more likely to develop this condition if you: Are female. Are 84-60 years old. Have a family history of hidradenitis  suppurativa. Have a personal history of acne. Are overweight. Smoke. Take the medicine lithium. What are the signs or symptoms? The first symptoms are usually painful bumps in the skin, similar to pimples. The condition may get worse over time (progress), or it may only cause mild symptoms. If the disease progresses, symptoms may include: Skin bumps getting bigger and growing deeper into the skin. Bumps rupturing and draining pus. Itchy, infected skin. Skin getting thicker and scarred. Tunnels under the skin (fistulas) where pus drains from a bump. Pain during daily activities, such as pain during walking if your groin area is affected. Emotional problems, such as stress or depression. This condition may affect your appearance and your ability or willingness to wear certain clothes or do certain activities. How is this diagnosed? This condition is diagnosed by a health care provider who specializes in skin conditions (dermatologist). You may be diagnosed based on: Your symptoms and medical history. A physical exam. Testing a pus sample for infection. Blood tests. How is this treated? Your treatment will depend on how severe your symptoms are. The same treatment will not work for everybody with this condition. You may need to try several treatments to find what works best for you. Treatment may include: Cleaning and bandaging (dressing) your wounds as needed. Lifestyle changes, such as new skin care routines. Taking medicines, such as: Antibiotics. Acne medicines. Medicines to reduce the activity of the immune system. A diabetes medicine (metformin ). Birth control pills, for women. Steroids to reduce swelling and pain. Working with a mental health care provider, if you experience emotional distress due to this condition. If you have severe symptoms that do not get better  with medicine, you may need surgery. Surgery may involve: Using a laser to clear the skin and remove hair  follicles. Opening and draining deep sores. Removing the areas of skin that are diseased and scarred. Follow these instructions at home: Medicines  Take over-the-counter and prescription medicines only as told by your health care provider. If you were prescribed antibiotics, take them as told by your health care provider. Do not stop using the antibiotic even if your condition improves. Skin care If you have open wounds, cover them with a clean dressing as told by your health care provider. Keep wounds clean by washing them gently with soap and water  when you bathe. Do not shave the areas where you get hidradenitis suppurativa. Wear loose-fitting clothes. Try to avoid getting overheated or sweaty. If you get sweaty or wet, change into clean, dry clothes as soon as you can. To help relieve pain and itchiness, cover sore areas with a warm, clean washcloth (warm compress) for 5-10 minutes as often as needed. Your healthcare provider may recommend an antiperspirant deodorant that may be gentle on your skin. A daily antiseptic wash to cleanse affected areas may be suggested by your healthcare provider. General instructions Learn as much as you can about your disease so that you have an active role in your treatment. Work closely with your health care provider to find treatments that work for you. If you are overweight, work with your health care provider to lose weight as recommended. Do not use any products that contain nicotine or tobacco. These products include cigarettes, chewing tobacco, and vaping devices, such as e-cigarettes. If you need help quitting, ask your health care provider. If you struggle with living with this condition, talk with your health care provider or work with a mental health care provider as recommended. Keep all follow-up visits. Where to find more information Hidradenitis Suppurativa Foundation, Inc.: www.hs-foundation.org American Academy of Dermatology:  infoexam.si Contact a health care provider if: You have a flare-up of hidradenitis suppurativa. You have a fever or chills. You have trouble controlling your symptoms at home. You have trouble doing your daily activities because of your symptoms. You have trouble dealing with emotional problems related to your condition. Summary Hidradenitis suppurativa is a long-term (chronic) skin disease. It is similar to a severe form of acne, but it affects areas of the body where acne would be unusual. The first symptoms are usually painful bumps in the skin, similar to pimples. The condition may only cause mild symptoms, or it may get worse over time (progress). If you have open wounds, cover them with a clean dressing as told by your health care provider. Keep wounds clean by washing them gently with soap and water  when you bathe. Besides skin care, treatment may include medicines, laser treatment, and surgery. This information is not intended to replace advice given to you by your health care provider. Make sure you discuss any questions you have with your health care provider. Document Revised: 10/11/2021 Document Reviewed: 10/11/2021 Elsevier Patient Education  2024 Elsevier Inc. Edema  Edema is when you have too much fluid in your body or under your skin. Edema may make your legs, feet, and ankles swell. Swelling often happens in looser tissues, such as around your eyes. This is a common condition. It gets more common as you get older. There are many possible causes of edema. These include: Eating too much salt (sodium). Being on your feet or sitting for a long time. Certain medical conditions, such  as: Pregnancy. Heart failure. Liver disease. Kidney disease. Cancer. Hot weather may make edema worse. Edema is usually painless. Your skin may look swollen or shiny. Follow these instructions at home: Medicines Take over-the-counter and prescription medicines only as told by your doctor. Your  doctor may prescribe a medicine to help your body get rid of extra water  (diuretic). Take this medicine if you are told to take it. Eating and drinking Eat a low-salt (low-sodium) diet as told by your doctor. Sometimes, eating less salt may reduce swelling. Depending on the cause of your swelling, you may need to limit how much fluid you drink (fluid restriction). General instructions Raise the injured area above the level of your heart while you are sitting or lying down. Do not sit still or stand for a long time. Do not wear tight clothes. Do not wear garters on your upper legs. Exercise your legs. This can help the swelling go down. Wear compression stockings as told by your doctor. It is important that these are the right size. These should be prescribed by your doctor to prevent possible injuries. If elastic bandages or wraps are recommended, use them as told by your doctor. Contact a doctor if: Treatment is not working. You have heart, liver, or kidney disease and have symptoms of edema. You have sudden and unexplained weight gain. Get help right away if: You have shortness of breath or chest pain. You cannot breathe when you lie down. You have pain, redness, or warmth in the swollen areas. You have heart, liver, or kidney disease and get edema all of a sudden. You have a fever and your symptoms get worse all of a sudden. These symptoms may be an emergency. Get help right away. Call 911. Do not wait to see if the symptoms will go away. Do not drive yourself to the hospital. Summary Edema is when you have too much fluid in your body or under your skin. Edema may make your legs, feet, and ankles swell. Swelling often happens in looser tissues, such as around your eyes. Raise the injured area above the level of your heart while you are sitting or lying down. Follow your doctor's instructions about diet and how much fluid you can drink. This information is not intended to replace  advice given to you by your health care provider. Make sure you discuss any questions you have with your health care provider. Document Revised: 04/24/2021 Document Reviewed: 04/24/2021 Elsevier Patient Education  2024 Elsevier Inc. Peripheral Edema  Peripheral edema is swelling that is caused by a buildup of fluid. Peripheral edema most often affects the lower legs, ankles, and feet. It can also develop in the arms, hands, and face. The area of the body that has peripheral edema will look swollen. It may also feel heavy or warm. Your clothes may start to feel tight. Pressing on the area may make a temporary dent in your skin (pitting edema). You may not be able to move your swollen arm or leg as much as usual. There are many causes of peripheral edema. It can happen because of a complication of other conditions such as heart failure, kidney disease, or a problem with your circulation. It also can be a side effect of certain medicines or happen because of an infection. It often happens to women during pregnancy. Sometimes, the cause is not known. Follow these instructions at home: Managing pain, stiffness, and swelling  Raise (elevate) your legs while you are sitting or lying down. Move around  often to prevent stiffness and to reduce swelling. Do not sit or stand for long periods of time. Do not wear tight clothing. Do not wear garters on your upper legs. Exercise your legs to get your circulation going. This helps to move the fluid back into your blood vessels, and it may help the swelling go down. Wear compression stockings as told by your health care provider. These stockings help to prevent blood clots and reduce swelling in your legs. It is important that these are the correct size. These stockings should be prescribed by your doctor to prevent possible injuries. If elastic bandages or wraps are recommended, use them as told by your health care provider. Medicines Take over-the-counter and  prescription medicines only as told by your health care provider. Your health care provider may prescribe medicine to help your body get rid of excess water  (diuretic). Take this medicine if you are told to take it. General instructions Eat a low-salt (low-sodium) diet as told by your health care provider. Sometimes, eating less salt may reduce swelling. Pay attention to any changes in your symptoms. Moisturize your skin daily to help prevent skin from cracking and draining. Keep all follow-up visits. This is important. Contact a health care provider if: You have a fever. You have swelling in only one leg. You have increased swelling, redness, or pain in one or both of your legs. You have drainage or sores at the area where you have edema. Get help right away if: You have edema that starts suddenly or is getting worse, especially if you are pregnant or have a medical condition. You develop shortness of breath, especially when you are lying down. You have pain in your chest or abdomen. You feel weak. You feel like you will faint. These symptoms may be an emergency. Get help right away. Call 911. Do not wait to see if the symptoms will go away. Do not drive yourself to the hospital. Summary Peripheral edema is swelling that is caused by a buildup of fluid. Peripheral edema most often affects the lower legs, ankles, and feet. Move around often to prevent stiffness and to reduce swelling. Do not sit or stand for long periods of time. Pay attention to any changes in your symptoms. Contact a health care provider if you have edema that starts suddenly or is getting worse, especially if you are pregnant or have a medical condition. Get help right away if you develop shortness of breath, especially when lying down. This information is not intended to replace advice given to you by your health care provider. Make sure you discuss any questions you have with your health care provider. Document  Revised: 04/24/2021 Document Reviewed: 04/24/2021 Elsevier Patient Education  2024 Elsevier Inc. Continuous Glucose Monitoring, Adult  Continuous glucose monitoring (CGM) allows you to check your blood sugar (blood glucose) levels at any time. The CGM system includes a sensor that attaches to your skin on your belly, arm, or under your skin. CGM sensors estimate the sugar level in the fluid between your cells, which is similar to your blood sugar. It wirelessly sends signals to a glucose monitor that records and saves the readings. CGM sensors lessen the number of times you need to do finger sticks throughout the day. However, you will still need to do finger sticks when your blood sugar is very low or very high. CGM lets you see your blood sugar levels right away. This information helps you make decisions about your diet, physical activity, and  diabetes medicine. Your health care provider may prescribe a CGM system if: You have type 1 diabetes. You have type 2 diabetes and use insulin . Your diabetes needs to be under tight control. You don't always know the warning symptoms of low blood sugar. You often have episodes of high blood sugar or low blood sugar. Options for CGM systems There are several options for CGM systems. Some include monitors that: You carry or wear. Attach to and control an insulin  pump. Send readings to your smartphone, tablet, or health care provider. Set off an alarm if you have low blood sugar or high blood sugar. Work with your provider and diabetes educator to find the best option for you. Before you start using a CGM system at home, you will be trained in how to use it. Using a CGM can help you to avoid high or low blood sugar. What are the risks? Your provider will talk to you about risks. These may include: The adhesive used by CGMs can irritate your skin or cause an allergic reaction. Some people may find that the CGM is hard to put on, keep on, or take  off. Sometimes the CGM might not work correctly or give inaccurate readings. You may need to use a fingerstick to check your blood sugar and adjust the CGM. Tips for using a CGM system: Use your device correctly by following the instructions in the guide. If the CGM sensor doesn't stick well, it might not work right. Understand how to place the CGM sensors on your skin and set up the monitor before you start using it. Replace the CGM sensor every 10 to 14 days, depending on the sensor. Work with your provider to learn about your CGM system. Make sure you are using your CGM system safely and know how to troubleshoot problems. Follow the instructions that come with your CGM for bathing, swimming, and hot tub use. CGM sensors are usually waterproof and can be left on during baths, showers, and swimming. CGM monitors are not waterproof and need to be kept dry. Follow these instructions at home: Follow your diabetes treatment plan to keep your blood sugar within your target range. Take action when the CGM alarm alerts you of high or low blood sugar levels. Check your CGM system insertion site every day for signs of infection. Check for: Redness, swelling, or pain. Fluid or blood. Warmth. Pus or a bad smell. Where to find more information [employer], Continuous Glucose Monitoring: stagesync.si Contact a health care provider if: You are not sure how to use your CGM system or how to interpret the readings. You have checked the CGM system, and you have finger sticks that don't match your monitor readings. Your CGM often alerts you of very high or very low blood sugar. You have a fever. Get help right away if: You have signs of infection at your insertion site. You have symptoms of very high or very low blood sugar that don't get better with treatment. This information is not intended to replace advice given to you by your health care provider. Make sure you discuss any  questions you have with your health care provider. Document Revised: 04/02/2023 Document Reviewed: 07/23/2022 Elsevier Patient Education  2024 Elsevier Inc.  The above assessment and management plan was discussed with the patient. The patient verbalized understanding of and has agreed to the management plan. Patient is aware to call the clinic if they develop any new symptoms or if symptoms persist or worsen.  Patient is aware when to return to the clinic for a follow-up visit. Patient educated on when it is appropriate to go to the emergency department.   Shanecia Hoganson [place] Thompson, DNP [facility] [address] [place], [place] [address] [phone]       [1] No Known Allergies  "

## 2024-09-17 ENCOUNTER — Ambulatory Visit: Payer: Self-pay

## 2024-09-17 NOTE — Telephone Encounter (Signed)
 Pt informed that Brittany Wade will not be back until Monday. No medication can be prescribed by another provider without being seen again. Pt declined visit. LS

## 2024-09-17 NOTE — Telephone Encounter (Signed)
 FYI Only or Action Required?: Action required by provider: requesting pain med.  Patient was last seen in primary care on 09/14/2024 by Deitra Morton Sebastian Nena, NP.  Called Nurse Triage reporting No chief complaint on file..  Symptoms began several weeks ago.  Interventions attempted: OTC medications: ibuprofen  600.  Symptoms are: gradually worsening.  Triage Disposition: No disposition on file.  Patient/caregiver understands and will follow disposition?:    Summary: Mouth pain   Reason for Triage: Mouth pain, current medication not working. Would like a stronger medication.         Reason for Disposition  Toothache present > 24 hours  Answer Assessment - Initial Assessment Questions States ibuprofen  600 is not helping. Wants something stronger   1. LOCATION: Which tooth is hurting?  (e.g., right-side/left-side, upper/lower, front/back)     All 2. ONSET: When did the toothache start?  (e.g., hours, days)      weeks 3. SEVERITY: How bad is the toothache?  (Scale 1-10; mild, moderate or severe)     moderate 4. SWELLING: Is there any visible swelling of your face?     No changes since last visit 5. OTHER SYMPTOMS: Do you have any other symptoms? (e.g., fever)      6. PREGNANCY: Is there any chance you are pregnant? When was your last menstrual period?  Protocols used: Toothache-A-AH

## 2024-10-08 ENCOUNTER — Other Ambulatory Visit: Payer: Self-pay | Admitting: Nurse Practitioner

## 2024-10-08 DIAGNOSIS — R600 Localized edema: Secondary | ICD-10-CM

## 2024-11-19 ENCOUNTER — Encounter: Payer: Self-pay | Admitting: Family Medicine
